# Patient Record
Sex: Female | Born: 1965
Health system: Southern US, Community
[De-identification: ages and names within clinical notes are randomized; demographics above are authoritative.]

## PROBLEM LIST (undated history)

## (undated) DIAGNOSIS — E039 Hypothyroidism, unspecified: Secondary | ICD-10-CM

## (undated) DIAGNOSIS — D860 Sarcoidosis of lung: Secondary | ICD-10-CM

## (undated) DIAGNOSIS — E785 Hyperlipidemia, unspecified: Secondary | ICD-10-CM

## (undated) DIAGNOSIS — C3411 Malignant neoplasm of upper lobe, right bronchus or lung: Secondary | ICD-10-CM

## (undated) DIAGNOSIS — T7840XA Allergy, unspecified, initial encounter: Secondary | ICD-10-CM

## (undated) DIAGNOSIS — Z8619 Personal history of other infectious and parasitic diseases: Secondary | ICD-10-CM

## (undated) DIAGNOSIS — I10 Essential (primary) hypertension: Secondary | ICD-10-CM

## (undated) HISTORY — DX: Essential (primary) hypertension: I10

## (undated) HISTORY — DX: Hyperlipidemia, unspecified: E78.5

## (undated) HISTORY — DX: Personal history of other infectious and parasitic diseases: Z86.19

## (undated) HISTORY — DX: Hypothyroidism, unspecified: E03.9

## (undated) HISTORY — DX: Allergy, unspecified, initial encounter: T78.40XA

## (undated) HISTORY — DX: Other disorders of bilirubin metabolism: E80.6

## (undated) HISTORY — DX: Malignant neoplasm of upper lobe, right bronchus or lung: C34.11

---

## 1996-08-02 HISTORY — PX: REFRACTIVE SURGERY: SHX103

## 1998-10-30 ENCOUNTER — Other Ambulatory Visit: Admission: RE | Admit: 1998-10-30 | Discharge: 1998-10-30 | Payer: Self-pay | Admitting: Obstetrics and Gynecology

## 1999-06-02 ENCOUNTER — Other Ambulatory Visit: Admission: RE | Admit: 1999-06-02 | Discharge: 1999-06-02 | Payer: Self-pay | Admitting: Obstetrics and Gynecology

## 1999-08-03 HISTORY — PX: TUBAL LIGATION: SHX77

## 1999-11-28 ENCOUNTER — Encounter (INDEPENDENT_AMBULATORY_CARE_PROVIDER_SITE_OTHER): Payer: Self-pay

## 1999-11-28 ENCOUNTER — Inpatient Hospital Stay (HOSPITAL_COMMUNITY): Admission: AD | Admit: 1999-11-28 | Discharge: 1999-11-30 | Payer: Self-pay | Admitting: Obstetrics and Gynecology

## 1999-12-14 ENCOUNTER — Encounter: Admission: RE | Admit: 1999-12-14 | Discharge: 2000-03-13 | Payer: Self-pay | Admitting: Obstetrics and Gynecology

## 2000-01-04 ENCOUNTER — Other Ambulatory Visit: Admission: RE | Admit: 2000-01-04 | Discharge: 2000-01-04 | Payer: Self-pay | Admitting: Obstetrics and Gynecology

## 2000-07-12 ENCOUNTER — Other Ambulatory Visit: Admission: RE | Admit: 2000-07-12 | Discharge: 2000-07-12 | Payer: Self-pay | Admitting: Obstetrics and Gynecology

## 2001-02-15 ENCOUNTER — Other Ambulatory Visit: Admission: RE | Admit: 2001-02-15 | Discharge: 2001-02-15 | Payer: Self-pay | Admitting: Obstetrics and Gynecology

## 2001-08-11 ENCOUNTER — Other Ambulatory Visit: Admission: RE | Admit: 2001-08-11 | Discharge: 2001-08-11 | Payer: Self-pay | Admitting: Obstetrics and Gynecology

## 2002-03-12 ENCOUNTER — Other Ambulatory Visit: Admission: RE | Admit: 2002-03-12 | Discharge: 2002-03-12 | Payer: Self-pay | Admitting: Obstetrics and Gynecology

## 2003-01-11 ENCOUNTER — Observation Stay (HOSPITAL_COMMUNITY): Admission: RE | Admit: 2003-01-11 | Discharge: 2003-01-12 | Payer: Self-pay | Admitting: Internal Medicine

## 2003-02-28 ENCOUNTER — Ambulatory Visit (HOSPITAL_COMMUNITY): Admission: RE | Admit: 2003-02-28 | Discharge: 2003-02-28 | Payer: Self-pay | Admitting: Internal Medicine

## 2003-03-15 ENCOUNTER — Other Ambulatory Visit: Admission: RE | Admit: 2003-03-15 | Discharge: 2003-03-15 | Payer: Self-pay | Admitting: Obstetrics and Gynecology

## 2003-10-18 ENCOUNTER — Encounter: Admission: RE | Admit: 2003-10-18 | Discharge: 2003-10-18 | Payer: Self-pay | Admitting: Obstetrics and Gynecology

## 2004-04-17 ENCOUNTER — Other Ambulatory Visit: Admission: RE | Admit: 2004-04-17 | Discharge: 2004-04-17 | Payer: Self-pay | Admitting: Obstetrics and Gynecology

## 2005-05-21 ENCOUNTER — Other Ambulatory Visit: Admission: RE | Admit: 2005-05-21 | Discharge: 2005-05-21 | Payer: Self-pay | Admitting: Obstetrics and Gynecology

## 2009-01-30 HISTORY — PX: ENDOMETRIAL ABLATION W/ NOVASURE: SUR434

## 2009-02-20 ENCOUNTER — Ambulatory Visit (HOSPITAL_COMMUNITY): Admission: RE | Admit: 2009-02-20 | Discharge: 2009-02-20 | Payer: Self-pay | Admitting: Obstetrics and Gynecology

## 2009-02-20 ENCOUNTER — Encounter (INDEPENDENT_AMBULATORY_CARE_PROVIDER_SITE_OTHER): Payer: Self-pay | Admitting: Obstetrics and Gynecology

## 2010-11-08 LAB — CBC
Platelets: 248 10*3/uL (ref 150–400)
RBC: 4.79 MIL/uL (ref 3.87–5.11)
WBC: 8 10*3/uL (ref 4.0–10.5)

## 2010-11-08 LAB — HCG, SERUM, QUALITATIVE: Preg, Serum: NEGATIVE

## 2010-12-15 NOTE — H&P (Signed)
NAME:  Monica [name], Monica [name] NO.:  [id number]   MEDICAL RECORD NO.:  [id number]          PATIENT TYPE:  AMB   LOCATION:  SDC                           FACILITY:  [facility]   PHYSICIAN:  Juluis Mire, M.D.   DATE OF BIRTH:  03-21-1966   DATE OF ADMISSION:  02/20/2009  DATE OF DISCHARGE:                              HISTORY & PHYSICAL   The patient is a 45 year old, gravida 1, para 1, female presents for  hysteroscopy.   In relation to the present admission, the patient has had trouble with  increasing menstrual flow, this become extremely limiting.  She  underwent a saline infusion ultrasound in the office.  She had a small  endometrial polyp that was noted.  Otherwise, findings suggestive of  adenomyosis.  Both ovaries appeared to be normal.  We had discussed  various options for management of menorrhagia.  After discussion of  options, she would like to proceed with hysteroscopy to resect the polyp  and then a NovaSure ablation which she is admitted at the present time.  She has had a previous bilateral tubal ligation.   IN TERMS OF ALLERGIES:  No known drug allergies.   MEDICATIONS:  She is on Synthroid 75 mcg daily.   PAST MEDICAL HISTORY:  Usual childhood diseases.  No significant  sequelae.  She has had a previous vaginal delivery and bilateral tubal  ligation.   SOCIAL HISTORY:  No tobacco or alcohol use.   FAMILY HISTORY:  There is a family history of hepatitis and bone cancer  as well as breast cancer.   REVIEW OF SYSTEMS:  Noncontributory.   PHYSICAL EXAMINATION:  VITAL SIGNS:  Stable.  The patient is afebrile.  HEENT: The patient is normocephalic.  Pupils are equal, round, reactive  to light and accommodation.  Extraocular movements were intact.  Sclerae  and conjunctivae are clear.  Oropharynx is clear.  NECK:  Without thyromegaly.  BREASTS:  Not examined.  LUNGS:  Clear.  CARDIOVASCULAR:  Regular rate without murmurs or gallops.  ABDOMEN:  Benign.   No mass, organomegaly, or tenderness.  PELVIC:  Normal external genitalia.  Vaginal mucosa is clear.  Cervix  unremarkable, usual size, shape, and contour.  Adnexa free of mass or  tenderness.  EXTREMITIES:  Trace edema.   IMPRESSION:  1. Menorrhagia.  2. Small endometrial polyp.  3. Hypothyroidism.   PLAN:  After discussion of options, the patient now presents for  hysteroscopy, we will remove the polyp and proceed with NovaSure  ablation.  The risk of procedure have been discussed including the risk  of infection.  Risk of hemorrhage that could require transfusion with  the risk of AIDS or hepatitis.  Excessive bleeding could require  hysterectomy.  There is a risk of perforation lead to injury to adjacent  organs requiring further exploratory surgery.  Risk of deep venous  thrombosis and pulmonary embolus with the NovaSure ablation obvious or  major concern would be perforation and injury to adjacent organs as  listed above.  Success rates of 80% are quoted.  The patient understand  indications, risks, and alternatives.  Juluis Mire, M.D.  Electronically Signed     JSM/MEDQ  D:  02/20/2009  T:  02/20/2009  Job:  [id number]

## 2010-12-15 NOTE — Op Note (Signed)
Monica [name], Monica [name]             ACCOUNT NO.:  [id number]   MEDICAL RECORD NO.:  [id number]          PATIENT TYPE:  AMB   LOCATION:  SDC                           FACILITY:  [facility]   PHYSICIAN:  Juluis Mire, M.D.   DATE OF BIRTH:  1965/10/13   DATE OF PROCEDURE:  DATE OF DISCHARGE:                               OPERATIVE REPORT   PREOPERATIVE DIAGNOSES:  Menorrhagia.  Endometrial polyp.   POSTOPERATIVE DIAGNOSES:  Menorrhagia.  Endometrial polyp.   PROCEDURES:  Paracervical block.  Hysteroscopy with resection of polyp,  endometrial curettings.  NovaSure ablation.   SURGEON:  Juluis Mire, MD.   ANESTHESIA:  General and paracervical block.   ESTIMATED BLOOD LOSS:  Minimal.   PACKS AND DRAINS:  None.   INTRAOPERATIVE BLOOD PLACED:  None.   COMPLICATIONS:  None.   INDICATIONS:  Dictated in the history and physical.   PROCEDURE IN DETAILS:  The patient was taken to the OR, placed supine  position.  After satisfactory level of general anesthesia was obtained,  the patient was placed in dorsal lithotomy position using the Allen  stirrups.  The perineum and vagina were cleansed out with Betadine and  draped in sterile field.  Speculum was placed in the vaginal vault.  The  cervix was grasped with single-tooth tenaculum.  Paracervical block with  1% Nesacaine was instituted.  Uterus sounded to 8 cm.  Endocervical  length was 3.5 cm, giving [place] a cavity length of 4.5 cm.  Hysteroscope  was then introduced.  Visualization revealed a posterior wall polyp.  This was resected.  We kept the resection fairly flush to the uterine  cavity.  Once the polyp was resected, this was sent to Pathology.  We  did do endometrial curettings.  The intrauterine cavity was then  cleansed out with lactated Ringer for 2 minutes.  We made sure all the  sorbitol had been removed.  NovaSure was brought in place, properly  deployed.  Cavity length was 4.5 cm.  We passed the CO2 patency test.  Ablation  was undertaken at a power setting of 111 per minute 30 seconds.  The NovaSure was removed intact.  Hysteroscopy revealed good ablation of  the endometrial cavity.  No signs of  perforation.  Single-tooth tenaculum and speculum then removed.  The  patient was taken out of the dorsal lithotomy position.  Once alert,  transferred to recovery room in good condition.  Sponges, instrument,  and needle count reported as correct by circulating nurse.      Juluis Mire, M.D.  Electronically Signed     JSM/MEDQ  D:  02/20/2009  T:  02/20/2009  Job:  [id number]

## 2010-12-18 NOTE — H&P (Signed)
NAMEGEORGENE, KOPPER NO.:  [id number]   MEDICAL RECORD NO.:  [id number]                   PATIENT TYPE:  OBV   LOCATION:  5709                                 FACILITY:  [facility]   PHYSICIAN:  Juline Patch, M.D.                  DATE OF BIRTH:  [birth year]   DATE OF ADMISSION:  01/11/2003  DATE OF DISCHARGE:  01/12/2003                                HISTORY & PHYSICAL   HISTORY OF PRESENT ILLNESS:  This is a 45 year old white female who comes in  to my office complaining of bilateral feet swelling and chest  pressure/tightness with some exertion.  No radiation.  No diaphoresis.  She  also felt some subsequent shortness of breath and nausea.  She says that  when she takes a deep breath she complains of this chest discomfort.  The  patient was seen in my office approximately three days ago and was put on  Lasix for leg swelling.  She reportedly had had negative blood work at her  previous physician's office.  She was scheduled for an echocardiogram.   PAST MEDICAL HISTORY:  None.   MEDICATIONS:  Hydrochlorothiazide/triamterene once a day.   PAST SURGICAL HISTORY:  Vaginal delivery, bilateral tubal ligation.   SOCIAL HISTORY:  She does not smoke or drink alcohol.  She is married with  one child.   FAMILY HISTORY:  Mother had bone cancer and questionable breast cancer at  age 65.   REVIEW OF SYSTEMS:  Denies any fever, weight loss, night sweats, joint pain,  palpitations, heartburn, blood in her stools, black stools, increasing  frequency, rash or skin lesions, depression, anxiety, panic attacks, heat or  cold intolerance.   PHYSICAL EXAMINATION:  VITAL SIGNS:  Blood pressure was 140/90, pulse 96.  She was afebrile.  HEENT:  Pupils equal, round and reactive.  __________ is clear.  Throat  clear.  NECK:  No lymphadenopathy.  LUNGS:  Clear.  No wheezing or rales.  HEART:  S1 and S2.  ABDOMEN:  Soft, nontender.  EXTREMITIES:  Bilateral 1+ ankle  edema with erythema around the malleolus.  No evidence of calf tenderness.   EKG shows normal sinus rhythm with deep S and Q wave inversion in lead III.  She is 97% on room air.   ASSESSMENT AND PLAN:  Because of her history of leg swelling and shortness  of breath on exertion and suspicious EKG, will send her for a spiral CT, and  admit her for observation to remove this leg swelling and to get to the  bottom of it.  She was started on Lasix IV, as she failed outpatient  therapy.  Will repeat some of her blood work and give her one shot of  Lovenox until her spiral CT is done.  Juline Patch, M.D.   RP/MEDQ  D:  01/14/2003  T:  01/14/2003  Job:  [id number]

## 2010-12-18 NOTE — Op Note (Signed)
[facility]  Patient:    Monica [name], Monica [name]                      MRN: [id number] Adm. Date:  [id number] Attending:  Trevor Iha                           Operative Report  PREOPERATIVE DIAGNOSIS:       Requests permanent sterilization.  POSTOPERATIVE DIAGNOSIS:      Requests permanent sterilization.  PROCEDURE:                    Modified [place] postpartum tubal ligation.  SURGEON:                      Duke Salvia. Marcelle Overlie, M.D.  ANESTHESIA:                   Epidural.  COMPLICATIONS:                None.  DRAINS:                       In-and-out catheter.  BLOOD LOSS:                   Less than 5 cc.  PROCEDURE AND FINDINGS:       Patient was taken to the operating room and after an adequate level of epidural anesthesia was obtained, patient supine, the abdomen was prepped and draped in the usual manner for sterile abdominal procedures.  A 2-cm subumbilical incision was made and carried down individually to the fascia and peritoneum without difficulty.  Army-Navy retractors were positioned.  Babcock clamps were then used to trace the right tube from the cornu to the fimbriated nd. This was regrasped at mid-segment.  A 0 plain suture was then tied around each nd of a mid-segment knuckle of tube, which was then excised, cut ends cauterized with a Bovie and a 2-0 silk suture placed around the proximal segment; the exact same repeated on the opposite side after carefully identifying the tube.  Prior to closure, sponge, needle and instrument counts were reported as correct x 2. The operative sites were hemostatic on either side.  Fascia was closed with a running 2-0 Dexon suture; 4-0 Dexon and subcuticular on the skin.  She tolerated this well and went to recovery room in good condition. DD:  11/29/99 TD:  11/30/99 Job: [id number]

## 2010-12-18 NOTE — Discharge Summary (Signed)
   NAMETABOR, BARTRAM NO.:  [id number]   MEDICAL RECORD NO.:  [id number]                   PATIENT TYPE:  OBV   LOCATION:  5709                                 FACILITY:  [facility]   PHYSICIAN:  Juline Patch, M.D.                  DATE OF BIRTH:  1965-10-08   DATE OF ADMISSION:  01/11/2003  DATE OF DISCHARGE:  01/12/2003                                 DISCHARGE SUMMARY   DISCHARGE DIAGNOSIS:  Bilateral hilar adenopathy, questionable sarcoidosis.   HISTORY:  This 45 year old patient was admitted for bilateral leg swelling  and increasing shortness of breath.  She had a high resolution CT which  showed mediastinal left hilar adenopathy with questionable right hilar  adenopathy and enlarged lymph nodes also in the left hilum.  In the anterior  mediastinum, there is a largest node measuring 125 x 2.8.  The patient was  given IV Lasix and antibiotics during her 24-hour observation and improved  dramatically with her swelling gone as well as erythema.  She was seen by  pulmonary consult who recommended that the patient have a followup CT in 4-6  weeks and be seen by a rheumatologist.  There was no followup scheduled by  the pulmonologist.  The patient was comfortable going home.  She was sent  home with prednisone taper and Keflex.   FOLLOW UP:  Dr. Ricki Miller in one week.   DISPOSITION:  Home.   CONDITION ON DISCHARGE:  Fair.                                               Juline Patch, M.D.    RP/MEDQ  D:  01/14/2003  T:  01/14/2003  Job:  [id number]

## 2013-12-10 ENCOUNTER — Encounter (HOSPITAL_COMMUNITY): Payer: Self-pay | Admitting: Emergency Medicine

## 2013-12-10 ENCOUNTER — Emergency Department (HOSPITAL_COMMUNITY)
Admission: EM | Admit: 2013-12-10 | Discharge: 2013-12-10 | Disposition: A | Discharge status: Home / Self Care | Payer: BC Managed Care – PPO | Attending: Emergency Medicine | Admitting: Emergency Medicine

## 2013-12-10 DIAGNOSIS — M549 Dorsalgia, unspecified: Secondary | ICD-10-CM

## 2013-12-10 DIAGNOSIS — Z3202 Encounter for pregnancy test, result negative: Secondary | ICD-10-CM | POA: Insufficient documentation

## 2013-12-10 DIAGNOSIS — R110 Nausea: Secondary | ICD-10-CM | POA: Insufficient documentation

## 2013-12-10 LAB — URINALYSIS, ROUTINE W REFLEX MICROSCOPIC
BILIRUBIN URINE: NEGATIVE
Glucose, UA: NEGATIVE mg/dL
Ketones, ur: NEGATIVE mg/dL
Nitrite: NEGATIVE
PROTEIN: NEGATIVE mg/dL
Specific Gravity, Urine: 1.013 (ref 1.005–1.030)
UROBILINOGEN UA: 0.2 mg/dL (ref 0.0–1.0)
pH: 5.5 (ref 5.0–8.0)

## 2013-12-10 LAB — COMPREHENSIVE METABOLIC PANEL
ALT: 10 U/L (ref 0–35)
AST: 13 U/L (ref 0–37)
Albumin: 4 g/dL (ref 3.5–5.2)
Alkaline Phosphatase: 62 U/L (ref 39–117)
BILIRUBIN TOTAL: 1.1 mg/dL (ref 0.3–1.2)
BUN: 16 mg/dL (ref 6–23)
CHLORIDE: 104 meq/L (ref 96–112)
CO2: 23 meq/L (ref 19–32)
CREATININE: 0.64 mg/dL (ref 0.50–1.10)
Calcium: 9.2 mg/dL (ref 8.4–10.5)
GFR calc Af Amer: >90 mL/min (ref >90)
GLUCOSE: 104 mg/dL — AB (ref 70–99)
Potassium: 4.1 mEq/L (ref 3.7–5.3)
SODIUM: 141 meq/L (ref 137–147)
Total Protein: 7.2 g/dL (ref 6.0–8.3)

## 2013-12-10 LAB — CBC WITH DIFFERENTIAL/PLATELET
BASOS ABS: 0 10*3/uL (ref 0.0–0.1)
Basophils Relative: 0 % (ref 0–1)
Eosinophils Absolute: 0.2 10*3/uL (ref 0.0–0.7)
Eosinophils Relative: 3 % (ref 0–5)
HEMATOCRIT: 43.7 % (ref 36.0–46.0)
Hemoglobin: 15 g/dL (ref 12.0–15.0)
LYMPHS ABS: 1.4 10*3/uL (ref 0.7–4.0)
LYMPHS PCT: 23 % (ref 12–46)
MCH: 28.5 pg (ref 26.0–34.0)
MCHC: 34.3 g/dL (ref 30.0–36.0)
MCV: 83.1 fL (ref 78.0–100.0)
MONO ABS: 0.3 10*3/uL (ref 0.1–1.0)
Monocytes Relative: 5 % (ref 3–12)
NEUTROS ABS: 4.2 10*3/uL (ref 1.7–7.7)
Neutrophils Relative %: 69 % (ref 43–77)
Platelets: 242 10*3/uL (ref 150–400)
RBC: 5.26 MIL/uL — AB (ref 3.87–5.11)
RDW: 13 % (ref 11.5–15.5)
WBC: 6 10*3/uL (ref 4.0–10.5)

## 2013-12-10 LAB — POC URINE PREG, ED: Preg Test, Ur: NEGATIVE

## 2013-12-10 LAB — URINE MICROSCOPIC-ADD ON

## 2013-12-10 MED ORDER — ONDANSETRON 4 MG PO TBDP
8.0000 mg | ORAL_TABLET | Freq: Once | ORAL | Status: AC
  Administered 2013-12-10: 8 mg via ORAL
  Filled 2013-12-10: qty 2

## 2013-12-10 MED ORDER — IBUPROFEN 800 MG PO TABS
800.0000 mg | ORAL_TABLET | Freq: Once | ORAL | Status: AC
  Administered 2013-12-10: 800 mg via ORAL
  Filled 2013-12-10: qty 1

## 2013-12-10 NOTE — ED Provider Notes (Signed)
Medical screening examination/treatment/procedure(s) were performed by non-physician practitioner and as supervising physician I was immediately available for consultation/collaboration.   EKG Interpretation None        Ezequiel Essex, MD 12/10/13 [phone]

## 2013-12-10 NOTE — Discharge Instructions (Signed)
Back Pain, Adult Low back pain is very common. About 1 in 5 people have back pain.The cause of low back pain is rarely dangerous. The pain often gets better over time.About half of people with a sudden onset of back pain feel better in just 2 weeks. About 8 in 10 people feel better by 6 weeks.  CAUSES Some common causes of back pain include:  Strain of the muscles or ligaments supporting the spine.  Wear and tear (degeneration) of the spinal discs.  Arthritis.  Direct injury to the back. DIAGNOSIS Most of the time, the direct cause of low back pain is not known.However, back pain can be treated effectively even when the exact cause of the pain is unknown.Answering your caregiver's questions about your overall health and symptoms is one of the most accurate ways to make sure the cause of your pain is not dangerous. If your caregiver needs more information, he or she may order lab work or imaging tests (X-rays or MRIs).However, even if imaging tests show changes in your back, this usually does not require surgery. HOME CARE INSTRUCTIONS For many people, back pain returns.Since low back pain is rarely dangerous, it is often a condition that people can learn to manageon their own.   Remain active. It is stressful on the back to sit or stand in one place. Do not sit, drive, or stand in one place for more than 30 minutes at a time. Take short walks on level surfaces as soon as pain allows.Try to increase the length of time you walk each day.  Do not stay in bed.Resting more than 1 or 2 days can delay your recovery.  Do not avoid exercise or work.Your body is made to move.It is not dangerous to be active, even though your back may hurt.Your back will likely heal faster if you return to being active before your pain is gone.  Pay attention to your body when you bend and lift. Many people have less discomfortwhen lifting if they bend their knees, keep the load close to their bodies,and  avoid twisting. Often, the most comfortable positions are those that put less stress on your recovering back.  Find a comfortable position to sleep. Use a firm mattress and lie on your side with your knees slightly bent. If you lie on your back, put a pillow under your knees.  Only take over-the-counter or prescription medicines as directed by your caregiver. Over-the-counter medicines to reduce pain and inflammation are often the most helpful.Your caregiver may prescribe muscle relaxant drugs.These medicines help dull your pain so you can more quickly return to your normal activities and healthy exercise.  Put ice on the injured area.  Put ice in a plastic bag.  Place a towel between your skin and the bag.  Leave the ice on for 15-20 minutes, 03-04 times a day for the first 2 to 3 days. After that, ice and heat may be alternated to reduce pain and spasms.  Ask your caregiver about trying back exercises and gentle massage. This may be of some benefit.  Avoid feeling anxious or stressed.Stress increases muscle tension and can worsen back pain.It is important to recognize when you are anxious or stressed and learn ways to manage it.Exercise is a great option. SEEK MEDICAL CARE IF:  You have pain that is not relieved with rest or medicine.  You have pain that does not improve in 1 week.  You have new symptoms.  You are generally not feeling well. SEEK   IMMEDIATE MEDICAL CARE IF:   You have pain that radiates from your back into your legs.  You develop new bowel or bladder control problems.  You have unusual weakness or numbness in your arms or legs.  You develop nausea or vomiting.  You develop abdominal pain.  You feel faint. Document Released: 07/19/2005 Document Revised: 01/18/2012 Document Reviewed: 12/07/2010 Exit[employer] Patient Information 2014 [employer].  

## 2013-12-10 NOTE — ED Notes (Signed)
PA at bedside.

## 2013-12-10 NOTE — ED Notes (Signed)
48 yo female with Right Back/Flank pain that radiates around to the front. Reports having back pain before but not like this. No HX of kidney stones. Nauseous with pain.

## 2013-12-10 NOTE — ED Provider Notes (Signed)
CSN: [id number]     Arrival date & time 12/10/13  0710 History   First MD Initiated Contact with Patient 12/10/13 [phone]     Chief Complaint  Patient presents with  . Back Pain    Right Sided     (Consider location/radiation/quality/duration/timing/severity/associated sxs/prior Treatment) HPI Comments: Patient presents to the ED with a chief complaint of back pain.  She states that the pain has been ongoing for the past couple of weeks, but has recently worsened.  She normally takes advil for pain.  Denies bowel or bladder incontinence, saddle anesthesia, difficulty walking, ataxia, fevers or chills, and hematuria.  No known MOI.  States that she wants to get in to see an orthopedic, but wanted to make certain it wasn't her kidneys or gallbladder.  She works as a Art therapist.  The history is provided by the patient. No language interpreter was used.    History reviewed. No pertinent past medical history. No past surgical history on file. No family history on file. History  Substance Use Topics  . Smoking status: Not on file  . Smokeless tobacco: Not on file  . Alcohol Use: Not on file   OB History   Grav Para Term Preterm Abortions TAB SAB Ect Mult Living                 Review of Systems  Constitutional: Negative for fever and chills.  Respiratory: Negative for shortness of breath.   Cardiovascular: Negative for chest pain.  Gastrointestinal: Positive for nausea. Negative for vomiting, diarrhea and constipation.  Genitourinary: Negative for dysuria.  Musculoskeletal: Positive for back pain.      Allergies  Review of patient's allergies indicates not on file.  Home Medications   Prior to Admission medications   Not on File   There were no vitals taken for this visit. Physical Exam  Nursing note and vitals reviewed. Constitutional: She is oriented to person, place, and time. She appears well-developed and well-nourished. No distress.  HENT:  Head: Normocephalic  and atraumatic.  Eyes: Conjunctivae and EOM are normal. Pupils are equal, round, and reactive to light. Right eye exhibits no discharge. Left eye exhibits no discharge. No scleral icterus.  Neck: Normal range of motion. Neck supple. No tracheal deviation present.  Cardiovascular: Normal rate, regular rhythm and normal heart sounds.  Exam reveals no gallop and no friction rub.   No murmur heard. Pulmonary/Chest: Effort normal and breath sounds normal. No respiratory distress. She has no wheezes. She has no rales. She exhibits no tenderness.  Abdominal: Soft. She exhibits no distension and no mass. There is no tenderness. There is no rebound and no guarding.  No focal abdominal tenderness, no RLQ tenderness or pain at McBurney's point, no RUQ tenderness or Murphy's sign, no left-sided abdominal tenderness, no fluid wave, or signs of peritonitis   Musculoskeletal: Normal range of motion. She exhibits no edema and no tenderness.  No paraspinal muscles tenderness, no bony tenderness, step-offs, or gross abnormality or deformity of spine, patient is able to ambulate, moves all extremities  Bilateral great toe extension intact Bilateral plantar/dorsiflexion intact  Neurological: She is alert and oriented to person, place, and time. She has normal reflexes.  Sensation and strength intact bilaterally Symmetrical reflexes  Skin: Skin is warm and dry. She is not diaphoretic.  Psychiatric: She has a normal mood and affect. Her behavior is normal. Judgment and thought content normal.    ED Course  Procedures (including critical care time) Labs Review  Labs Reviewed - No data to display  Imaging Review No results found.   EKG Interpretation None      MDM   Final diagnoses:  None    Patient with back pain. Suspect musculoskeletal, however patient is concerned about kidney and gallbladder.  Will check labs.    Labs are reassuring.  No RUQ pain.  Will culture urine, but doubt infection.  No  dysuria. Patient states that she has had some constipation.  I recommend increasing fluids and high fiber diet. Abdomen is benign on exam.  Will give orthopedic referral.  Patient appears comfortable.  9:06 AM Patient's pain is 0/10.  Afebrile.  Benign abdomen.  Will discharge to home with orthopedic follow-up.  Filed Vitals:   12/10/13 0719  BP: 164/91  Pulse: 93  Temp: 97.9 F (36.6 C)  Resp: [address], [place] 12/10/13 [phone]

## 2013-12-11 LAB — URINE CULTURE

## 2014-02-18 ENCOUNTER — Other Ambulatory Visit: Payer: Self-pay | Admitting: Internal Medicine

## 2014-02-18 DIAGNOSIS — R1903 Right lower quadrant abdominal swelling, mass and lump: Secondary | ICD-10-CM

## 2014-02-20 ENCOUNTER — Other Ambulatory Visit: Payer: BC Managed Care – PPO

## 2014-02-21 ENCOUNTER — Ambulatory Visit
Admission: RE | Admit: 2014-02-21 | Discharge: 2014-02-21 | Disposition: A | Discharge status: Home / Self Care | Payer: BC Managed Care – PPO | Source: Ambulatory Visit | Attending: Internal Medicine | Admitting: Internal Medicine

## 2014-02-21 DIAGNOSIS — R1903 Right lower quadrant abdominal swelling, mass and lump: Secondary | ICD-10-CM

## 2014-07-12 ENCOUNTER — Other Ambulatory Visit: Payer: Self-pay | Admitting: Obstetrics and Gynecology

## 2014-07-15 LAB — CYTOLOGY - PAP

## 2014-10-10 ENCOUNTER — Ambulatory Visit (INDEPENDENT_AMBULATORY_CARE_PROVIDER_SITE_OTHER): Payer: BLUE CROSS/BLUE SHIELD | Admitting: Physician Assistant

## 2014-10-10 ENCOUNTER — Ambulatory Visit (INDEPENDENT_AMBULATORY_CARE_PROVIDER_SITE_OTHER): Payer: BLUE CROSS/BLUE SHIELD

## 2014-10-10 VITALS — BP 140/88 | HR 113 | Temp 99.1°F | Resp 19 | Ht 60.5 in | Wt 192.8 lb

## 2014-10-10 DIAGNOSIS — R05 Cough: Secondary | ICD-10-CM

## 2014-10-10 DIAGNOSIS — E039 Hypothyroidism, unspecified: Secondary | ICD-10-CM | POA: Insufficient documentation

## 2014-10-10 DIAGNOSIS — R053 Chronic cough: Secondary | ICD-10-CM

## 2014-10-10 DIAGNOSIS — J019 Acute sinusitis, unspecified: Secondary | ICD-10-CM

## 2014-10-10 DIAGNOSIS — Z862 Personal history of diseases of the blood and blood-forming organs and certain disorders involving the immune mechanism: Secondary | ICD-10-CM | POA: Insufficient documentation

## 2014-10-10 LAB — POCT CBC
GRANULOCYTE PERCENT: 79.1 % (ref 37–80)
HCT, POC: 41.1 % (ref 37.7–47.9)
HEMOGLOBIN: 13.2 g/dL (ref 12.2–16.2)
Lymph, poc: 1.1 (ref 0.6–3.4)
MCH: 27.3 pg (ref 27–31.2)
MCHC: 32.3 g/dL (ref 31.8–35.4)
MCV: 84.5 fL (ref 80–97)
MID (cbc): 0.6 (ref 0–0.9)
MPV: 7.4 fL (ref 0–99.8)
PLATELET COUNT, POC: 237 10*3/uL (ref 142–424)
POC GRANULOCYTE: 6.3 (ref 2–6.9)
POC LYMPH PERCENT: 13.4 %L (ref 10–50)
POC MID %: 7.5 %M (ref 0–12)
RBC: 4.86 M/uL (ref 4.04–5.48)
RDW, POC: 13.4 %
WBC: 8 10*3/uL (ref 4.6–10.2)

## 2014-10-10 MED ORDER — LEVOFLOXACIN 500 MG PO TABS: 500.0000 mg | ORAL_TABLET | Freq: Every day | ORAL | Status: DC

## 2014-10-10 MED ORDER — PREDNISONE 20 MG PO TABS: ORAL_TABLET | ORAL | Status: DC

## 2014-10-10 NOTE — Patient Instructions (Signed)
Take antibiotic once a day for 7 days. Take prednisone in the mornings for 3 days. Follow up with your PCP if your symptoms continue to not improve.

## 2014-10-10 NOTE — Progress Notes (Signed)
Subjective:    Patient ID: Monica [name], female    DOB: 11-05-1965, 49 y.o.   MRN: [id number]  HPI  This is a 49 year old female presenting with 4 weeks of cough. Went to see PCP and was dx'd with bronchitis. Was given codeine cough syrup and azithromycin. Finished the abx 2 weeks ago. Cough is now dry, was productive. Has had a few episodes of post-tussive emesis. Husband was sick at the same time but is better now. For the past 1 week has developed nasal congestion, sinus pressure and achy otalgia. Had fever and chills last night. Been trying sudafed, ibuprofen, robitussin and nasacort. No SOB, wheezing. Reports she was treated for sarcoidosis 11 years ago but no problems since. She is not a smoker. States she has problems with heartburn if she eats late at night, but otherwise no problems. Had seasonal allergies when younger, not as much trouble in adulthood.   Review of Systems  Constitutional: Positive for chills. Negative for fever.  HENT: Positive for congestion, ear pain and sinus pressure. Negative for sore throat.   Eyes: Negative for redness.  Respiratory: Positive for cough. Negative for shortness of breath and wheezing.   Gastrointestinal: Positive for vomiting. Negative for nausea, abdominal pain and diarrhea.  Skin: Negative for rash.  Allergic/Immunologic: Positive for environmental allergies (occasional).  Neurological: Negative for dizziness.  Hematological: Negative for adenopathy.  Psychiatric/Behavioral: Positive for sleep disturbance.    There are no active problems to display for this patient.  Prior to Admission medications   Medication Sig Start Date End Date Taking? Authorizing Provider  ibuprofen (ADVIL,MOTRIN) 200 MG tablet Take 600-800 mg by mouth every 6 (six) hours as needed for moderate pain.   Yes Historical Provider, MD  levothyroxine (SYNTHROID, LEVOTHROID) 75 MCG tablet Take 75 mcg by mouth daily before breakfast.   Yes Historical Provider, MD    Patient's social and family history were reviewed.  No Known Allergies     Objective:   Physical Exam  Constitutional: She is oriented to person, place, and time. She appears well-developed and well-nourished. No distress.  HENT:  Head: Normocephalic and atraumatic.  Right Ear: Hearing, tympanic membrane, external ear and ear canal normal.  Left Ear: Hearing, tympanic membrane, external ear and ear canal normal.  Nose: Right sinus exhibits no maxillary sinus tenderness and no frontal sinus tenderness. Left sinus exhibits maxillary sinus tenderness. Left sinus exhibits no frontal sinus tenderness.  Mouth/Throat: Uvula is midline, oropharynx is clear and moist and mucous membranes are normal.  Eyes: Conjunctivae and lids are normal. Right eye exhibits no discharge. Left eye exhibits no discharge. No scleral icterus.  Cardiovascular: Regular rhythm, normal heart sounds, intact distal pulses and normal pulses.  Tachycardia present.   No murmur heard. Pulmonary/Chest: Effort normal. No respiratory distress. She has no wheezes. She has rhonchi (scattered). She has no rales.  Musculoskeletal: Normal range of motion.  Lymphadenopathy:       Head (right side): No submental, no submandibular and no tonsillar adenopathy present.       Head (left side): No submental, no submandibular and no tonsillar adenopathy present.    She has no cervical adenopathy.  Neurological: She is alert and oriented to person, place, and time.  Skin: Skin is warm, dry and intact. No lesion and no rash noted.  Psychiatric: She has a normal mood and affect. Her speech is normal and behavior is normal. Thought content normal.   BP 140/88 mmHg  Pulse 113  Temp(Src) 99.1 F (37.3 C) (Oral)  Resp 19  Ht 5' 0.5" (1.537 m)  Wt 192 lb 12.8 oz (87.454 kg)  BMI 37.02 kg/m2  SpO2 98%  [facility] reading (PRIMARY) by  Dr. Linna Darner: right hilar prominence, otherwise negative.  Results for orders placed or performed in visit on  10/10/14  POCT CBC  Result Value Ref Range   WBC 8.0 4.6 - 10.2 K/uL   Lymph, poc 1.1 0.6 - 3.4   POC LYMPH PERCENT 13.4 10 - 50 %L   MID (cbc) 0.6 0 - 0.9   POC MID % 7.5 0 - 12 %M   POC Granulocyte 6.3 2 - 6.9   Granulocyte percent 79.1 37 - 80 %G   RBC 4.86 4.04 - 5.48 M/uL   Hemoglobin 13.2 12.2 - 16.2 g/dL   HCT, POC 41.1 37.7 - 47.9 %   MCV 84.5 80 - 97 fL   MCH, POC 27.3 27 - 31.2 pg   MCHC 32.3 31.8 - 35.4 g/dL   RDW, POC 13.4 %   Platelet Count, POC 237 142 - 424 K/uL   MPV 7.4 0 - 99.8 fL      Assessment & Plan:  1. Persistent cough 2. Acute sinusitis CBC wnl. Radiograph negative. Will treat with levaquin to cover sinus and lung pathogens d/t tachycardia and low-grade fever. Will also treat with short course of steroids to help reduce inflammation. If she continues to have symptoms after antibiotics she should follow up here or with her PCP. - POCT CBC - DG Chest 2 View; Future - levofloxacin (LEVAQUIN) 500 MG tablet; Take 1 tablet (500 mg total) by mouth daily.  Dispense: 7 tablet; Refill: 0 - predniSONE (DELTASONE) 20 MG tablet; Take 3 tabs po QAM x 3 days  Dispense: 9 tablet; Refill: 0   Benjaman Pott. Drenda Freeze, MHS [facility]  10/10/2014

## 2015-04-01 ENCOUNTER — Ambulatory Visit (INDEPENDENT_AMBULATORY_CARE_PROVIDER_SITE_OTHER): Payer: BLUE CROSS/BLUE SHIELD | Admitting: Family Medicine

## 2015-04-01 ENCOUNTER — Encounter (INDEPENDENT_AMBULATORY_CARE_PROVIDER_SITE_OTHER): Payer: Self-pay

## 2015-04-01 ENCOUNTER — Encounter: Payer: Self-pay | Admitting: Family Medicine

## 2015-04-01 ENCOUNTER — Ambulatory Visit: Payer: Self-pay | Admitting: Family Medicine

## 2015-04-01 VITALS — BP 136/84 | HR 88 | Temp 98.3°F | Ht 61.5 in | Wt 197.8 lb

## 2015-04-01 DIAGNOSIS — R5383 Other fatigue: Secondary | ICD-10-CM

## 2015-04-01 DIAGNOSIS — Z862 Personal history of diseases of the blood and blood-forming organs and certain disorders involving the immune mechanism: Secondary | ICD-10-CM

## 2015-04-01 DIAGNOSIS — Z1211 Encounter for screening for malignant neoplasm of colon: Secondary | ICD-10-CM

## 2015-04-01 DIAGNOSIS — Z0000 Encounter for general adult medical examination without abnormal findings: Secondary | ICD-10-CM | POA: Insufficient documentation

## 2015-04-01 DIAGNOSIS — E039 Hypothyroidism, unspecified: Secondary | ICD-10-CM

## 2015-04-01 DIAGNOSIS — E669 Obesity, unspecified: Secondary | ICD-10-CM | POA: Insufficient documentation

## 2015-04-01 DIAGNOSIS — E559 Vitamin D deficiency, unspecified: Secondary | ICD-10-CM | POA: Insufficient documentation

## 2015-04-01 MED ORDER — LEVOTHYROXINE SODIUM 150 MCG PO TABS: 150.0000 ug | ORAL_TABLET | Freq: Every day | ORAL | Status: DC

## 2015-04-01 NOTE — Progress Notes (Signed)
Pre visit review using our clinic review tool, if applicable. No additional management support is needed unless otherwise documented below in the visit note. 

## 2015-04-01 NOTE — Patient Instructions (Addendum)
Pass by lab for stool kit. Return at your convenience fasting for labs. Work on incorporating exercise into routine. Continue current thyroid medicine dose. We will request records from Dr Minna Antis.  Health Maintenance Adopting a healthy lifestyle and getting preventive care can go a long way to promote health and wellness. Talk with your health care provider about what schedule of regular examinations is right for you. This is a good chance for you to check in with your provider about disease prevention and staying healthy. In between checkups, there are plenty of things you can do on your own. Experts have done a lot of research about which lifestyle changes and preventive measures are most likely to keep you healthy. Ask your health care provider for more information. WEIGHT AND DIET  Eat a healthy diet  Be sure to include plenty of vegetables, fruits, low-fat dairy products, and lean protein.  Do not eat a lot of foods high in solid fats, added sugars, or salt.  Get regular exercise. This is one of the most important things you can do for your health.  Most adults should exercise for at least 150 minutes each week. The exercise should increase your heart rate and make you sweat (moderate-intensity exercise).  Most adults should also do strengthening exercises at least twice a week. This is in addition to the moderate-intensity exercise.  Maintain a healthy weight  Body mass index (BMI) is a measurement that can be used to identify possible weight problems. It estimates body fat based on height and weight. Your health care provider can help determine your BMI and help you achieve or maintain a healthy weight.  For females 91 years of age and older:   A BMI below 18.5 is considered underweight.  A BMI of 18.5 to 24.9 is normal.  A BMI of 25 to 29.9 is considered overweight.  A BMI of 30 and above is considered obese.  Watch levels of cholesterol and blood lipids  You should start  having your blood tested for lipids and cholesterol at 50 years of age, then have this test every 5 years.  You may need to have your cholesterol levels checked more often if:  Your lipid or cholesterol levels are high.  You are older than 50 years of age.  You are at high risk for heart disease.  CANCER SCREENING   Lung Cancer  Lung cancer screening is recommended for adults 32-63 years old who are at high risk for lung cancer because of a history of smoking.  A yearly low-dose CT scan of the lungs is recommended for people who:  Currently smoke.  Have quit within the past 15 years.  Have at least a 30-pack-year history of smoking. A pack year is smoking an average of one pack of cigarettes a day for 1 year.  Yearly screening should continue until it has been 15 years since you quit.  Yearly screening should stop if you develop a health problem that would prevent you from having lung cancer treatment.  Breast Cancer  Practice breast self-awareness. This means understanding how your breasts normally appear and feel.  It also means doing regular breast self-exams. Let your health care provider know about any changes, no matter how small.  If you are in your 20s or 30s, you should have a clinical breast exam (CBE) by a health care provider every 1-3 years as part of a regular health exam.  If you are 84 or older, have a CBE every year.  Also consider having a breast X-ray (mammogram) every year.  If you have a family history of breast cancer, talk to your health care provider about genetic screening.  If you are at high risk for breast cancer, talk to your health care provider about having an MRI and a mammogram every year.  Breast cancer gene (BRCA) assessment is recommended for women who have family members with BRCA-related cancers. BRCA-related cancers include:  Breast.  Ovarian.  Tubal.  Peritoneal cancers.  Results of the assessment will determine the need for  genetic counseling and BRCA1 and BRCA2 testing. Cervical Cancer Routine pelvic examinations to screen for cervical cancer are no longer recommended for nonpregnant women who are considered low risk for cancer of the pelvic organs (ovaries, uterus, and vagina) and who do not have symptoms. A pelvic examination may be necessary if you have symptoms including those associated with pelvic infections. Ask your health care provider if a screening pelvic exam is right for you.   The Pap test is the screening test for cervical cancer for women who are considered at risk.  If you had a hysterectomy for a problem that was not cancer or a condition that could lead to cancer, then you no longer need Pap tests.  If you are older than 65 years, and you have had normal Pap tests for the past 10 years, you no longer need to have Pap tests.  If you have had past treatment for cervical cancer or a condition that could lead to cancer, you need Pap tests and screening for cancer for at least 20 years after your treatment.  If you no longer get a Pap test, assess your risk factors if they change (such as having a new sexual partner). This can affect whether you should start being screened again.  Some women have medical problems that increase their chance of getting cervical cancer. If this is the case for you, your health care provider may recommend more frequent screening and Pap tests.  The human papillomavirus (HPV) test is another test that may be used for cervical cancer screening. The HPV test looks for the virus that can cause cell changes in the cervix. The cells collected during the Pap test can be tested for HPV.  The HPV test can be used to screen women 57 years of age and older. Getting tested for HPV can extend the interval between normal Pap tests from three to five years.  An HPV test also should be used to screen women of any age who have unclear Pap test results.  After 50 years of age, women  should have HPV testing as often as Pap tests.  Colorectal Cancer  This type of cancer can be detected and often prevented.  Routine colorectal cancer screening usually begins at 50 years of age and continues through 50 years of age.  Your health care provider may recommend screening at an earlier age if you have risk factors for colon cancer.  Your health care provider may also recommend using home test kits to check for hidden blood in the stool.  A small camera at the end of a tube can be used to examine your colon directly (sigmoidoscopy or colonoscopy). This is done to check for the earliest forms of colorectal cancer.  Routine screening usually begins at age 54.  Direct examination of the colon should be repeated every 5-10 years through 50 years of age. However, you may need to be screened more often if early forms  of precancerous polyps or small growths are found. Skin Cancer  Check your skin from head to toe regularly.  Tell your health care provider about any new moles or changes in moles, especially if there is a change in a mole's shape or color.  Also tell your health care provider if you have a mole that is larger than the size of a pencil eraser.  Always use sunscreen. Apply sunscreen liberally and repeatedly throughout the day.  Protect yourself by wearing long sleeves, pants, a wide-brimmed hat, and sunglasses whenever you are outside. HEART DISEASE, DIABETES, AND HIGH BLOOD PRESSURE   Have your blood pressure checked at least every 1-2 years. High blood pressure causes heart disease and increases the risk of stroke.  If you are between 70 years and 110 years old, ask your health care provider if you should take aspirin to prevent strokes.  Have regular diabetes screenings. This involves taking a blood sample to check your fasting blood sugar level.  If you are at a normal weight and have a low risk for diabetes, have this test once every three years after 50 years  of age.  If you are overweight and have a high risk for diabetes, consider being tested at a younger age or more often. PREVENTING INFECTION  Hepatitis B  If you have a higher risk for hepatitis B, you should be screened for this virus. You are considered at high risk for hepatitis B if:  You were born in a country where hepatitis B is common. Ask your health care provider which countries are considered high risk.  Your parents were born in a high-risk country, and you have not been immunized against hepatitis B (hepatitis B vaccine).  You have HIV or AIDS.  You use needles to inject street drugs.  You live with someone who has hepatitis B.  You have had sex with someone who has hepatitis B.  You get hemodialysis treatment.  You take certain medicines for conditions, including cancer, organ transplantation, and autoimmune conditions. Hepatitis C  Blood testing is recommended for:  Everyone born from [birth year] through 1966.  Anyone with known risk factors for hepatitis C. Sexually transmitted infections (STIs)  You should be screened for sexually transmitted infections (STIs) including gonorrhea and chlamydia if:  You are sexually active and are younger than 50 years of age.  You are older than 50 years of age and your health care provider tells you that you are at risk for this type of infection.  Your sexual activity has changed since you were last screened and you are at an increased risk for chlamydia or gonorrhea. Ask your health care provider if you are at risk.  If you do not have HIV, but are at risk, it may be recommended that you take a prescription medicine daily to prevent HIV infection. This is called pre-exposure prophylaxis (PrEP). You are considered at risk if:  You are sexually active and do not regularly use condoms or know the HIV status of your partner(s).  You take drugs by injection.  You are sexually active with a partner who has HIV. Talk with your  health care provider about whether you are at high risk of being infected with HIV. If you choose to begin PrEP, you should first be tested for HIV. You should then be tested every 3 months for as long as you are taking PrEP.  PREGNANCY   If you are premenopausal and you may become pregnant, ask your health  care provider about preconception counseling.  If you may become pregnant, take 400 to 800 micrograms (mcg) of folic acid every day.  If you want to prevent pregnancy, talk to your health care provider about birth control (contraception). OSTEOPOROSIS AND MENOPAUSE   Osteoporosis is a disease in which the bones lose minerals and strength with aging. This can result in serious bone fractures. Your risk for osteoporosis can be identified using a bone density scan.  If you are 64 years of age or older, or if you are at risk for osteoporosis and fractures, ask your health care provider if you should be screened.  Ask your health care provider whether you should take a calcium or vitamin D supplement to lower your risk for osteoporosis.  Menopause may have certain physical symptoms and risks.  Hormone replacement therapy may reduce some of these symptoms and risks. Talk to your health care provider about whether hormone replacement therapy is right for you.  HOME CARE INSTRUCTIONS   Schedule regular health, dental, and eye exams.  Stay current with your immunizations.   Do not use any tobacco products including cigarettes, chewing tobacco, or electronic cigarettes.  If you are pregnant, do not drink alcohol.  If you are breastfeeding, limit how much and how often you drink alcohol.  Limit alcohol intake to no more than 1 drink per day for nonpregnant women. One drink equals 12 ounces of beer, 5 ounces of wine, or 1 ounces of hard liquor.  Do not use street drugs.  Do not share needles.  Ask your health care provider for help if you need support or information about quitting  drugs.  Tell your health care provider if you often feel depressed.  Tell your health care provider if you have ever been abused or do not feel safe at home. Document Released: 02/01/2011 Document Revised: 12/03/2013 Document Reviewed: 06/20/2013 [facility] Patient Information 2015 [place], [place]. This information is not intended to replace advice given to you by your health care provider. Make sure you discuss any questions you have with your health care provider.

## 2015-04-01 NOTE — Assessment & Plan Note (Signed)
Overall stable.   

## 2015-04-01 NOTE — Assessment & Plan Note (Addendum)
Good control last check - will recheck when returns fasting. Pt taking 120mg 1/2 tab daily - discussed possible day to day variation with this route, pt desires to continue for now. $30 /mo cost.

## 2015-04-01 NOTE — Assessment & Plan Note (Signed)
Discussed healthy diet and lifestyle changes to affect sustainable weight loss  

## 2015-04-01 NOTE — Assessment & Plan Note (Signed)
Check next labs then will determine need for supplementation.

## 2015-04-01 NOTE — Assessment & Plan Note (Signed)
Preventative protocols reviewed and updated unless pt declined. Discussed healthy diet and lifestyle.  

## 2015-04-01 NOTE — Progress Notes (Signed)
BP 136/84 mmHg  Pulse 88  Temp(Src) 98.3 F (36.8 C) (Oral)  Ht 5' 1.5" (1.562 m)  Wt 197 lb 12 oz (89.699 kg)  BMI 36.76 kg/m2   CC: new pt to establsih  Subjective:    Patient ID: Monica [name], female    DOB: 02-19-1966, 49 y.o.   MRN: [id number]  HPI: Monica [name] is a 49 y.o. female presenting on 04/01/2015 for Establish Care   Prior saw Dr Minna Antis [facility].   Hypothyroid - dx 2003 and stable on levothyroxine 23mg daily. Has been taking 1565m dose cut in half.  Sarcoidosis - dx 2004 by pulmonologist. Presented with swelling and bruising of legs. Gets CXR yearly by prior PCP.   H/o vit D def.   Preventative: Last CPE 01/2015 Colon cancer screening - stool card negative at GYN.  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 Interim medical history since our last visit reviewed. Allergies and medications reviewed and updated. Current Outpatient Prescriptions on File Prior to Visit  Medication Sig  . ibuprofen (ADVIL,MOTRIN) 200 MG tablet Take 600-800 mg by mouth every 6 (six) hours as needed for moderate pain.   No current facility-administered medications on file prior to visit.    Review of Systems  Constitutional: Negative for fever, chills, activity change, appetite change, fatigue and unexpected weight change.  HENT: Negative for hearing loss.   Eyes: Negative for visual disturbance.  Respiratory: Negative for cough, chest tightness, shortness of breath and wheezing.   Cardiovascular: Negative for chest pain, palpitations and leg swelling.  Gastrointestinal: Negative for nausea,  vomiting, abdominal pain, diarrhea, constipation, blood in stool and abdominal distention.  Genitourinary: Negative for hematuria and difficulty urinating.  Musculoskeletal: Negative for myalgias, arthralgias and neck pain.  Skin: Negative for rash.  Neurological: Negative for dizziness, seizures, syncope and headaches.  Hematological: Negative for adenopathy. Does not bruise/bleed easily.  Psychiatric/Behavioral: Negative for dysphoric mood. The patient is not nervous/anxious.    Per HPI unless specifically indicated above     Objective:    BP 136/84 mmHg  Pulse 88  Temp(Src) 98.3 F (36.8 C) (Oral)  Ht 5' 1.5" (1.562 m)  Wt 197 lb 12 oz (89.699 kg)  BMI 36.76 kg/m2  Wt Readings from Last 3 Encounters:  04/01/15 197 lb 12 oz (89.699 kg)  10/10/14 192 lb 12.8 oz (87.454 kg)  12/10/13 185 lb (83.915 kg)    Physical Exam  Constitutional: She is oriented to person, place, and time. She appears well-developed and well-nourished. No distress.  HENT:  Head: Normocephalic and atraumatic.  Right Ear: Hearing, tympanic membrane, external ear and ear canal normal.  Left Ear: Hearing, tympanic membrane, external ear and ear canal normal.  Nose: Nose normal.  Mouth/Throat: Uvula is midline, oropharynx is clear and moist and mucous membranes are normal. No oropharyngeal exudate, posterior oropharyngeal edema or posterior oropharyngeal erythema.  Eyes: Conjunctivae and EOM are normal. Pupils are equal, round, and reactive to light. No scleral icterus.  Neck: Normal range of motion. Neck supple. No thyromegaly present.  Cardiovascular: Normal rate, regular rhythm, normal heart sounds and intact distal pulses.   No murmur heard. Pulses:  Radial pulses are 2+ on the right side, and 2+ on the left side.  Pulmonary/Chest: Effort normal and breath sounds normal. No respiratory distress. She has no wheezes. She has no rales.  breast - per GYN  Abdominal: Soft. Bowel sounds are normal. She  exhibits no distension and no mass. There is no tenderness. There is no rebound and no guarding.  Genitourinary:  GYN - per GYN  Musculoskeletal: Normal range of motion. She exhibits no edema.  Lymphadenopathy:    She has no cervical adenopathy.  Neurological: She is alert and oriented to person, place, and time.  CN grossly intact, station and gait intact  Skin: Skin is warm and dry. No rash noted.  Psychiatric: She has a normal mood and affect. Her behavior is normal. Judgment and thought content normal.  Nursing note and vitals reviewed.      Assessment & Plan:   Problem List Items Addressed This Visit    History of sarcoidosis    Overall stable      Hypothyroidism    Good control last check - will recheck when returns fasting. Pt taking 149mg 1/2 tab daily - discussed possible day to day variation with this route, pt desires to continue for now. $30 /mo cost.      Relevant Medications   levothyroxine (SYNTHROID, LEVOTHROID) 150 MCG tablet   Other Relevant Orders   TSH   Health maintenance examination - Primary    Preventative protocols reviewed and updated unless pt declined. Discussed healthy diet and lifestyle.       Vitamin D deficiency    Check next labs then will determine need for supplementation.      Relevant Orders   Vit D  25 hydroxy (rtn osteoporosis monitoring)   Obesity, Class II, BMI 35-39.9, no comorbidity    Discussed healthy diet and lifestyle changes to affect sustainable weight loss.      Relevant Orders   Lipid panel   Comprehensive metabolic panel    Other Visit Diagnoses    Special screening for malignant neoplasms, colon        Relevant Orders    Fecal occult blood, imunochemical    Other fatigue        Relevant Orders    Comprehensive metabolic panel    TSH    CBC with Differential/Platelet    Vitamin B12        Follow up plan: Return in about 1 year (around 03/31/2016), or as needed, for annual exam, prior fasting for blood  work.

## 2015-04-04 ENCOUNTER — Other Ambulatory Visit: Payer: Self-pay | Admitting: Family Medicine

## 2015-04-04 ENCOUNTER — Other Ambulatory Visit (INDEPENDENT_AMBULATORY_CARE_PROVIDER_SITE_OTHER): Payer: BLUE CROSS/BLUE SHIELD

## 2015-04-04 ENCOUNTER — Encounter: Payer: Self-pay | Admitting: *Deleted

## 2015-04-04 DIAGNOSIS — E559 Vitamin D deficiency, unspecified: Secondary | ICD-10-CM | POA: Diagnosis not present

## 2015-04-04 DIAGNOSIS — R5383 Other fatigue: Secondary | ICD-10-CM | POA: Diagnosis not present

## 2015-04-04 DIAGNOSIS — E039 Hypothyroidism, unspecified: Secondary | ICD-10-CM

## 2015-04-04 DIAGNOSIS — E669 Obesity, unspecified: Secondary | ICD-10-CM

## 2015-04-04 LAB — COMPREHENSIVE METABOLIC PANEL WITH GFR
ALT: 12 U/L (ref 0–35)
AST: 14 U/L (ref 0–37)
Albumin: 4.1 g/dL (ref 3.5–5.2)
Alkaline Phosphatase: 58 U/L (ref 39–117)
BUN: 14 mg/dL (ref 6–23)
CO2: 29 meq/L (ref 19–32)
Calcium: 9.1 mg/dL (ref 8.4–10.5)
Chloride: 106 meq/L (ref 96–112)
Creatinine, Ser: 0.6 mg/dL (ref 0.40–1.20)
GFR: 112.19 mL/min
Glucose, Bld: 89 mg/dL (ref 70–99)
Potassium: 4.7 meq/L (ref 3.5–5.1)
Sodium: 141 meq/L (ref 135–145)
Total Bilirubin: 2.1 mg/dL — ABNORMAL HIGH (ref 0.2–1.2)
Total Protein: 7 g/dL (ref 6.0–8.3)

## 2015-04-04 LAB — CBC WITH DIFFERENTIAL/PLATELET
BASOS ABS: 0 10*3/uL (ref 0.0–0.1)
Basophils Relative: 0.5 % (ref 0.0–3.0)
EOS ABS: 0.2 10*3/uL (ref 0.0–0.7)
Eosinophils Relative: 2.4 % (ref 0.0–5.0)
HCT: 40.6 % (ref 36.0–46.0)
Hemoglobin: 13.6 g/dL (ref 12.0–15.0)
LYMPHS ABS: 1.7 10*3/uL (ref 0.7–4.0)
Lymphocytes Relative: 22.2 % (ref 12.0–46.0)
MCHC: 33.5 g/dL (ref 30.0–36.0)
MCV: 82.9 fl (ref 78.0–100.0)
MONO ABS: 0.4 10*3/uL (ref 0.1–1.0)
Monocytes Relative: 5.3 % (ref 3.0–12.0)
NEUTROS ABS: 5.5 10*3/uL (ref 1.4–7.7)
Neutrophils Relative %: 69.6 % (ref 43.0–77.0)
PLATELETS: 263 10*3/uL (ref 150.0–400.0)
RBC: 4.9 Mil/uL (ref 3.87–5.11)
RDW: 13.5 % (ref 11.5–15.5)
WBC: 7.8 10*3/uL (ref 4.0–10.5)

## 2015-04-04 LAB — LIPID PANEL
CHOLESTEROL: 207 mg/dL — AB (ref 0–200)
HDL: 49.8 mg/dL (ref >39.00)
LDL Cholesterol: 143 mg/dL — ABNORMAL HIGH (ref 0–99)
NonHDL: 157.58
Total CHOL/HDL Ratio: 4
Triglycerides: 75 mg/dL (ref 0.0–149.0)
VLDL: 15 mg/dL (ref 0.0–40.0)

## 2015-04-04 LAB — VITAMIN D 25 HYDROXY (VIT D DEFICIENCY, FRACTURES): VITD: 21.17 ng/mL — ABNORMAL LOW (ref 30.00–100.00)

## 2015-04-04 LAB — VITAMIN B12: Vitamin B-12: 628 pg/mL (ref 211–911)

## 2015-04-04 LAB — TSH: TSH: 1.48 u[IU]/mL (ref 0.35–4.50)

## 2015-04-04 MED ORDER — VITAMIN D3 25 MCG (1000 UT) PO CAPS: 1.0000 | ORAL_CAPSULE | Freq: Every day | ORAL | Status: DC

## 2015-04-18 ENCOUNTER — Other Ambulatory Visit: Payer: BLUE CROSS/BLUE SHIELD

## 2015-04-18 DIAGNOSIS — Z1211 Encounter for screening for malignant neoplasm of colon: Secondary | ICD-10-CM

## 2015-04-18 LAB — FECAL OCCULT BLOOD, GUAIAC: FECAL OCCULT BLD: NEGATIVE

## 2015-04-18 LAB — FECAL OCCULT BLOOD, IMMUNOCHEMICAL: Fecal Occult Bld: NEGATIVE

## 2015-04-21 ENCOUNTER — Encounter: Payer: Self-pay | Admitting: *Deleted

## 2015-08-01 ENCOUNTER — Ambulatory Visit (INDEPENDENT_AMBULATORY_CARE_PROVIDER_SITE_OTHER): Payer: BLUE CROSS/BLUE SHIELD | Admitting: Family Medicine

## 2015-08-01 ENCOUNTER — Encounter: Payer: Self-pay | Admitting: Family Medicine

## 2015-08-01 VITALS — BP 126/84 | HR 90 | Temp 98.7°F | Ht 61.5 in | Wt 193.6 lb

## 2015-08-01 DIAGNOSIS — J0100 Acute maxillary sinusitis, unspecified: Secondary | ICD-10-CM | POA: Diagnosis not present

## 2015-08-01 MED ORDER — AMOXICILLIN-POT CLAVULANATE 875-125 MG PO TABS: 1.0000 | ORAL_TABLET | Freq: Two times a day (BID) | ORAL | Status: DC

## 2015-08-01 MED ORDER — BENZONATATE 200 MG PO CAPS: 200.0000 mg | ORAL_CAPSULE | Freq: Two times a day (BID) | ORAL | Status: DC | PRN

## 2015-08-01 NOTE — Progress Notes (Signed)
Patient ID: [name], female   DOB: 06/19/1966, 49 y.o.   MRN: [id number]  Monica Rumps, MD Phone: [phone]  Monica [name] is a 49 y.o. female who presents today for same-day visit.  Sinusitis: Patient notes starting on Sunday or Monday she developed a scratchy sore throat. This was followed by cough with minimal mucus production. Then had ache in her right ear. Over the last couple of days she's developed increased sinus congestion and pressure that is much worse today than previously. She notes the sinus pressure is severe. She is blowing yellowish mucus out of her nose. She had a temperature this morning 100F. She's taken ibuprofen and Sudafed for this. She denies chest pain or shortness of breath. Notes her husband just got over a sinus infection.  PMH: nonsmoker.   ROS see history of present illness  Objective  Physical Exam Filed Vitals:   08/01/15 1053  BP: 126/84  Pulse: 90  Temp: 98.7 F (37.1 C)    Physical Exam  Constitutional: She is well-developed, well-nourished, and in no distress.  HENT:  Head: Normocephalic and atraumatic.  Right Ear: External ear normal.  Left Ear: External ear normal.  Mild posterior oropharyngeal erythema, no tonsillar exudate or swelling, normal TMs bilaterally, tenderness to percussion of maxillary sinuses  Eyes: Conjunctivae are normal. Pupils are equal, round, and reactive to light.  Neck: Neck supple.  Cardiovascular: Normal rate, regular rhythm and normal heart sounds.  Exam reveals no gallop and no friction rub.   No murmur heard. Pulmonary/Chest: Effort normal and breath sounds normal. No respiratory distress. She has no wheezes. She has no rales.  Lymphadenopathy:    She has no cervical adenopathy.  Neurological: She is alert. Gait normal.  Skin: Skin is warm and dry. She is not diaphoretic.     Assessment/Plan: Please see individual problem list.  Sinusitis, acute, maxillary Symptoms most consistent with  bacterial sinusitis given severe sinus pressure and elevated temperature. Vital signs stable at this time. We will treat with Augmentin. Tessalon for cough. Can also take over-the-counter antihistamine. Given return precautions.    Meds ordered this encounter  Medications  . amoxicillin-clavulanate (AUGMENTIN) 875-125 MG tablet    Sig: Take 1 tablet by mouth 2 (two) times daily.    Dispense:  14 tablet    Refill:  0  . benzonatate (TESSALON) 200 MG capsule    Sig: Take 1 capsule (200 mg total) by mouth 2 (two) times daily as needed for cough.    Dispense:  20 capsule    Refill:  0    Dragon voice recognition software was used during the dictation process of this note. If any phrases or words seem inappropriate it is likely secondary to the translation process being inefficient.  Monica [name]

## 2015-08-01 NOTE — Patient Instructions (Signed)
Nice to meet you. You likely have a sinus infection given your fever. We will treat this with Augmentin. You should take probiotic while on this. You can take the Tessalon for cough. If you develop persistent fevers, cough productive of blood, shortness of breath, or chest pain, or any new or change in symptoms please seek medical attention.

## 2015-08-01 NOTE — Assessment & Plan Note (Addendum)
Symptoms most consistent with bacterial sinusitis given severe sinus pressure and elevated temperature. Vital signs stable at this time. We will treat with Augmentin. Tessalon for cough. Can also take over-the-counter antihistamine. Given return precautions.

## 2015-08-01 NOTE — Progress Notes (Signed)
Pre visit review using our clinic review tool, if applicable. No additional management support is needed unless otherwise documented below in the visit note. 

## 2015-09-03 HISTORY — PX: COLONOSCOPY: SHX174

## 2015-09-26 LAB — HM COLONOSCOPY

## 2016-02-10 DIAGNOSIS — D1800 Hemangioma unspecified site: Secondary | ICD-10-CM | POA: Diagnosis not present

## 2016-02-10 DIAGNOSIS — L0291 Cutaneous abscess, unspecified: Secondary | ICD-10-CM | POA: Diagnosis not present

## 2016-02-10 DIAGNOSIS — L720 Epidermal cyst: Secondary | ICD-10-CM | POA: Diagnosis not present

## 2016-02-10 DIAGNOSIS — Z1283 Encounter for screening for malignant neoplasm of skin: Secondary | ICD-10-CM | POA: Diagnosis not present

## 2016-02-10 DIAGNOSIS — L718 Other rosacea: Secondary | ICD-10-CM | POA: Diagnosis not present

## 2016-02-10 DIAGNOSIS — D229 Melanocytic nevi, unspecified: Secondary | ICD-10-CM | POA: Diagnosis not present

## 2016-05-07 DIAGNOSIS — Z23 Encounter for immunization: Secondary | ICD-10-CM | POA: Diagnosis not present

## 2016-05-14 ENCOUNTER — Other Ambulatory Visit: Payer: Self-pay

## 2016-05-14 MED ORDER — LEVOTHYROXINE SODIUM 150 MCG PO TABS: 150.0000 ug | ORAL_TABLET | Freq: Every day | ORAL | 0 refills | Status: DC

## 2016-05-14 NOTE — Telephone Encounter (Signed)
Pt left v/m requesting refill namebrand thyroid med to [facility] [address] [address]. Last annual 04/01/15. Pt has appt on 05/21/2016 for thyroid f/u. On med list thyroid med is not name brand.Please advise. Pt request cb.

## 2016-05-14 NOTE — Telephone Encounter (Signed)
Spoke with Hilliard Clark at [facility] wanting to verify pt was to get namebrand synthroid; advised to ck with pt because pt left v/m earlier that she was taking DAW and requested namebrand, Sean voiced understanding.

## 2016-05-14 NOTE — Telephone Encounter (Signed)
Synthroid sent in.

## 2016-05-21 ENCOUNTER — Ambulatory Visit (INDEPENDENT_AMBULATORY_CARE_PROVIDER_SITE_OTHER): Payer: BLUE CROSS/BLUE SHIELD | Admitting: Family Medicine

## 2016-05-21 ENCOUNTER — Encounter: Payer: Self-pay | Admitting: Family Medicine

## 2016-05-21 ENCOUNTER — Ambulatory Visit (INDEPENDENT_AMBULATORY_CARE_PROVIDER_SITE_OTHER)
Admission: RE | Admit: 2016-05-21 | Discharge: 2016-05-21 | Disposition: A | Discharge status: Home / Self Care | Payer: BLUE CROSS/BLUE SHIELD | Source: Ambulatory Visit | Attending: Family Medicine | Admitting: Family Medicine

## 2016-05-21 VITALS — BP 126/78 | HR 64 | Temp 98.5°F | Wt 194.0 lb

## 2016-05-21 DIAGNOSIS — Z862 Personal history of diseases of the blood and blood-forming organs and certain disorders involving the immune mechanism: Secondary | ICD-10-CM | POA: Diagnosis not present

## 2016-05-21 DIAGNOSIS — R0602 Shortness of breath: Secondary | ICD-10-CM | POA: Diagnosis not present

## 2016-05-21 DIAGNOSIS — E039 Hypothyroidism, unspecified: Secondary | ICD-10-CM | POA: Diagnosis not present

## 2016-05-21 DIAGNOSIS — E669 Obesity, unspecified: Secondary | ICD-10-CM

## 2016-05-21 DIAGNOSIS — R5382 Chronic fatigue, unspecified: Secondary | ICD-10-CM

## 2016-05-21 DIAGNOSIS — E559 Vitamin D deficiency, unspecified: Secondary | ICD-10-CM | POA: Diagnosis not present

## 2016-05-21 LAB — VITAMIN B12: VITAMIN B 12: 517 pg/mL (ref 211–911)

## 2016-05-21 LAB — LIPID PANEL
CHOLESTEROL: 210 mg/dL — AB (ref 0–200)
HDL: 57.3 mg/dL (ref >39.00)
LDL CALC: 136 mg/dL — AB (ref 0–99)
NonHDL: 153.06
TRIGLYCERIDES: 83 mg/dL (ref 0.0–149.0)
Total CHOL/HDL Ratio: 4
VLDL: 16.6 mg/dL (ref 0.0–40.0)

## 2016-05-21 LAB — CBC WITH DIFFERENTIAL/PLATELET
BASOS PCT: 0.3 % (ref 0.0–3.0)
Basophils Absolute: 0 10*3/uL (ref 0.0–0.1)
EOS PCT: 1.6 % (ref 0.0–5.0)
Eosinophils Absolute: 0.1 10*3/uL (ref 0.0–0.7)
HEMATOCRIT: 40.8 % (ref 36.0–46.0)
Hemoglobin: 13.9 g/dL (ref 12.0–15.0)
LYMPHS PCT: 24.5 % (ref 12.0–46.0)
Lymphs Abs: 1.8 10*3/uL (ref 0.7–4.0)
MCHC: 34 g/dL (ref 30.0–36.0)
MCV: 82.4 fl (ref 78.0–100.0)
MONOS PCT: 5.3 % (ref 3.0–12.0)
Monocytes Absolute: 0.4 10*3/uL (ref 0.1–1.0)
Neutro Abs: 5 10*3/uL (ref 1.4–7.7)
Neutrophils Relative %: 68.3 % (ref 43.0–77.0)
Platelets: 258 10*3/uL (ref 150.0–400.0)
RBC: 4.96 Mil/uL (ref 3.87–5.11)
RDW: 13.8 % (ref 11.5–15.5)
WBC: 7.4 10*3/uL (ref 4.0–10.5)

## 2016-05-21 LAB — COMPREHENSIVE METABOLIC PANEL
ALBUMIN: 4.3 g/dL (ref 3.5–5.2)
ALT: 13 U/L (ref 0–35)
AST: 16 U/L (ref 0–37)
Alkaline Phosphatase: 59 U/L (ref 39–117)
BILIRUBIN TOTAL: 2.2 mg/dL — AB (ref 0.2–1.2)
BUN: 13 mg/dL (ref 6–23)
CALCIUM: 9.6 mg/dL (ref 8.4–10.5)
CHLORIDE: 103 meq/L (ref 96–112)
CO2: 29 mEq/L (ref 19–32)
CREATININE: 0.63 mg/dL (ref 0.40–1.20)
GFR: 105.57 mL/min (ref >60.00)
GLUCOSE: 86 mg/dL (ref 70–99)
POTASSIUM: 4.5 meq/L (ref 3.5–5.1)
Sodium: 138 mEq/L (ref 135–145)
Total Protein: 7.2 g/dL (ref 6.0–8.3)

## 2016-05-21 LAB — VITAMIN D 25 HYDROXY (VIT D DEFICIENCY, FRACTURES): VITD: 22.51 ng/mL — AB (ref 30.00–100.00)

## 2016-05-21 LAB — TSH: TSH: 1.96 u[IU]/mL (ref 0.35–4.50)

## 2016-05-21 MED ORDER — LEVOTHYROXINE SODIUM 150 MCG PO TABS: 150.0000 ug | ORAL_TABLET | Freq: Every day | ORAL | 6 refills | Status: DC

## 2016-05-21 NOTE — Assessment & Plan Note (Addendum)
Chronic, stable. Update labs. Continue synthroid 46mg daily.

## 2016-05-21 NOTE — Assessment & Plan Note (Signed)
Continue 1000 IU daily. 

## 2016-05-21 NOTE — Progress Notes (Signed)
Pre visit review using our clinic review tool, if applicable. No additional management support is needed unless otherwise documented below in the visit note. 

## 2016-05-21 NOTE — Assessment & Plan Note (Signed)
Reviewed healthy diet. Congratulated on weight loss to date.

## 2016-05-21 NOTE — Assessment & Plan Note (Signed)
Update CXR today.

## 2016-05-21 NOTE — Patient Instructions (Addendum)
Sign release for records from [facility] for colonoscopy. Labs and xray today.  Synthroid refilled. Return in 6-12 months for physical.

## 2016-05-21 NOTE — Progress Notes (Signed)
BP 126/78   Pulse 64   Temp 98.5 F (36.9 C) (Oral)   Wt 194 lb (88 kg)   BMI 36.06 kg/m    CC: f/u visit Subjective:    Patient ID: Monica [name], female    DOB: 08/10/1965, 50 y.o.   MRN: [id number]  HPI: Monica [name] is a 50 y.o. female presenting on 05/21/2016 for Follow-up   Last seen here 03/2015.  Colon cancer screening - colonoscopy thinks with [facility] 09/26/2015.   Hypothyroidism - denies thyroid symptoms. On synthroid 98mg daily. Takes 1/2 of 1577m tablet daily.  Obesity - healthier diet. Significant fatigue limiting exercise regimen.   Sarcoid - no fevers. No dyspnea. No cough. No night sweats.   Relevant past medical, surgical, family and social history reviewed and updated as indicated. Interim medical history since our last visit reviewed. Allergies and medications reviewed and updated. Current Outpatient Prescriptions on File Prior to Visit  Medication Sig  . Cholecalciferol (VITAMIN D3) 1000 UNITS CAPS Take 1 capsule (1,000 Units total) by mouth daily.  . Marland Kitchenbuprofen (ADVIL,MOTRIN) 200 MG tablet Take 600-800 mg by mouth every 6 (six) hours as needed for moderate pain.   No current facility-administered medications on file prior to visit.     Review of Systems Per HPI unless specifically indicated in ROS section     Objective:    BP 126/78   Pulse 64   Temp 98.5 F (36.9 C) (Oral)   Wt 194 lb (88 kg)   BMI 36.06 kg/m   Wt Readings from Last 3 Encounters:  05/21/16 194 lb (88 kg)  08/01/15 193 lb 9.6 oz (87.8 kg)  04/01/15 197 lb 12 oz (89.7 kg)    Physical Exam  Constitutional: She appears well-developed and well-nourished. No distress.  HENT:  Mouth/Throat: Oropharynx is clear and moist. No oropharyngeal exudate.  Eyes: Conjunctivae are normal. Pupils are equal, round, and reactive to light.  Neck: Normal range of motion. Neck supple. No thyromegaly present.  Cardiovascular: Normal rate, regular rhythm, normal heart sounds and intact  distal pulses.   No murmur heard. Pulmonary/Chest: Effort normal and breath sounds normal. No respiratory distress. She has no wheezes. She has no rales.  Musculoskeletal: She exhibits no edema.  Skin: Skin is warm and dry. No rash noted.  Psychiatric: She has a normal mood and affect.  Nursing note and vitals reviewed.  Results for orders placed or performed in visit on 04/21/15  Fecal Occult Blood, Guaiac  Result Value Ref Range   Fecal Occult Blood Negative    Lab Results  Component Value Date   TSH 1.48 04/04/2015       Assessment & Plan:   Problem List Items Addressed This Visit    History of sarcoidosis    Update CXR today.      Relevant Orders   DG Chest 2 View   Hypothyroidism - Primary    Chronic, stable. Update labs. Continue synthroid 7568mdaily.       Relevant Medications   levothyroxine (SYNTHROID, LEVOTHROID) 150 MCG tablet   Other Relevant Orders   TSH   Obesity, Class II, BMI 35-39.9, no comorbidity    Reviewed healthy diet. Congratulated on weight loss to date.       Relevant Orders   Lipid panel   Comprehensive metabolic panel   Vitamin D deficiency    Continue 1000 IU daily.       Other Visit Diagnoses    Chronic fatigue  Relevant Orders   Vitamin B12   VITAMIN D 25 Hydroxy (Vit-D Deficiency, Fractures)   CBC with Differential/Platelet       Follow up plan: Return in about 6 months (around 11/19/2016) for annual exam, prior fasting for blood work.  [name], MD

## 2016-05-22 ENCOUNTER — Other Ambulatory Visit: Payer: Self-pay | Admitting: Family Medicine

## 2016-05-22 DIAGNOSIS — R9389 Abnormal findings on diagnostic imaging of other specified body structures: Secondary | ICD-10-CM

## 2016-05-22 MED ORDER — VITAMIN D3 25 MCG (1000 UT) PO CAPS: 2.0000 | ORAL_CAPSULE | Freq: Every day | ORAL | Status: DC

## 2016-05-24 ENCOUNTER — Encounter: Payer: Self-pay | Admitting: *Deleted

## 2016-05-25 ENCOUNTER — Ambulatory Visit (INDEPENDENT_AMBULATORY_CARE_PROVIDER_SITE_OTHER)
Admission: RE | Admit: 2016-05-25 | Discharge: 2016-05-25 | Disposition: A | Discharge status: Home / Self Care | Payer: BLUE CROSS/BLUE SHIELD | Source: Ambulatory Visit | Attending: Family Medicine | Admitting: Family Medicine

## 2016-05-25 DIAGNOSIS — R938 Abnormal findings on diagnostic imaging of other specified body structures: Secondary | ICD-10-CM | POA: Diagnosis not present

## 2016-05-25 DIAGNOSIS — R911 Solitary pulmonary nodule: Secondary | ICD-10-CM | POA: Diagnosis not present

## 2016-05-25 DIAGNOSIS — R9389 Abnormal findings on diagnostic imaging of other specified body structures: Secondary | ICD-10-CM

## 2016-05-28 ENCOUNTER — Telehealth: Payer: Self-pay | Admitting: Family Medicine

## 2016-05-28 ENCOUNTER — Other Ambulatory Visit: Payer: Self-pay | Admitting: Family Medicine

## 2016-05-28 DIAGNOSIS — R911 Solitary pulmonary nodule: Secondary | ICD-10-CM

## 2016-05-28 DIAGNOSIS — C3411 Malignant neoplasm of upper lobe, right bronchus or lung: Secondary | ICD-10-CM

## 2016-05-28 DIAGNOSIS — M1811 Unilateral primary osteoarthritis of first carpometacarpal joint, right hand: Secondary | ICD-10-CM | POA: Diagnosis not present

## 2016-05-28 DIAGNOSIS — Z85118 Personal history of other malignant neoplasm of bronchus and lung: Secondary | ICD-10-CM | POA: Insufficient documentation

## 2016-05-28 DIAGNOSIS — G5603 Carpal tunnel syndrome, bilateral upper limbs: Secondary | ICD-10-CM | POA: Insufficient documentation

## 2016-05-28 DIAGNOSIS — M65311 Trigger thumb, right thumb: Secondary | ICD-10-CM | POA: Diagnosis not present

## 2016-05-28 HISTORY — DX: Malignant neoplasm of upper lobe, right bronchus or lung: C34.11

## 2016-05-28 NOTE — Telephone Encounter (Signed)
Spoke with patient. See result note. Will order CT

## 2016-05-28 NOTE — Telephone Encounter (Signed)
Pt called regarding recent xray. Please call [phone] Thanks

## 2016-06-03 ENCOUNTER — Telehealth: Payer: Self-pay | Admitting: Family Medicine

## 2016-06-03 ENCOUNTER — Encounter: Payer: Self-pay | Admitting: Family Medicine

## 2016-06-03 ENCOUNTER — Ambulatory Visit
Admission: RE | Admit: 2016-06-03 | Discharge: 2016-06-03 | Disposition: A | Discharge status: Home / Self Care | Payer: BLUE CROSS/BLUE SHIELD | Source: Ambulatory Visit | Attending: Family Medicine | Admitting: Family Medicine

## 2016-06-03 DIAGNOSIS — R911 Solitary pulmonary nodule: Secondary | ICD-10-CM

## 2016-06-03 MED ORDER — IOPAMIDOL (ISOVUE-300) INJECTION 61%
75.0000 mL | Freq: Once | INTRAVENOUS | Status: AC | PRN
  Administered 2016-06-03: 75 mL via INTRAVENOUS

## 2016-06-03 NOTE — Telephone Encounter (Addendum)
Received phone call from radiologist regarding 1.1cm spiculated pleural based nodule RUL apex which was concerning. rec PET and/or biopsy. Discussed with patient - I will order PET scan to start.  If abnormal, will refer to [facility]

## 2016-06-03 NOTE — Telephone Encounter (Signed)
Patient scheduled for Monday November 6th at [place] at [facility], patient notified.

## 2016-06-04 DIAGNOSIS — M79644 Pain in right finger(s): Secondary | ICD-10-CM | POA: Diagnosis not present

## 2016-06-04 DIAGNOSIS — M1811 Unilateral primary osteoarthritis of first carpometacarpal joint, right hand: Secondary | ICD-10-CM | POA: Diagnosis not present

## 2016-06-07 ENCOUNTER — Other Ambulatory Visit: Payer: Self-pay | Admitting: Family Medicine

## 2016-06-07 ENCOUNTER — Ambulatory Visit
Admission: RE | Admit: 2016-06-07 | Discharge: 2016-06-07 | Disposition: A | Discharge status: Home / Self Care | Payer: BLUE CROSS/BLUE SHIELD | Source: Ambulatory Visit | Attending: Family Medicine | Admitting: Family Medicine

## 2016-06-07 ENCOUNTER — Telehealth: Payer: Self-pay | Admitting: *Deleted

## 2016-06-07 DIAGNOSIS — R911 Solitary pulmonary nodule: Secondary | ICD-10-CM | POA: Diagnosis not present

## 2016-06-07 LAB — GLUCOSE, CAPILLARY: Glucose-Capillary: 113 mg/dL — ABNORMAL HIGH (ref 65–99)

## 2016-06-07 MED ORDER — FLUDEOXYGLUCOSE F - 18 (FDG) INJECTION
12.0000 | Freq: Once | INTRAVENOUS | Status: AC | PRN
  Administered 2016-06-07: 12.62 via INTRAVENOUS

## 2016-06-07 NOTE — Telephone Encounter (Signed)
Oncology Nurse Navigator Documentation  Oncology Nurse Navigator Flowsheets 06/07/2016  Referral date to RadOnc/MedOnc 06/07/2016  Navigator Encounter Type Telephone/I received a referral on Monica [name] today.  I called [facility] for an appt.  I called patient and gave her the appt of 06/15/16 arrive at 4:00.  I also gave her the address of appt.  She verbalized understanding of appt time and place.   Telephone Outgoing Call  Treatment Phase Pre-Tx/Tx Discussion  Barriers/Navigation Needs Coordination of Care  Interventions Coordination of Care  Coordination of Care Appts  Acuity Level 2  Acuity Level 2 Assistance expediting appointments  Time Spent with Patient 30

## 2016-06-08 ENCOUNTER — Telehealth: Payer: Self-pay | Admitting: Family Medicine

## 2016-06-08 ENCOUNTER — Encounter: Payer: Self-pay | Admitting: Thoracic Surgery (Cardiothoracic Vascular Surgery)

## 2016-06-08 ENCOUNTER — Other Ambulatory Visit: Payer: Self-pay | Admitting: *Deleted

## 2016-06-08 ENCOUNTER — Institutional Professional Consult (permissible substitution) (INDEPENDENT_AMBULATORY_CARE_PROVIDER_SITE_OTHER): Payer: BLUE CROSS/BLUE SHIELD | Admitting: Thoracic Surgery (Cardiothoracic Vascular Surgery)

## 2016-06-08 VITALS — BP 168/90 | HR 77 | Resp 16 | Ht 61.5 in | Wt 194.0 lb

## 2016-06-08 DIAGNOSIS — R918 Other nonspecific abnormal finding of lung field: Secondary | ICD-10-CM | POA: Diagnosis not present

## 2016-06-08 DIAGNOSIS — R911 Solitary pulmonary nodule: Secondary | ICD-10-CM

## 2016-06-08 NOTE — Telephone Encounter (Signed)
Noted! Thank you

## 2016-06-08 NOTE — Telephone Encounter (Signed)
Monica [name] called to let you know that she is having surgery on 06/14/16 to remove the small nodule . He called it VAT . She just wanted to let you know.

## 2016-06-08 NOTE — Progress Notes (Signed)
PCP is [name], MD Referring Provider is [name], MD  Chief Complaint  Patient presents with  . Lung Lesion    CT CHEST 06/03/16, PET 06/07/16    HPI: Monica [name] is a 50 year old woman who is a lifelong nonsmoker. She has a past medical history significant for sarcoidosis, hypertension, and hypothyroidism. She was diagnosed with sarcoidosis back in 2003. She was initially treated with prednisone. She was followed with CTs every 6 months for a while and then annual chest x-rays after that. She recently saw Dr. Danise Mina for follow-up visit. She had not had a chest x-ray in a while so one was ordered. It showed a right upper lobe lung nodule. A CT scan confirmed an 11 x 11 mm spiculated right upper lobe nodule. There was no mediastinal or hilar adenopathy. A PET CT showed a nodule was hypermetabolic with an SUV of 3.5. There was no suspicious adenopathy or other areas of hypermetabolism.  She feels well. She denies cough, hemoptysis, wheezing, shortness of breath, chest pain, pressure, or tightness. She denies any change in appetite or weight loss. She has not had any headaches or visual changes.  Zubrod Score: At the time of surgery this patient's most appropriate activity status/level should be described as: _0     0    Normal activity, no symptoms _1     1    Restricted in physical strenuous activity but ambulatory, able to do out light work _2     2    Ambulatory and capable of self care, unable to do work activities, up and about >50 % of waking hours                              _3     3    Only limited self care, in bed greater than 50% of waking hours _4     4    Completely disabled, no self care, confined to bed or chair _5     5    Moribund    Past Medical History:  Diagnosis Date  . History of chicken pox   . History of hypertension   . Hypothyroidism   . Sarcoidosis ([place]) ~2004   treated with prendisone and stable since then    Past Surgical History:   Procedure Laterality Date  . EYE SURGERY Bilateral 1998   Lasik  . TUBAL LIGATION  2001    Family History  Problem Relation Age of Onset  . Cancer Mother 72    breast with met to bone  . Hypertension Mother   . Cancer Father 63    prostate  . CAD Maternal Grandmother     MI  . Stroke Maternal Grandmother   . Stroke Paternal Grandmother   . CAD Paternal Grandmother   . Diabetes Paternal Grandfather     Social History Social History  Substance Use Topics  . Smoking status: Never Smoker  . Smokeless tobacco: Never Used  . Alcohol use No    Current Outpatient Prescriptions  Medication Sig Dispense Refill  . Cholecalciferol (VITAMIN D3) 1000 units CAPS Take 2 capsules (2,000 Units total) by mouth daily. 30 capsule   . ibuprofen (ADVIL,MOTRIN) 200 MG tablet Take 600-800 mg by mouth every 6 (six) hours as needed for moderate pain.    Marland Kitchen levothyroxine (SYNTHROID, LEVOTHROID) 150 MCG tablet Take 1 tablet (150 mcg total) by mouth daily before breakfast. 30 tablet 6   No current facility-administered medications  for this visit.     No Known Allergies  Review of Systems  Constitutional: Negative for activity change, appetite change, chills, fever and unexpected weight change.  HENT: Negative for trouble swallowing and voice change.   Eyes: Negative for visual disturbance.  Respiratory: Negative for cough and wheezing.   Cardiovascular: Negative for chest pain, palpitations and leg swelling.  Gastrointestinal: Negative for abdominal pain and blood in stool.  Endocrine: Negative for polydipsia and polyuria.  Genitourinary: Negative for difficulty urinating and dysuria.  Musculoskeletal: Positive for arthralgias and joint swelling (right thumb).  Neurological: Negative for dizziness, syncope and weakness.  Hematological: Negative for adenopathy. Does not bruise/bleed easily.  All other systems reviewed and are negative.   Ht 5' 1.5" (1.562 m)   Wt 194 lb (88 kg)   BMI 36.06  kg/m  Physical Exam  Constitutional: She is oriented to person, place, and time. She appears well-developed and well-nourished. No distress.  HENT:  Head: Normocephalic and atraumatic.  Mouth/Throat: No oropharyngeal exudate.  Eyes: Conjunctivae and EOM are normal. No scleral icterus.  Neck: Neck supple. No thyromegaly present.  Cardiovascular: Normal rate, regular rhythm, normal heart sounds and intact distal pulses.  Exam reveals no gallop and no friction rub.   No murmur heard. Pulmonary/Chest: Effort normal and breath sounds normal. No respiratory distress. She has no wheezes. She has no rales.  Abdominal: Soft. She exhibits no distension. There is no tenderness.  Musculoskeletal: She exhibits no edema.  Lymphadenopathy:    She has no cervical adenopathy.  Neurological: She is alert and oriented to person, place, and time. No cranial nerve deficit.  Skin: Skin is warm and dry.  Psychiatric: She has a normal mood and affect.  Vitals reviewed.    Diagnostic Tests: CT CHEST WITH CONTRAST  TECHNIQUE: Multidetector CT imaging of the chest was performed during intravenous contrast administration.  CONTRAST:  44m ISOVUE-300 IOPAMIDOL (ISOVUE-300) INJECTION 61%  COMPARISON:  Chest x-ray 05/25/2016  FINDINGS: Cardiovascular: Cardiac size is unremarkable. No pericardial effusion. Central pulmonary artery and thoracic aorta is unremarkable.  Mediastinum/Nodes: No mediastinal hematoma or adenopathy. No hilar adenopathy.  Lungs/Pleura: Images of the lung parenchyma shows no infiltrate or pulmonary edema. As noted on chest x-ray there is a spiculated pleural based nodule in right upper lobe laterally measures 11 x 11 mm. This is best seen in coronal image 63. This is highly suspicious for malignancy. Further correlation with PET scan and/or biopsy is recommended. There is a nodule in right upper lobe posteriorly measures 6 mm. A ground glass nodule in left upper lobe  laterally measures 4 mm. Ground-glass nodule in superior segment of right lower lobe measures 4.6 mm. A nodule in right middle lobe measures 4 mm. There is a left apical nodule measures 6 mm. 4 mm nodule is noted in right upper lobe anteriorly. Axial image 55.  Upper Abdomen: The visualized upper abdomen shows no evidence of adrenal gland mass. Mild fatty infiltration of the liver. Visualized pancreas and spleen is unremarkable.  Musculoskeletal:  Sagittal images of the spine shows mild degenerative changes thoracic spine. Small hiatal hernia  IMPRESSION: 1. As noted on chest x-ray there is a pleural based spiculated nodule in right upper lobe laterally measures 1.1 cm. Further correlation with PET scan and/or biopsy is recommended to exclude malignancy. 2. Additional subcentimeter nodules are noted bilaterally. The largest nodule in left apex measures 6 mm. Metastatic disease cannot be excluded. Follow-up CT scan in 3 months is recommended to assure stability.  3. No infiltrate or pulmonary edema. 4. Degenerative changes thoracic spine.  No hilar adenopathy. 5. Small hiatal hernia. These results were called by telephone at the time of interpretation on 06/03/2016 at 2:41 pm to Dr. [name] , who verbally acknowledged these results.   Electronically Signed   By: Lahoma Crocker M.D.   On: 06/03/2016 14:42  NUCLEAR MEDICINE PET SKULL BASE TO THIGH  TECHNIQUE: 12.6 mCi F-18 FDG was injected intravenously. Full-ring PET imaging was performed from the skull base to thigh after the radiotracer. CT data was obtained and used for attenuation correction and anatomic localization.  FASTING BLOOD GLUCOSE:  Value: 113 mg/dl  COMPARISON:  CT chest 06/03/2016.  FINDINGS: NECK  No hypermetabolic lymph nodes in the neck. CT images show no acute findings.  CHEST  No hypermetabolic mediastinal, hilar or axillary lymph nodes. A 9 mm spiculated nodule in the apical  segment right upper lobe (CT image 69) has an SUV max of 3.5. No additional hypermetabolic pulmonary nodules. No pericardial or pleural effusion.  ABDOMEN/PELVIS  No abnormal hypermetabolism in the liver, adrenal glands, spleen or pancreas. No hypermetabolic lymph nodes. Sub cm low-attenuation lesion in the dome of the liver is too small to characterize. Liver, gallbladder, adrenal glands and right kidney are unremarkable. 1.7 cm low-attenuation lesion in the interpolar left kidney is difficult to further characterize without post-contrast imaging. Spleen, pancreas, stomach and bowel are grossly unremarkable. No free fluid.  SKELETON  No abnormal osseous hypermetabolism.  IMPRESSION: Malignant range hypermetabolism within a spiculated right upper lobe nodule, highly worrisome for stage IA primary bronchogenic carcinoma.   Electronically Signed   By: Lorin Picket M.D.   On: 06/07/2016 09:32  I personally reviewed the CT and PET CT images and concur with the findings noted above.  Impression: 50 year old woman with a newly discovered spiculated right upper lobe nodule. This is approximately 1 cm in size and is hypermetabolic on PET/CT. It is suspicious for a new stage I primary bronchogenic carcinoma. A sarcoid nodule is also a possibility, although it normally doesn't present as a solitary nodule.  I reviewed the films with Monica [name] and her husband. We discussed the differential diagnosis. We discussed options for diagnosis and treatment. We discussed the relative advantages and disadvantages of CT-guided biopsy, bronchoscopic biopsy, or VATS for wedge resection and then possible lobectomy depending on intraoperative frozen section findings. She strongly favors proceeding with VATS to get a definitive diagnosis.  I recommended to Monica [name] that we proceed with right VATS, wedge resection, and possible right upper lobectomy. She understands the general nature  of the procedure, the incisions to be used, the need for general anesthesia, the use of a drainage tube postoperatively, the expected hospital stay, and the overall recovery. She also understands interrupted decision making regarding an anatomic resection. I informed her the indications, risks, benefits, and alternatives. She understands the risks include, but are not limited to death, MI, DVT, PE, bleeding, possible need for transfusion, infection, prolonged air leak, cardiac arrhythmias, as well as the possibility of other unforeseeable complications.  She accepts the risks and agrees to proceed.  Plan: Pulmonary function testing Right VATS, wedge resection, possible right upper lobectomy on Monday, 06/14/2016.  Melrose Nakayama, MD [facility] [phone]

## 2016-06-10 NOTE — Pre-Procedure Instructions (Signed)
Emonni Depasquale Hoar  06/10/2016      [facility] - [place], [place] - [address] [place] [address] Phone: [phone] Fax: [phone]  [facility] - [place], [place] - [address] [place] AT [address] [place] [address] Phone: [phone] Fax: [phone]    Your procedure is scheduled on November 13  Report to [facility] at [facility].  Call this number if you have problems the morning of surgery:  [phone]   Remember:  Do not eat food or drink liquids after midnight.   Take these medicines the morning of surgery with A SIP OF WATER levothyroxine (SYNTHROID, LEVOTHROID)  7 days prior to surgery STOP taking any Aspirin, Aleve, Naproxen, Ibuprofen, Motrin, Advil, Goody's, BC's, all herbal medications, fish oil, and all vitamins    Do not wear jewelry, make-up or nail polish.  Do not wear lotions, powders, or perfumes, or deoderant.  Do not shave 48 hours prior to surgery.    Do not bring valuables to the hospital.  [facility] is not responsible for any belongings or valuables.  Contacts, dentures or bridgework may not be worn into surgery.  Leave your suitcase in the car.  After surgery it may be brought to your room.  For patients admitted to the hospital, discharge time will be determined by your treatment team.  Patients discharged the day of surgery will not be allowed to drive home.    Special instructions:   [facility]- Preparing For Surgery  Before surgery, you can play an important role. Because skin is not sterile, your skin needs to be as free of germs as possible. You can reduce the number of germs on your skin by washing with CHG (chlorahexidine gluconate) Soap before surgery.  CHG is an antiseptic cleaner which kills germs and bonds with the skin to continue killing germs even after washing.  Please do not use if you  have an allergy to CHG or antibacterial soaps. If your skin becomes reddened/irritated stop using the CHG.  Do not shave (including legs and underarms) for at least 48 hours prior to first CHG shower. It is OK to shave your face.  Please follow these instructions carefully.   1. Shower the NIGHT BEFORE SURGERY and the MORNING OF SURGERY with CHG.   2. If you chose to wash your hair, wash your hair first as usual with your normal shampoo.  3. After you shampoo, rinse your hair and body thoroughly to remove the shampoo.  4. Use CHG as you would any other liquid soap. You can apply CHG directly to the skin and wash gently with a scrungie or a clean washcloth.   5. Apply the CHG Soap to your body ONLY FROM THE NECK DOWN.  Do not use on open wounds or open sores. Avoid contact with your eyes, ears, mouth and genitals (private parts). Wash genitals (private parts) with your normal soap.  6. Wash thoroughly, paying special attention to the area where your surgery will be performed.  7. Thoroughly rinse your body with warm water from the neck down.  8. DO NOT shower/wash with your normal soap after using and rinsing off the CHG Soap.  9. Pat yourself dry with a CLEAN TOWEL.   10. Wear CLEAN PAJAMAS   11. Place CLEAN SHEETS on your bed the night of your first shower  and DO NOT SLEEP WITH PETS.    Day of Surgery: Do not apply any deodorants/lotions. Please wear clean clothes to the hospital/surgery center.      Please read over the following fact sheets that you were given.

## 2016-06-11 ENCOUNTER — Encounter (HOSPITAL_COMMUNITY): Payer: Self-pay

## 2016-06-11 ENCOUNTER — Encounter (HOSPITAL_COMMUNITY)
Admission: RE | Admit: 2016-06-11 | Discharge: 2016-06-11 | Disposition: A | Discharge status: Home / Self Care | Payer: BLUE CROSS/BLUE SHIELD | Source: Ambulatory Visit | Attending: Thoracic Surgery (Cardiothoracic Vascular Surgery) | Admitting: Thoracic Surgery (Cardiothoracic Vascular Surgery)

## 2016-06-11 ENCOUNTER — Ambulatory Visit (HOSPITAL_COMMUNITY)
Admission: RE | Admit: 2016-06-11 | Discharge: 2016-06-11 | Disposition: A | Discharge status: Home / Self Care | Payer: BLUE CROSS/BLUE SHIELD | Source: Ambulatory Visit | Attending: Thoracic Surgery (Cardiothoracic Vascular Surgery) | Admitting: Thoracic Surgery (Cardiothoracic Vascular Surgery)

## 2016-06-11 DIAGNOSIS — Z01812 Encounter for preprocedural laboratory examination: Secondary | ICD-10-CM | POA: Insufficient documentation

## 2016-06-11 DIAGNOSIS — Z01818 Encounter for other preprocedural examination: Secondary | ICD-10-CM | POA: Diagnosis not present

## 2016-06-11 DIAGNOSIS — R911 Solitary pulmonary nodule: Secondary | ICD-10-CM | POA: Diagnosis not present

## 2016-06-11 DIAGNOSIS — Z01810 Encounter for preprocedural cardiovascular examination: Secondary | ICD-10-CM | POA: Insufficient documentation

## 2016-06-11 DIAGNOSIS — R9431 Abnormal electrocardiogram [ECG] [EKG]: Secondary | ICD-10-CM | POA: Insufficient documentation

## 2016-06-11 LAB — PULMONARY FUNCTION TEST
DL/VA % PRED: 151 %
DL/VA: 6.65 ml/min/mmHg/L
DLCO UNC % PRED: 103 %
DLCO UNC: 21 ml/min/mmHg
DLCO cor % pred: 102 %
DLCO cor: 20.69 ml/min/mmHg
FEF 25-75 PRE: 3.24 L/s
FEF 25-75 Post: 3.05 L/sec
FEF2575-%Change-Post: -5 %
FEF2575-%Pred-Post: 120 %
FEF2575-%Pred-Pre: 128 %
FEV1-%Change-Post: 0 %
FEV1-%PRED-POST: 86 %
FEV1-%PRED-PRE: 87 %
FEV1-PRE: 2.18 L
FEV1-Post: 2.16 L
FEV1FVC-%Change-Post: 4 %
FEV1FVC-%Pred-Pre: 107 %
FEV6-%CHANGE-POST: -4 %
FEV6-%PRED-POST: 78 %
FEV6-%PRED-PRE: 82 %
FEV6-POST: 2.39 L
FEV6-PRE: 2.52 L
FEV6FVC-%CHANGE-POST: 0 %
FEV6FVC-%PRED-POST: 103 %
FEV6FVC-%PRED-PRE: 102 %
FVC-%CHANGE-POST: -5 %
FVC-%Pred-Post: 76 %
FVC-%Pred-Pre: 80 %
FVC-Post: 2.39 L
FVC-Pre: 2.52 L
POST FEV6/FVC RATIO: 100 %
Post FEV1/FVC ratio: 90 %
Pre FEV1/FVC ratio: 86 %
Pre FEV6/FVC Ratio: 100 %
RV % PRED: 48 %
RV: 0.81 L
TLC % pred: 70 %
TLC: 3.25 L

## 2016-06-11 LAB — URINALYSIS, ROUTINE W REFLEX MICROSCOPIC
BILIRUBIN URINE: NEGATIVE
Glucose, UA: NEGATIVE mg/dL
Ketones, ur: NEGATIVE mg/dL
Leukocytes, UA: NEGATIVE
Nitrite: NEGATIVE
PH: 6 (ref 5.0–8.0)
Protein, ur: NEGATIVE mg/dL
SPECIFIC GRAVITY, URINE: 1.028 (ref 1.005–1.030)

## 2016-06-11 LAB — COMPREHENSIVE METABOLIC PANEL
ALBUMIN: 4.2 g/dL (ref 3.5–5.0)
ALT: 14 U/L (ref 14–54)
ANION GAP: 8 (ref 5–15)
AST: 18 U/L (ref 15–41)
Alkaline Phosphatase: 57 U/L (ref 38–126)
BILIRUBIN TOTAL: 2.4 mg/dL — AB (ref 0.3–1.2)
BUN: 12 mg/dL (ref 6–20)
CO2: 24 mmol/L (ref 22–32)
Calcium: 9.1 mg/dL (ref 8.9–10.3)
Chloride: 107 mmol/L (ref 101–111)
Creatinine, Ser: 0.69 mg/dL (ref 0.44–1.00)
GFR calc Af Amer: >60 mL/min (ref >60)
GFR calc non Af Amer: >60 mL/min (ref >60)
GLUCOSE: 89 mg/dL (ref 65–99)
POTASSIUM: 4 mmol/L (ref 3.5–5.1)
SODIUM: 139 mmol/L (ref 135–145)
TOTAL PROTEIN: 6.8 g/dL (ref 6.5–8.1)

## 2016-06-11 LAB — BLOOD GAS, ARTERIAL
ACID-BASE EXCESS: 0.7 mmol/L (ref 0.0–2.0)
BICARBONATE: 22.9 mmol/L (ref 20.0–28.0)
DRAWN BY: 242311
O2 SAT: 99.3 %
PATIENT TEMPERATURE: 98.6
pCO2 arterial: 25.7 mmHg — ABNORMAL LOW (ref 32.0–48.0)
pH, Arterial: 7.558 — ABNORMAL HIGH (ref 7.350–7.450)
pO2, Arterial: 122 mmHg — ABNORMAL HIGH (ref 83.0–108.0)

## 2016-06-11 LAB — CBC
HEMATOCRIT: 40.7 % (ref 36.0–46.0)
Hemoglobin: 13.9 g/dL (ref 12.0–15.0)
MCH: 28 pg (ref 26.0–34.0)
MCHC: 34.2 g/dL (ref 30.0–36.0)
MCV: 82.1 fL (ref 78.0–100.0)
PLATELETS: 280 10*3/uL (ref 150–400)
RBC: 4.96 MIL/uL (ref 3.87–5.11)
RDW: 13.7 % (ref 11.5–15.5)
WBC: 8.3 10*3/uL (ref 4.0–10.5)

## 2016-06-11 LAB — TYPE AND SCREEN
ABO/RH(D): O POS
ANTIBODY SCREEN: NEGATIVE

## 2016-06-11 LAB — ABO/RH: ABO/RH(D): O POS

## 2016-06-11 LAB — URINE MICROSCOPIC-ADD ON

## 2016-06-11 LAB — APTT: APTT: 30 s (ref 24–36)

## 2016-06-11 LAB — PROTIME-INR
INR: 0.99
Prothrombin Time: 13.1 seconds (ref 11.4–15.2)

## 2016-06-11 LAB — SURGICAL PCR SCREEN
MRSA, PCR: NEGATIVE
Staphylococcus aureus: NEGATIVE

## 2016-06-11 MED ORDER — ALBUTEROL SULFATE (2.5 MG/3ML) 0.083% IN NEBU
2.5000 mg | INHALATION_SOLUTION | Freq: Once | RESPIRATORY_TRACT | Status: AC
  Administered 2016-06-11: 2.5 mg via RESPIRATORY_TRACT

## 2016-06-11 NOTE — Progress Notes (Signed)
PCP - Montez Morita Cardiologist - denies  Chest x-ray - 06/11/16 EKG - 06/11/16 Stress Test - denies ECHO - denies Cardiac Cath - denies    Patient denies shortness of breath, fever, cough and chest pain at PAT appointment

## 2016-06-11 NOTE — Progress Notes (Signed)
Anesthesia Chart Review: Patient is a 50 year old female scheduled for right VATS, possible RU lobectomy on 06/14/16 by Dr. Roxan Hockey. She has a hypermetabolic RUL spiculated lung lesion.   History includes nonsmoker, hypothyroidism, hypertension, sarcoidosis 2004, tubal ligation '01. BMI is consistent with obesity.  PCP is Dr. [name].  Meds include vitamin D, levothyroxine.  06/11/16 EKG: NSR, non-specific ST/T wave abnormality. There are no comparison EKGs in Epic or Muse. She denied CP, SOB, cough, fever at PAT.   06/11/16 CXR: IMPRESSION: Stable right upper lobe nodule. No other active cardiopulmonary disease.  06/03/16 Chest CT: IMPRESSION: 1. As noted on chest x-ray there is a pleural based spiculated nodule in right upper lobe laterally measures 1.1 cm. Further correlation with PET scan and/or biopsy is recommended to exclude malignancy. 2. Additional subcentimeter nodules are noted bilaterally. The largest nodule in left apex measures 6 mm. Metastatic disease cannot be excluded. Follow-up CT scan in 3 months is recommended to assure stability. 3. No infiltrate or pulmonary edema. 4. Degenerative changes thoracic spine.  No hilar adenopathy. 5. Small hiatal hernia.  Preoperative labs noted. UA was cloudy but negative for leukocytes and nitrites.  If she remains asymptomatic from a CV standpoint and otherwise no acute changes then I anticipate she can proceed as planned.  [facility]/Anesthesiology Phone [phone] 06/11/2016 4:09 PM

## 2016-06-11 NOTE — Progress Notes (Signed)
Left message for Thurmond Butts about ABG and urine

## 2016-06-13 ENCOUNTER — Encounter: Payer: Self-pay | Admitting: Family Medicine

## 2016-06-13 NOTE — Anesthesia Preprocedure Evaluation (Addendum)
Anesthesia Evaluation  Patient identified by MRN, date of birth, ID band Patient awake    Reviewed: Allergy & Precautions, NPO status , Patient's Chart, lab work & pertinent test results  History of Anesthesia Complications Negative for: history of anesthetic complications  Airway Mallampati: II  TM Distance: >3 FB Neck ROM: Full    Dental no notable dental hx. (+) Dental Advisory Given   Pulmonary  Sarcoid   Pulmonary exam normal        Cardiovascular negative cardio ROS Normal cardiovascular exam     Neuro/Psych negative neurological ROS  negative psych ROS   GI/Hepatic negative GI ROS, Neg liver ROS,   Endo/Other  Hypothyroidism Morbid obesity  Renal/GU negative Renal ROS     Musculoskeletal   Abdominal   Peds  Hematology   Anesthesia Other Findings   Reproductive/Obstetrics                            Anesthesia Physical Anesthesia Plan  ASA: III  Anesthesia Plan: General   Post-op Pain Management:    Induction: Intravenous  Airway Management Planned: Double Lumen EBT  Additional Equipment: CVP and Arterial line  Intra-op Plan:   Post-operative Plan: Extubation in OR  Informed Consent: I have reviewed the patients History and Physical, chart, labs and discussed the procedure including the risks, benefits and alternatives for the proposed anesthesia with the patient or authorized representative who has indicated his/her understanding and acceptance.   Dental advisory given  Plan Discussed with: CRNA and Anesthesiologist  Anesthesia Plan Comments:        Anesthesia Quick Evaluation

## 2016-06-14 ENCOUNTER — Inpatient Hospital Stay (HOSPITAL_COMMUNITY): Payer: BLUE CROSS/BLUE SHIELD | Admitting: Certified Registered Nurse Anesthetist

## 2016-06-14 ENCOUNTER — Encounter (HOSPITAL_COMMUNITY): Payer: Self-pay | Admitting: *Deleted

## 2016-06-14 ENCOUNTER — Encounter (HOSPITAL_COMMUNITY)
Admission: RE | Disposition: A | Discharge status: Home / Self Care | Payer: Self-pay | Source: Ambulatory Visit | Attending: Thoracic Surgery (Cardiothoracic Vascular Surgery)

## 2016-06-14 ENCOUNTER — Inpatient Hospital Stay (HOSPITAL_COMMUNITY): Payer: BLUE CROSS/BLUE SHIELD

## 2016-06-14 ENCOUNTER — Inpatient Hospital Stay (HOSPITAL_COMMUNITY)
Admission: RE | Admit: 2016-06-14 | Discharge: 2016-06-18 | DRG: 164 | Disposition: A | Discharge status: Home / Self Care | Payer: BLUE CROSS/BLUE SHIELD | Source: Ambulatory Visit | Attending: Thoracic Surgery (Cardiothoracic Vascular Surgery) | Admitting: Thoracic Surgery (Cardiothoracic Vascular Surgery)

## 2016-06-14 DIAGNOSIS — Z902 Acquired absence of lung [part of]: Secondary | ICD-10-CM

## 2016-06-14 DIAGNOSIS — Z6836 Body mass index (BMI) 36.0-36.9, adult: Secondary | ICD-10-CM | POA: Diagnosis not present

## 2016-06-14 DIAGNOSIS — D869 Sarcoidosis, unspecified: Secondary | ICD-10-CM | POA: Diagnosis present

## 2016-06-14 DIAGNOSIS — J939 Pneumothorax, unspecified: Secondary | ICD-10-CM | POA: Diagnosis not present

## 2016-06-14 DIAGNOSIS — E559 Vitamin D deficiency, unspecified: Secondary | ICD-10-CM | POA: Diagnosis not present

## 2016-06-14 DIAGNOSIS — J95811 Postprocedural pneumothorax: Secondary | ICD-10-CM | POA: Diagnosis not present

## 2016-06-14 DIAGNOSIS — R911 Solitary pulmonary nodule: Secondary | ICD-10-CM | POA: Diagnosis present

## 2016-06-14 DIAGNOSIS — D62 Acute posthemorrhagic anemia: Secondary | ICD-10-CM | POA: Diagnosis not present

## 2016-06-14 DIAGNOSIS — I10 Essential (primary) hypertension: Secondary | ICD-10-CM | POA: Diagnosis not present

## 2016-06-14 DIAGNOSIS — E039 Hypothyroidism, unspecified: Secondary | ICD-10-CM | POA: Diagnosis present

## 2016-06-14 DIAGNOSIS — J9382 Other air leak: Secondary | ICD-10-CM | POA: Diagnosis not present

## 2016-06-14 DIAGNOSIS — Z4682 Encounter for fitting and adjustment of non-vascular catheter: Secondary | ICD-10-CM | POA: Diagnosis not present

## 2016-06-14 DIAGNOSIS — C3411 Malignant neoplasm of upper lobe, right bronchus or lung: Secondary | ICD-10-CM | POA: Diagnosis not present

## 2016-06-14 DIAGNOSIS — J984 Other disorders of lung: Secondary | ICD-10-CM | POA: Diagnosis not present

## 2016-06-14 DIAGNOSIS — R05 Cough: Secondary | ICD-10-CM | POA: Diagnosis not present

## 2016-06-14 DIAGNOSIS — E669 Obesity, unspecified: Secondary | ICD-10-CM | POA: Diagnosis present

## 2016-06-14 DIAGNOSIS — R918 Other nonspecific abnormal finding of lung field: Secondary | ICD-10-CM | POA: Diagnosis not present

## 2016-06-14 DIAGNOSIS — Z8709 Personal history of other diseases of the respiratory system: Secondary | ICD-10-CM | POA: Diagnosis not present

## 2016-06-14 HISTORY — PX: VIDEO ASSISTED THORACOSCOPY (VATS)/WEDGE RESECTION: SHX6174

## 2016-06-14 HISTORY — PX: LYMPH NODE DISSECTION: SHX5087

## 2016-06-14 HISTORY — PX: LOBECTOMY: SHX5089

## 2016-06-14 LAB — GLUCOSE, CAPILLARY
GLUCOSE-CAPILLARY: 133 mg/dL — AB (ref 65–99)
GLUCOSE-CAPILLARY: 158 mg/dL — AB (ref 65–99)

## 2016-06-14 SURGERY — VIDEO ASSISTED THORACOSCOPY (VATS)/WEDGE RESECTION
Anesthesia: General | Site: Chest | Laterality: Right

## 2016-06-14 MED ORDER — PROPOFOL 10 MG/ML IV BOLUS
INTRAVENOUS | Status: DC | PRN
  Administered 2016-06-14: 40 mg via INTRAVENOUS
  Administered 2016-06-14: 160 mg via INTRAVENOUS

## 2016-06-14 MED ORDER — LEVOTHYROXINE SODIUM 75 MCG PO TABS
150.0000 ug | ORAL_TABLET | Freq: Every day | ORAL | Status: DC
  Administered 2016-06-15 – 2016-06-16 (×2): 150 ug via ORAL
  Administered 2016-06-17 – 2016-06-18 (×2): 75 ug via ORAL
  Filled 2016-06-14 (×4): qty 2

## 2016-06-14 MED ORDER — LACTATED RINGERS IV SOLN
INTRAVENOUS | Status: DC | PRN
  Administered 2016-06-14: 07:00:00 via INTRAVENOUS

## 2016-06-14 MED ORDER — SCOPOLAMINE 1 MG/3DAYS TD PT72
1.0000 | MEDICATED_PATCH | Freq: Once | TRANSDERMAL | Status: AC
  Administered 2016-06-14: 1.5 mg via TRANSDERMAL
  Administered 2016-06-14: 1 via TRANSDERMAL

## 2016-06-14 MED ORDER — ONDANSETRON HCL 4 MG/2ML IJ SOLN
INTRAMUSCULAR | Status: AC
  Filled 2016-06-14: qty 2

## 2016-06-14 MED ORDER — BUPIVACAINE 0.5 % ON-Q PUMP SINGLE CATH 400 ML
400.0000 mL | INJECTION | Status: DC
  Filled 2016-06-14: qty 400

## 2016-06-14 MED ORDER — BUPIVACAINE ON-Q PAIN PUMP (FOR ORDER SET NO CHG)
INJECTION | Status: AC
  Filled 2016-06-14: qty 1

## 2016-06-14 MED ORDER — FENTANYL CITRATE (PF) 100 MCG/2ML IJ SOLN
INTRAMUSCULAR | Status: AC
  Filled 2016-06-14: qty 2

## 2016-06-14 MED ORDER — MIDAZOLAM HCL 2 MG/2ML IJ SOLN
INTRAMUSCULAR | Status: AC
  Filled 2016-06-14: qty 2

## 2016-06-14 MED ORDER — ONDANSETRON HCL 4 MG/2ML IJ SOLN
4.0000 mg | Freq: Four times a day (QID) | INTRAMUSCULAR | Status: DC | PRN
  Administered 2016-06-15: 4 mg via INTRAVENOUS

## 2016-06-14 MED ORDER — PANTOPRAZOLE SODIUM 40 MG PO TBEC
40.0000 mg | DELAYED_RELEASE_TABLET | Freq: Every day | ORAL | Status: DC
  Administered 2016-06-15 – 2016-06-18 (×4): 40 mg via ORAL
  Filled 2016-06-14 (×4): qty 1

## 2016-06-14 MED ORDER — BUPIVACAINE HCL (PF) 0.5 % IJ SOLN
INTRAMUSCULAR | Status: DC | PRN
Start: 2016-06-14 — End: 2016-06-14
  Administered 2016-06-14: 5 mL

## 2016-06-14 MED ORDER — 0.9 % SODIUM CHLORIDE (POUR BTL) OPTIME
TOPICAL | Status: DC | PRN
  Administered 2016-06-14: 3000 mL

## 2016-06-14 MED ORDER — HYDROMORPHONE HCL 2 MG/ML IJ SOLN
INTRAMUSCULAR | Status: AC
  Filled 2016-06-14: qty 1

## 2016-06-14 MED ORDER — PROPOFOL 10 MG/ML IV BOLUS
INTRAVENOUS | Status: AC
  Filled 2016-06-14: qty 20

## 2016-06-14 MED ORDER — DIPHENHYDRAMINE HCL 50 MG/ML IJ SOLN
INTRAMUSCULAR | Status: AC
  Filled 2016-06-14: qty 1

## 2016-06-14 MED ORDER — SENNOSIDES-DOCUSATE SODIUM 8.6-50 MG PO TABS
1.0000 | ORAL_TABLET | Freq: Every day | ORAL | Status: DC
  Administered 2016-06-14: 1 via ORAL
  Filled 2016-06-14 (×3): qty 1

## 2016-06-14 MED ORDER — ROCURONIUM BROMIDE 100 MG/10ML IV SOLN
INTRAVENOUS | Status: DC | PRN
Start: 2016-06-14 — End: 2016-06-14
  Administered 2016-06-14 (×3): 10 mg via INTRAVENOUS
  Administered 2016-06-14: 20 mg via INTRAVENOUS
  Administered 2016-06-14: 40 mg via INTRAVENOUS

## 2016-06-14 MED ORDER — HEMOSTATIC AGENTS (NO CHARGE) OPTIME
TOPICAL | Status: DC | PRN
  Administered 2016-06-14: 1 via TOPICAL

## 2016-06-14 MED ORDER — ACETAMINOPHEN 160 MG/5ML PO SOLN: 1000.0000 mg | Freq: Four times a day (QID) | ORAL | Status: DC

## 2016-06-14 MED ORDER — DEXTROSE 5 % IV SOLN
1.5000 g | INTRAVENOUS | Status: AC
  Administered 2016-06-14: 1.5 g via INTRAVENOUS

## 2016-06-14 MED ORDER — SODIUM CHLORIDE 0.9% FLUSH: 9.0000 mL | INTRAVENOUS | Status: DC | PRN

## 2016-06-14 MED ORDER — FENTANYL CITRATE (PF) 100 MCG/2ML IJ SOLN
INTRAMUSCULAR | Status: AC
  Filled 2016-06-14: qty 4

## 2016-06-14 MED ORDER — FENTANYL 40 MCG/ML IV SOLN
INTRAVENOUS | Status: AC
  Filled 2016-06-14: qty 25

## 2016-06-14 MED ORDER — MIDAZOLAM HCL 5 MG/5ML IJ SOLN
INTRAMUSCULAR | Status: DC | PRN
Start: 2016-06-14 — End: 2016-06-14
  Administered 2016-06-14: 2 mg via INTRAVENOUS

## 2016-06-14 MED ORDER — ACETAMINOPHEN 500 MG PO TABS
1000.0000 mg | ORAL_TABLET | Freq: Four times a day (QID) | ORAL | Status: DC
  Administered 2016-06-14 – 2016-06-18 (×14): 1000 mg via ORAL
  Filled 2016-06-14 (×15): qty 2

## 2016-06-14 MED ORDER — INSULIN ASPART 100 UNIT/ML ~~LOC~~ SOLN
0.0000 [IU] | Freq: Four times a day (QID) | SUBCUTANEOUS | Status: DC
  Administered 2016-06-14 – 2016-06-15 (×3): 2 [IU] via SUBCUTANEOUS

## 2016-06-14 MED ORDER — HYDROMORPHONE HCL 1 MG/ML IJ SOLN
0.2500 mg | INTRAMUSCULAR | Status: DC | PRN
  Administered 2016-06-14 (×4): 0.5 mg via INTRAVENOUS

## 2016-06-14 MED ORDER — SUGAMMADEX SODIUM 200 MG/2ML IV SOLN
INTRAVENOUS | Status: DC | PRN
  Administered 2016-06-14: 200 mg via INTRAVENOUS

## 2016-06-14 MED ORDER — DEXTROSE 5 % IV SOLN
1.5000 g | Freq: Two times a day (BID) | INTRAVENOUS | Status: AC
  Administered 2016-06-14 – 2016-06-15 (×2): 1.5 g via INTRAVENOUS
  Filled 2016-06-14 (×2): qty 1.5

## 2016-06-14 MED ORDER — DIPHENHYDRAMINE HCL 50 MG/ML IJ SOLN
INTRAMUSCULAR | Status: DC | PRN
  Administered 2016-06-14: 12.5 mg via INTRAVENOUS

## 2016-06-14 MED ORDER — BUPIVACAINE HCL (PF) 0.5 % IJ SOLN
INTRAMUSCULAR | Status: AC
  Filled 2016-06-14: qty 10

## 2016-06-14 MED ORDER — KCL IN DEXTROSE-NACL 20-5-0.9 MEQ/L-%-% IV SOLN
INTRAVENOUS | Status: DC
  Administered 2016-06-14: 17:00:00 via INTRAVENOUS
  Administered 2016-06-15: 1000 mL via INTRAVENOUS
  Filled 2016-06-14 (×2): qty 1000

## 2016-06-14 MED ORDER — BUPIVACAINE 0.5 % ON-Q PUMP SINGLE CATH 400 ML
INJECTION | Status: DC | PRN
  Administered 2016-06-14: 400 mL

## 2016-06-14 MED ORDER — DEXAMETHASONE SODIUM PHOSPHATE 10 MG/ML IJ SOLN
INTRAMUSCULAR | Status: AC
  Filled 2016-06-14: qty 1

## 2016-06-14 MED ORDER — PROMETHAZINE HCL 25 MG/ML IJ SOLN: 6.2500 mg | INTRAMUSCULAR | Status: DC | PRN

## 2016-06-14 MED ORDER — KETOROLAC TROMETHAMINE 30 MG/ML IJ SOLN
30.0000 mg | Freq: Four times a day (QID) | INTRAMUSCULAR | Status: AC
Start: 2016-06-14 — End: 2016-06-16
  Administered 2016-06-14 – 2016-06-16 (×8): 30 mg via INTRAVENOUS
  Filled 2016-06-14 (×8): qty 1

## 2016-06-14 MED ORDER — ROCURONIUM BROMIDE 10 MG/ML (PF) SYRINGE
PREFILLED_SYRINGE | INTRAVENOUS | Status: AC
  Filled 2016-06-14: qty 10

## 2016-06-14 MED ORDER — SCOPOLAMINE 1 MG/3DAYS TD PT72
MEDICATED_PATCH | TRANSDERMAL | Status: AC
  Filled 2016-06-14: qty 1

## 2016-06-14 MED ORDER — DEXAMETHASONE SODIUM PHOSPHATE 10 MG/ML IJ SOLN
INTRAMUSCULAR | Status: DC | PRN
  Administered 2016-06-14: 10 mg via INTRAVENOUS

## 2016-06-14 MED ORDER — FENTANYL 40 MCG/ML IV SOLN
INTRAVENOUS | Status: DC
  Administered 2016-06-14: 135 ug via INTRAVENOUS
  Administered 2016-06-14: 90 ug via INTRAVENOUS
  Administered 2016-06-14: 13:00:00 via INTRAVENOUS
  Administered 2016-06-15: 45 ug via INTRAVENOUS
  Administered 2016-06-15: 60 ug via INTRAVENOUS
  Administered 2016-06-15: 75 ug via INTRAVENOUS
  Administered 2016-06-15 (×3): 45 ug via INTRAVENOUS
  Administered 2016-06-16: 15 ug via INTRAVENOUS
  Administered 2016-06-16: 0 ug via INTRAVENOUS
  Administered 2016-06-16: 15 ug via INTRAVENOUS
  Administered 2016-06-16: 0 ug via INTRAVENOUS
  Administered 2016-06-16: 15 ug via INTRAVENOUS
  Administered 2016-06-16: 30 ug via INTRAVENOUS
  Administered 2016-06-17 (×2): 45 ug via INTRAVENOUS
  Administered 2016-06-17: 15 ug via INTRAVENOUS

## 2016-06-14 MED ORDER — FENTANYL CITRATE (PF) 100 MCG/2ML IJ SOLN
INTRAMUSCULAR | Status: DC | PRN
  Administered 2016-06-14 (×2): 100 ug via INTRAVENOUS
  Administered 2016-06-14: 50 ug via INTRAVENOUS
  Administered 2016-06-14: 100 ug via INTRAVENOUS
  Administered 2016-06-14: 50 ug via INTRAVENOUS

## 2016-06-14 MED ORDER — BISACODYL 5 MG PO TBEC
10.0000 mg | DELAYED_RELEASE_TABLET | Freq: Every day | ORAL | Status: DC
  Administered 2016-06-14 – 2016-06-18 (×5): 10 mg via ORAL
  Filled 2016-06-14 (×5): qty 2

## 2016-06-14 MED ORDER — DIPHENHYDRAMINE HCL 12.5 MG/5ML PO ELIX: 12.5000 mg | ORAL_SOLUTION | Freq: Four times a day (QID) | ORAL | Status: DC | PRN

## 2016-06-14 MED ORDER — DIPHENHYDRAMINE HCL 50 MG/ML IJ SOLN: 12.5000 mg | Freq: Four times a day (QID) | INTRAMUSCULAR | Status: DC | PRN

## 2016-06-14 MED ORDER — ONDANSETRON HCL 4 MG/2ML IJ SOLN
INTRAMUSCULAR | Status: DC | PRN
  Administered 2016-06-14: 4 mg via INTRAVENOUS

## 2016-06-14 MED ORDER — SUCCINYLCHOLINE CHLORIDE 200 MG/10ML IV SOSY
PREFILLED_SYRINGE | INTRAVENOUS | Status: DC | PRN
  Administered 2016-06-14: 100 mg via INTRAVENOUS

## 2016-06-14 MED ORDER — NALOXONE HCL 0.4 MG/ML IJ SOLN: 0.4000 mg | INTRAMUSCULAR | Status: DC | PRN

## 2016-06-14 MED ORDER — CEFUROXIME SODIUM 1.5 G IJ SOLR
INTRAMUSCULAR | Status: AC
  Filled 2016-06-14: qty 1.5

## 2016-06-14 MED ORDER — SUGAMMADEX SODIUM 200 MG/2ML IV SOLN
INTRAVENOUS | Status: AC
  Filled 2016-06-14: qty 2

## 2016-06-14 MED ORDER — DEXTROSE 5 % IV SOLN
INTRAVENOUS | Status: DC | PRN
  Administered 2016-06-14: 10 ug/min via INTRAVENOUS

## 2016-06-14 MED ORDER — ONDANSETRON HCL 4 MG/2ML IJ SOLN
4.0000 mg | Freq: Four times a day (QID) | INTRAMUSCULAR | Status: DC | PRN
  Administered 2016-06-18: 4 mg via INTRAVENOUS
  Filled 2016-06-14 (×2): qty 2

## 2016-06-14 SURGICAL SUPPLY — 105 items
ADH SKN CLS APL DERMABOND .7 (GAUZE/BANDAGES/DRESSINGS)
APL SKNCLS STERI-STRIP NONHPOA (GAUZE/BANDAGES/DRESSINGS) ×1
BAG SPEC RTRVL LRG 6X4 10 (ENDOMECHANICALS) ×1
BENZOIN TINCTURE PRP APPL 2/3 (GAUZE/BANDAGES/DRESSINGS) ×2 IMPLANT
CANISTER SUCTION 2500CC (MISCELLANEOUS) ×4 IMPLANT
CATH KIT ON Q 5IN SLV (PAIN MANAGEMENT) IMPLANT
CATH KIT ON-Q SILVERSOAK 5 (CATHETERS) IMPLANT
CATH KIT ON-Q SILVERSOAK 5IN (CATHETERS) ×2 IMPLANT
CATH THORACIC 28FR (CATHETERS) IMPLANT
CATH THORACIC 36FR (CATHETERS) IMPLANT
CATH THORACIC 36FR RT ANG (CATHETERS) IMPLANT
CLIP TI MEDIUM 6 (CLIP) ×2 IMPLANT
CONN ST 1/4X3/8  BEN (MISCELLANEOUS)
CONN ST 1/4X3/8 BEN (MISCELLANEOUS) IMPLANT
CONN Y 3/8X3/8X3/8  BEN (MISCELLANEOUS)
CONN Y 3/8X3/8X3/8 BEN (MISCELLANEOUS) IMPLANT
CONT SPEC 4OZ CLIKSEAL STRL BL (MISCELLANEOUS) ×14 IMPLANT
COVER SURGICAL LIGHT HANDLE (MISCELLANEOUS) ×2 IMPLANT
DERMABOND ADVANCED (GAUZE/BANDAGES/DRESSINGS)
DERMABOND ADVANCED .7 DNX12 (GAUZE/BANDAGES/DRESSINGS) IMPLANT
DRAIN CHANNEL 28F RND 3/8 FF (WOUND CARE) ×1 IMPLANT
DRAIN CHANNEL 32F RND 10.7 FF (WOUND CARE) IMPLANT
DRAPE LAPAROSCOPIC ABDOMINAL (DRAPES) ×2 IMPLANT
DRAPE WARM FLUID 44X44 (DRAPE) ×2 IMPLANT
ELECT BLADE 6.5 EXT (BLADE) ×2 IMPLANT
ELECT REM PT RETURN 9FT ADLT (ELECTROSURGICAL) ×2
ELECTRODE REM PT RTRN 9FT ADLT (ELECTROSURGICAL) ×1 IMPLANT
GAUZE SPONGE 4X4 12PLY STRL (GAUZE/BANDAGES/DRESSINGS) ×2 IMPLANT
GLOVE BIO SURGEON STRL SZ 6.5 (GLOVE) ×1 IMPLANT
GLOVE BIOGEL PI IND STRL 6 (GLOVE) IMPLANT
GLOVE BIOGEL PI IND STRL 6.5 (GLOVE) IMPLANT
GLOVE BIOGEL PI INDICATOR 6 (GLOVE) ×1
GLOVE BIOGEL PI INDICATOR 6.5 (GLOVE) ×1
GLOVE ECLIPSE 6.5 STRL STRAW (GLOVE) ×1 IMPLANT
GLOVE SURG SIGNA 7.5 PF LTX (GLOVE) ×4 IMPLANT
GOWN STRL REUS W/ TWL LRG LVL3 (GOWN DISPOSABLE) ×2 IMPLANT
GOWN STRL REUS W/ TWL XL LVL3 (GOWN DISPOSABLE) ×2 IMPLANT
GOWN STRL REUS W/TWL LRG LVL3 (GOWN DISPOSABLE) ×8
GOWN STRL REUS W/TWL XL LVL3 (GOWN DISPOSABLE) ×2
HANDLE STAPLE ENDO GIA SHORT (STAPLE)
HEMOSTAT SURGICEL 2X14 (HEMOSTASIS) ×1 IMPLANT
KIT BASIN OR (CUSTOM PROCEDURE TRAY) ×2 IMPLANT
KIT ROOM TURNOVER OR (KITS) ×2 IMPLANT
KIT SUCTION CATH 14FR (SUCTIONS) ×2 IMPLANT
NS IRRIG 1000ML POUR BTL (IV SOLUTION) ×6 IMPLANT
PACK CHEST (CUSTOM PROCEDURE TRAY) ×2 IMPLANT
PAD ARMBOARD 7.5X6 YLW CONV (MISCELLANEOUS) ×4 IMPLANT
POUCH ENDO CATCH II 15MM (MISCELLANEOUS) ×2 IMPLANT
POUCH SPECIMEN RETRIEVAL 10MM (ENDOMECHANICALS) ×1 IMPLANT
RELOAD STAPLE 35X2.5 WHT THIN (STAPLE) IMPLANT
RELOAD STAPLE 60 3.8 GOLD REG (STAPLE) IMPLANT
RELOAD STAPLE 60 4.1 GRN THCK (STAPLE) IMPLANT
RELOAD STAPLER GOLD 60MM (STAPLE) ×8 IMPLANT
RELOAD STAPLER GREEN 60MM (STAPLE) ×1 IMPLANT
SCISSORS ENDO CVD 5DCS (MISCELLANEOUS) IMPLANT
SEALANT PROGEL (MISCELLANEOUS) IMPLANT
SEALANT SURG COSEAL 4ML (VASCULAR PRODUCTS) IMPLANT
SEALANT SURG COSEAL 8ML (VASCULAR PRODUCTS) IMPLANT
SHEARS HARMONIC HDI 20CM (ELECTROSURGICAL) ×1 IMPLANT
SOLUTION ANTI FOG 6CC (MISCELLANEOUS) ×2 IMPLANT
SPECIMEN JAR MEDIUM (MISCELLANEOUS) ×4 IMPLANT
SPONGE GAUZE 4X4 12PLY STER LF (GAUZE/BANDAGES/DRESSINGS) ×1 IMPLANT
SPONGE INTESTINAL PEANUT (DISPOSABLE) ×1 IMPLANT
SPONGE TONSIL 1 RF SGL (DISPOSABLE) ×3 IMPLANT
STAPLE ECHEON FLEX 60 POW ENDO (STAPLE) ×1 IMPLANT
STAPLE RELOAD 2.5MM WHITE (STAPLE) ×12 IMPLANT
STAPLER ENDO GIA 12 SHRT THIN (STAPLE) IMPLANT
STAPLER ENDO GIA 12MM SHORT (STAPLE) IMPLANT
STAPLER RELOAD GOLD 60MM (STAPLE) ×16
STAPLER RELOAD GREEN 60MM (STAPLE) ×2
STAPLER VASCULAR ECHELON 35 (CUTTER) ×1 IMPLANT
SUT PROLENE 4 0 RB 1 (SUTURE)
SUT PROLENE 4-0 RB1 .5 CRCL 36 (SUTURE) IMPLANT
SUT SILK  1 MH (SUTURE) ×1
SUT SILK 1 MH (SUTURE) ×1 IMPLANT
SUT SILK 1 TIES 10X30 (SUTURE) ×2 IMPLANT
SUT SILK 2 0 SH (SUTURE) IMPLANT
SUT SILK 2 0SH CR/8 30 (SUTURE) IMPLANT
SUT SILK 3 0 SH 30 (SUTURE) IMPLANT
SUT SILK 3 0SH CR/8 30 (SUTURE) IMPLANT
SUT VIC AB 0 CTX 27 (SUTURE) IMPLANT
SUT VIC AB 1 CTX 27 (SUTURE) ×2 IMPLANT
SUT VIC AB 2-0 CT1 27 (SUTURE)
SUT VIC AB 2-0 CT1 TAPERPNT 27 (SUTURE) IMPLANT
SUT VIC AB 2-0 CTX 36 (SUTURE) ×2 IMPLANT
SUT VIC AB 3-0 MH 27 (SUTURE) IMPLANT
SUT VIC AB 3-0 SH 27 (SUTURE)
SUT VIC AB 3-0 SH 27X BRD (SUTURE) IMPLANT
SUT VIC AB 3-0 X1 27 (SUTURE) ×2 IMPLANT
SUT VICRYL 0 UR6 27IN ABS (SUTURE) ×5 IMPLANT
SUT VICRYL 2 TP 1 (SUTURE) IMPLANT
SWAB COLLECTION DEVICE MRSA (MISCELLANEOUS) IMPLANT
SYRINGE 10CC LL (SYRINGE) ×2 IMPLANT
SYSTEM SAHARA CHEST DRAIN ATS (WOUND CARE) ×2 IMPLANT
TAPE CLOTH SURG 4X10 WHT LF (GAUZE/BANDAGES/DRESSINGS) ×1 IMPLANT
TIP APPLICATOR SPRAY EXTEND 16 (VASCULAR PRODUCTS) IMPLANT
TOWEL OR 17X24 6PK STRL BLUE (TOWEL DISPOSABLE) ×2 IMPLANT
TOWEL OR 17X26 10 PK STRL BLUE (TOWEL DISPOSABLE) ×4 IMPLANT
TRAP SPECIMEN MUCOUS 40CC (MISCELLANEOUS) IMPLANT
TRAY FOLEY CATH 16FRSI W/METER (SET/KITS/TRAYS/PACK) ×2 IMPLANT
TROCAR XCEL BLADELESS 5X75MML (TROCAR) ×3 IMPLANT
TROCAR XCEL NON-BLD 5MMX100MML (ENDOMECHANICALS) IMPLANT
TUBE ANAEROBIC SPECIMEN COL (MISCELLANEOUS) IMPLANT
TUNNELER SHEATH ON-Q 11GX8 DSP (PAIN MANAGEMENT) ×1 IMPLANT
WATER STERILE IRR 1000ML POUR (IV SOLUTION) ×2 IMPLANT

## 2016-06-14 NOTE — Interval H&P Note (Signed)
History and Physical Interval Note:  06/14/2016 7:08 AM  Monica [name]  has presented today for surgery, with the diagnosis of RUL NODULE  The various methods of treatment have been discussed with the patient and family. After consideration of risks, benefits and other options for treatment, the patient has consented to  Procedure(s): VIDEO ASSISTED THORACOSCOPY (VATS)/WEDGE RESECTION (Right) POSSIBLE UPPER LOBECTOMY (Right) as a surgical intervention .  The patient's history has been reviewed, patient examined, no change in status, stable for surgery.  I have reviewed the patient's chart and labs.  Questions were answered to the patient's satisfaction.     Melrose Nakayama

## 2016-06-14 NOTE — Transfer of Care (Signed)
Immediate Anesthesia Transfer of Care Note  Patient: Monica [name]  Procedure(s) Performed: Procedure(s): VIDEO ASSISTED THORACOSCOPY (VATS)/WEDGE RESECTION (Right) RIGHT UPPER LOBECTOMY (Right) RIGHT UPPER LOBE LYMPH NODE DISSECTION (Right)  Patient Location: PACU  Anesthesia Type:General  Level of Consciousness: awake and alert   Airway & Oxygen Therapy: Patient Spontanous Breathing and Patient connected to nasal cannula oxygen  Post-op Assessment: Report given to RN and Post -op Vital signs reviewed and stable  Post vital signs: Reviewed and stable  Last Vitals:  Vitals:   06/14/16 0719 06/14/16 1141  BP:    Pulse: 77   Resp: 15   Temp:  36.4 C    Last Pain:  Vitals:   06/14/16 0554  TempSrc: Oral      Patients Stated Pain Goal: 4 (21/11/55 2080)  Complications: No apparent anesthesia complications

## 2016-06-14 NOTE — Anesthesia Procedure Notes (Addendum)
Central Venous Catheter Insertion Performed by: anesthesiologist 06/14/2016 7:03 AM Patient location: Pre-op. Preanesthetic checklist: patient identified, IV checked, site marked, risks and benefits discussed, surgical consent, monitors and equipment checked, pre-op evaluation, timeout performed and anesthesia consent Position: Trendelenburg Lidocaine 1% used for infiltration Landmarks identified and Seldinger technique used Catheter size: 7 Fr Central line was placed.Double lumen Procedure performed using ultrasound guided technique. Attempts: 1 Following insertion, line sutured, dressing applied and Biopatch. Post procedure assessment: blood return through all ports, free fluid flow and no air. Patient tolerated the procedure well with no immediate complications.

## 2016-06-14 NOTE — Brief Op Note (Addendum)
06/14/2016  11:18 AM  PATIENT:  Monica [name]  50 y.o. 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  PROCEDURE:   RIGHT VIDEO ASSISTED THORACOSCOPY (VATS),  RIGHT UPPER LOBE WEDGE RESECTION, THORACOSCOPIC  RIGHT UPPER LOBECTOMY,  MEDIASTINAL LYMPH NODE DISSECTION, and  ON Q LOCAL ANESTHETIC CATHETER PLACEMENT  SURGEON:  Surgeon(s) and Role:    * Melrose Nakayama, MD - Primary  PHYSICIAN ASSISTANT: Lars Pinks PA-C  ANESTHESIA:   general  EBL:  Total I/O In: 1100 [I.V.:1100] Out: 150 [Urine:150]  BLOOD ADMINISTERED:none  DRAINS: 28 French chest tube   LOCAL MEDICATIONS USED:  MARCAINE     SPECIMEN:  Source of Specimen:  Wedge of RUL, RUL, multiple lymph nodes  DISPOSITION OF SPECIMEN:  PATHOLOGY. Frozen of wedge of RUL showed adenocarcinoma.  COUNTS CORRECT:  YES   PLAN OF CARE: Admit to inpatient   PATIENT DISPOSITION:  PACU - hemodynamically stable.   Delay start of Pharmacological VTE agent (>24hrs) due to surgical blood loss or risk of bleeding: yes  Frozen of RUL nodule showed adenocarcinoma. Bronchial margin negative for tumor

## 2016-06-14 NOTE — Anesthesia Procedure Notes (Signed)
Procedure Name: Intubation Date/Time: 06/14/2016 7:38 AM Performed by: Candis Shine Pre-anesthesia Checklist: Patient identified, Emergency Drugs available, Suction available and Patient being monitored Patient Re-evaluated:Patient Re-evaluated prior to inductionOxygen Delivery Method: Circle System Utilized Preoxygenation: Pre-oxygenation with 100% oxygen Intubation Type: IV induction Ventilation: Mask ventilation without difficulty Laryngoscope Size: Mac and 3 Grade View: Grade I Tube type: Oral Endobronchial tube: Right, Double lumen EBT, EBT position confirmed by fiberoptic bronchoscope and EBT position confirmed by auscultation and 37 Fr Number of attempts: 1 Airway Equipment and Method: Stylet and Oral airway Placement Confirmation: ETT inserted through vocal cords under direct vision,  positive ETCO2 and breath sounds checked- equal and bilateral Secured at: 27 cm Tube secured with: Tape Dental Injury: Teeth and Oropharynx as per pre-operative assessment

## 2016-06-14 NOTE — Anesthesia Postprocedure Evaluation (Signed)
Anesthesia Post Note  Patient: Monica [name]  Procedure(s) Performed: Procedure(s) (LRB): VIDEO ASSISTED THORACOSCOPY (VATS)/WEDGE RESECTION (Right) RIGHT UPPER LOBECTOMY (Right) RIGHT UPPER LOBE LYMPH NODE DISSECTION (Right)  Patient location during evaluation: PACU Anesthesia Type: General Level of consciousness: sedated Pain management: pain level controlled Vital Signs Assessment: post-procedure vital signs reviewed and stable Respiratory status: spontaneous breathing and respiratory function stable Cardiovascular status: stable Anesthetic complications: no    Last Vitals:  Vitals:   06/14/16 1235 06/14/16 1244  BP:  133/71  Pulse:  78  Resp: 14 12  Temp:      Last Pain:  Vitals:   06/14/16 1235  TempSrc:   PainSc: 8                  Ryosuke Ericksen DANIEL

## 2016-06-14 NOTE — H&P (View-Only) (Signed)
PCP is [name], MD Referring Provider is [name], MD  Chief Complaint  Patient presents with  . Lung Lesion    CT CHEST 06/03/16, PET 06/07/16    HPI: Monica [name] is a 50 year old woman who is a lifelong nonsmoker. She has a past medical history significant for sarcoidosis, hypertension, and hypothyroidism. She was diagnosed with sarcoidosis back in 2003. She was initially treated with prednisone. She was followed with CTs every 6 months for a while and then annual chest x-rays after that. She recently saw Dr. Danise Mina for follow-up visit. She had not had a chest x-ray in a while so one was ordered. It showed a right upper lobe lung nodule. A CT scan confirmed an 11 x 11 mm spiculated right upper lobe nodule. There was no mediastinal or hilar adenopathy. A PET CT showed a nodule was hypermetabolic with an SUV of 3.5. There was no suspicious adenopathy or other areas of hypermetabolism.  She feels well. She denies cough, hemoptysis, wheezing, shortness of breath, chest pain, pressure, or tightness. She denies any change in appetite or weight loss. She has not had any headaches or visual changes.  Zubrod Score: At the time of surgery this patient's most appropriate activity status/level should be described as: _0     0    Normal activity, no symptoms _1     1    Restricted in physical strenuous activity but ambulatory, able to do out light work _2     2    Ambulatory and capable of self care, unable to do work activities, up and about >50 % of waking hours                              _3     3    Only limited self care, in bed greater than 50% of waking hours _4     4    Completely disabled, no self care, confined to bed or chair _5     5    Moribund    Past Medical History:  Diagnosis Date  . History of chicken pox   . History of hypertension   . Hypothyroidism   . Sarcoidosis ([place]) ~2004   treated with prendisone and stable since then    Past Surgical History:   Procedure Laterality Date  . EYE SURGERY Bilateral 1998   Lasik  . TUBAL LIGATION  2001    Family History  Problem Relation Age of Onset  . Cancer Mother 72    breast with met to bone  . Hypertension Mother   . Cancer Father 63    prostate  . CAD Maternal Grandmother     MI  . Stroke Maternal Grandmother   . Stroke Paternal Grandmother   . CAD Paternal Grandmother   . Diabetes Paternal Grandfather     Social History Social History  Substance Use Topics  . Smoking status: Never Smoker  . Smokeless tobacco: Never Used  . Alcohol use No    Current Outpatient Prescriptions  Medication Sig Dispense Refill  . Cholecalciferol (VITAMIN D3) 1000 units CAPS Take 2 capsules (2,000 Units total) by mouth daily. 30 capsule   . ibuprofen (ADVIL,MOTRIN) 200 MG tablet Take 600-800 mg by mouth every 6 (six) hours as needed for moderate pain.    Marland Kitchen levothyroxine (SYNTHROID, LEVOTHROID) 150 MCG tablet Take 1 tablet (150 mcg total) by mouth daily before breakfast. 30 tablet 6   No current facility-administered medications  for this visit.     No Known Allergies  Review of Systems  Constitutional: Negative for activity change, appetite change, chills, fever and unexpected weight change.  HENT: Negative for trouble swallowing and voice change.   Eyes: Negative for visual disturbance.  Respiratory: Negative for cough and wheezing.   Cardiovascular: Negative for chest pain, palpitations and leg swelling.  Gastrointestinal: Negative for abdominal pain and blood in stool.  Endocrine: Negative for polydipsia and polyuria.  Genitourinary: Negative for difficulty urinating and dysuria.  Musculoskeletal: Positive for arthralgias and joint swelling (right thumb).  Neurological: Negative for dizziness, syncope and weakness.  Hematological: Negative for adenopathy. Does not bruise/bleed easily.  All other systems reviewed and are negative.   Ht 5' 1.5" (1.562 m)   Wt 194 lb (88 kg)   BMI 36.06  kg/m  Physical Exam  Constitutional: She is oriented to person, place, and time. She appears well-developed and well-nourished. No distress.  HENT:  Head: Normocephalic and atraumatic.  Mouth/Throat: No oropharyngeal exudate.  Eyes: Conjunctivae and EOM are normal. No scleral icterus.  Neck: Neck supple. No thyromegaly present.  Cardiovascular: Normal rate, regular rhythm, normal heart sounds and intact distal pulses.  Exam reveals no gallop and no friction rub.   No murmur heard. Pulmonary/Chest: Effort normal and breath sounds normal. No respiratory distress. She has no wheezes. She has no rales.  Abdominal: Soft. She exhibits no distension. There is no tenderness.  Musculoskeletal: She exhibits no edema.  Lymphadenopathy:    She has no cervical adenopathy.  Neurological: She is alert and oriented to person, place, and time. No cranial nerve deficit.  Skin: Skin is warm and dry.  Psychiatric: She has a normal mood and affect.  Vitals reviewed.    Diagnostic Tests: CT CHEST WITH CONTRAST  TECHNIQUE: Multidetector CT imaging of the chest was performed during intravenous contrast administration.  CONTRAST:  44m ISOVUE-300 IOPAMIDOL (ISOVUE-300) INJECTION 61%  COMPARISON:  Chest x-ray 05/25/2016  FINDINGS: Cardiovascular: Cardiac size is unremarkable. No pericardial effusion. Central pulmonary artery and thoracic aorta is unremarkable.  Mediastinum/Nodes: No mediastinal hematoma or adenopathy. No hilar adenopathy.  Lungs/Pleura: Images of the lung parenchyma shows no infiltrate or pulmonary edema. As noted on chest x-ray there is a spiculated pleural based nodule in right upper lobe laterally measures 11 x 11 mm. This is best seen in coronal image 63. This is highly suspicious for malignancy. Further correlation with PET scan and/or biopsy is recommended. There is a nodule in right upper lobe posteriorly measures 6 mm. A ground glass nodule in left upper lobe  laterally measures 4 mm. Ground-glass nodule in superior segment of right lower lobe measures 4.6 mm. A nodule in right middle lobe measures 4 mm. There is a left apical nodule measures 6 mm. 4 mm nodule is noted in right upper lobe anteriorly. Axial image 55.  Upper Abdomen: The visualized upper abdomen shows no evidence of adrenal gland mass. Mild fatty infiltration of the liver. Visualized pancreas and spleen is unremarkable.  Musculoskeletal:  Sagittal images of the spine shows mild degenerative changes thoracic spine. Small hiatal hernia  IMPRESSION: 1. As noted on chest x-ray there is a pleural based spiculated nodule in right upper lobe laterally measures 1.1 cm. Further correlation with PET scan and/or biopsy is recommended to exclude malignancy. 2. Additional subcentimeter nodules are noted bilaterally. The largest nodule in left apex measures 6 mm. Metastatic disease cannot be excluded. Follow-up CT scan in 3 months is recommended to assure stability.  3. No infiltrate or pulmonary edema. 4. Degenerative changes thoracic spine.  No hilar adenopathy. 5. Small hiatal hernia. These results were called by telephone at the time of interpretation on 06/03/2016 at 2:41 pm to Dr. [name] , who verbally acknowledged these results.   Electronically Signed   By: Lahoma Crocker M.D.   On: 06/03/2016 14:42  NUCLEAR MEDICINE PET SKULL BASE TO THIGH  TECHNIQUE: 12.6 mCi F-18 FDG was injected intravenously. Full-ring PET imaging was performed from the skull base to thigh after the radiotracer. CT data was obtained and used for attenuation correction and anatomic localization.  FASTING BLOOD GLUCOSE:  Value: 113 mg/dl  COMPARISON:  CT chest 06/03/2016.  FINDINGS: NECK  No hypermetabolic lymph nodes in the neck. CT images show no acute findings.  CHEST  No hypermetabolic mediastinal, hilar or axillary lymph nodes. A 9 mm spiculated nodule in the apical  segment right upper lobe (CT image 69) has an SUV max of 3.5. No additional hypermetabolic pulmonary nodules. No pericardial or pleural effusion.  ABDOMEN/PELVIS  No abnormal hypermetabolism in the liver, adrenal glands, spleen or pancreas. No hypermetabolic lymph nodes. Sub cm low-attenuation lesion in the dome of the liver is too small to characterize. Liver, gallbladder, adrenal glands and right kidney are unremarkable. 1.7 cm low-attenuation lesion in the interpolar left kidney is difficult to further characterize without post-contrast imaging. Spleen, pancreas, stomach and bowel are grossly unremarkable. No free fluid.  SKELETON  No abnormal osseous hypermetabolism.  IMPRESSION: Malignant range hypermetabolism within a spiculated right upper lobe nodule, highly worrisome for stage IA primary bronchogenic carcinoma.   Electronically Signed   By: Lorin Picket M.D.   On: 06/07/2016 09:32  I personally reviewed the CT and PET CT images and concur with the findings noted above.  Impression: 50 year old woman with a newly discovered spiculated right upper lobe nodule. This is approximately 1 cm in size and is hypermetabolic on PET/CT. It is suspicious for a new stage I primary bronchogenic carcinoma. A sarcoid nodule is also a possibility, although it normally doesn't present as a solitary nodule.  I reviewed the films with Monica [name] and her husband. We discussed the differential diagnosis. We discussed options for diagnosis and treatment. We discussed the relative advantages and disadvantages of CT-guided biopsy, bronchoscopic biopsy, or VATS for wedge resection and then possible lobectomy depending on intraoperative frozen section findings. She strongly favors proceeding with VATS to get a definitive diagnosis.  I recommended to Monica [name] that we proceed with right VATS, wedge resection, and possible right upper lobectomy. She understands the general nature  of the procedure, the incisions to be used, the need for general anesthesia, the use of a drainage tube postoperatively, the expected hospital stay, and the overall recovery. She also understands interrupted decision making regarding an anatomic resection. I informed her the indications, risks, benefits, and alternatives. She understands the risks include, but are not limited to death, MI, DVT, PE, bleeding, possible need for transfusion, infection, prolonged air leak, cardiac arrhythmias, as well as the possibility of other unforeseeable complications.  She accepts the risks and agrees to proceed.  Plan: Pulmonary function testing Right VATS, wedge resection, possible right upper lobectomy on Monday, 06/14/2016.  Melrose Nakayama, MD [facility] [phone]

## 2016-06-15 ENCOUNTER — Encounter: Payer: BLUE CROSS/BLUE SHIELD | Admitting: Thoracic Surgery (Cardiothoracic Vascular Surgery)

## 2016-06-15 ENCOUNTER — Inpatient Hospital Stay (HOSPITAL_COMMUNITY): Payer: BLUE CROSS/BLUE SHIELD

## 2016-06-15 ENCOUNTER — Encounter (HOSPITAL_COMMUNITY): Payer: Self-pay | Admitting: Thoracic Surgery (Cardiothoracic Vascular Surgery)

## 2016-06-15 LAB — GLUCOSE, CAPILLARY
GLUCOSE-CAPILLARY: 128 mg/dL — AB (ref 65–99)
GLUCOSE-CAPILLARY: 107 mg/dL — AB (ref 65–99)
GLUCOSE-CAPILLARY: 132 mg/dL — AB (ref 65–99)
Glucose-Capillary: 140 mg/dL — ABNORMAL HIGH (ref 65–99)

## 2016-06-15 LAB — BLOOD GAS, ARTERIAL
ACID-BASE DEFICIT: 0.6 mmol/L (ref 0.0–2.0)
BICARBONATE: 23.1 mmol/L (ref 20.0–28.0)
DRAWN BY: 41977
O2 Content: 2 L/min
O2 Saturation: 98.9 %
PH ART: 7.426 (ref 7.350–7.450)
PO2 ART: 129 mmHg — AB (ref 83.0–108.0)
Patient temperature: 98.6
pCO2 arterial: 35.8 mmHg (ref 32.0–48.0)

## 2016-06-15 LAB — BASIC METABOLIC PANEL
Anion gap: 7 (ref 5–15)
BUN: 9 mg/dL (ref 6–20)
CHLORIDE: 109 mmol/L (ref 101–111)
CO2: 22 mmol/L (ref 22–32)
CREATININE: 0.62 mg/dL (ref 0.44–1.00)
Calcium: 7.8 mg/dL — ABNORMAL LOW (ref 8.9–10.3)
GFR calc Af Amer: >60 mL/min (ref >60)
GFR calc non Af Amer: >60 mL/min (ref >60)
GLUCOSE: 135 mg/dL — AB (ref 65–99)
POTASSIUM: 4.5 mmol/L (ref 3.5–5.1)
SODIUM: 138 mmol/L (ref 135–145)

## 2016-06-15 LAB — CBC
HCT: 34.3 % — ABNORMAL LOW (ref 36.0–46.0)
Hemoglobin: 11.8 g/dL — ABNORMAL LOW (ref 12.0–15.0)
MCH: 28.2 pg (ref 26.0–34.0)
MCHC: 34.4 g/dL (ref 30.0–36.0)
MCV: 81.9 fL (ref 78.0–100.0)
PLATELETS: 182 10*3/uL (ref 150–400)
RBC: 4.19 MIL/uL (ref 3.87–5.11)
RDW: 13.5 % (ref 11.5–15.5)
WBC: 12.9 10*3/uL — AB (ref 4.0–10.5)

## 2016-06-15 MED ORDER — ENOXAPARIN SODIUM 40 MG/0.4ML ~~LOC~~ SOLN
40.0000 mg | SUBCUTANEOUS | Status: DC
  Administered 2016-06-15 – 2016-06-18 (×4): 40 mg via SUBCUTANEOUS
  Filled 2016-06-15 (×4): qty 0.4

## 2016-06-15 MED ORDER — DEXTROSE-NACL 5-0.45 % IV SOLN
INTRAVENOUS | Status: DC
  Administered 2016-06-15 – 2016-06-16 (×2): via INTRAVENOUS

## 2016-06-15 NOTE — Progress Notes (Signed)
      [address]       [place] [address]             [phone]       1 Day Post-Op Procedure(s) (LRB): VIDEO ASSISTED THORACOSCOPY (VATS)/WEDGE RESECTION (Right) RIGHT UPPER LOBECTOMY (Right) RIGHT UPPER LOBE LYMPH NODE DISSECTION (Right)   Subjective:  States she is doing okay.  Some pain which is controlled with pain medication. She states she does feel a little weak. Has no ambulated yet.  Objective: Vital signs in last 24 hours: Temp:  [97.5 F (36.4 C)-98.1 F (36.7 C)] 98 F (36.7 C) (11/14 0700) Pulse Rate:  [65-78] 73 (11/14 0700) Cardiac Rhythm: Normal sinus rhythm (11/14 0400) Resp:  [11-21] 18 (11/14 0700) BP: (120-150)/(69-89) 130/80 (11/14 0700) SpO2:  [97 %-100 %] 100 % (11/14 0700) Arterial Line BP: (126-162)/(62-78) 138/65 (11/14 0400) Weight:  [199 lb 4.7 oz (90.4 kg)] 199 lb 4.7 oz (90.4 kg) (11/13 1422)  Intake/Output from previous day: 11/13 0701 - 11/14 0700 In: 1200 [I.V.:1200] Out: 1565 [Urine:1375; Blood:50; Chest Tube:140]  General appearance: alert, cooperative and no distress Heart: regular rate and rhythm Lungs: clear to auscultation bilaterally Abdomen: soft, non-tender; bowel sounds normal; no masses,  no organomegaly Extremities: extremities normal, atraumatic, no cyanosis or edema Wound: clean and dry  Lab Results:  Recent Labs  06/15/16 0500  WBC 12.9*  HGB 11.8*  HCT 34.3*  PLT 182   BMET:  Recent Labs  06/15/16 0500  NA 138  K 4.5  CL 109  CO2 22  GLUCOSE 135*  BUN 9  CREATININE 0.62  CALCIUM 7.8*    PT/INR: No results for input(s): LABPROT, INR in the last 72 hours. ABG    Component Value Date/Time   PHART 7.426 06/15/2016 0305   HCO3 23.1 06/15/2016 0305   ACIDBASEDEF 0.6 06/15/2016 0305   O2SAT 98.9 06/15/2016 0305   CBG (last 3)   Recent Labs  06/14/16 1721 06/14/16 2330 06/15/16 0540  GLUCAP 133* 158* 128*    Assessment/Plan: S/P Procedure(s) (LRB): VIDEO ASSISTED  THORACOSCOPY (VATS)/WEDGE RESECTION (Right) RIGHT UPPER LOBECTOMY (Right) RIGHT UPPER LOBE LYMPH NODE DISSECTION (Right)  1. Chest tube- no air leak present, minimal apical pneumothorax on the right, 140 cc output since surgery- will place chest tube to water seal 2. Pulm- no acute issues, wean oxygen as tolerated, continue pulmonary toilet 3. D/C Arterial line 4. Decrease IV fluids 5. DVT prophylaxis- will start Lovenox 40 mg QD 6. CV- hemodynamically stable, NSR 7. Dispo- patient stable, doing well, will plan to remove foley in AM, repeat CXR   LOS: 1 day    Eliyahu Bille 06/15/2016

## 2016-06-15 NOTE — Progress Notes (Signed)
Nutrition Brief Note  Patient identified on the Malnutrition Screening Tool (MST) Report. Patient reports weight loss from doctor's appointment 2 weeks ago to admission yesterday. From review of usual weights below, no significant weight changes noted. She was eating very well PTA.   Wt Readings from Last 15 Encounters:  06/14/16 199 lb 4.7 oz (90.4 kg)  06/11/16 191 lb 12.8 oz (87 kg)  06/08/16 194 lb (88 kg)  05/21/16 194 lb (88 kg)  08/01/15 193 lb 9.6 oz (87.8 kg)  04/01/15 197 lb 12 oz (89.7 kg)  10/10/14 192 lb 12.8 oz (87.5 kg)  12/10/13 185 lb (83.9 kg)    Body mass index is 37.66 kg/m. Patient meets criteria for class 2 obesity based on current BMI.   Current diet order is Regular. She did not eat well at breakfast due to nausea. She is now feeling better and plans to gradually increase her intake. Patient reports being hungry. Labs and medications reviewed.    No nutrition interventions warranted at this time. If nutrition issues arise, please consult RD.   Molli Barrows, RD, LDN, [place] Pager [phone] After Hours Pager [phone]

## 2016-06-15 NOTE — Op Note (Signed)
NAMEADNA, NOFZIGER NO.:  [id number]  MEDICAL RECORD NO.:  [id number]  LOCATION:  3S11C                        FACILITY:  [facility]  PHYSICIAN:  Revonda Standard. Roxan Hockey, M.D.DATE OF BIRTH:  [birth year]  DATE OF PROCEDURE:  06/14/2016 DATE OF DISCHARGE:                              OPERATIVE REPORT   PREOPERATIVE DIAGNOSIS:  Right upper lobe nodule.  POSTOPERATIVE DIAGNOSIS:  Adenocarcinoma, right upper lobe, clinical stage IB.  PROCEDURE:   Right video-assisted thoracoscopy, Wedge resection of right upper lobe, Thoracoscopic right upper lobectomy, Mediastinal lymph node dissection,  On-Q local anesthetic catheter placement.  SURGEON:  Revonda Standard. Roxan Hockey, M.D.  ASSISTANT:  Lars Pinks, PA.  ANESTHESIA:  General.  FINDINGS:  A 1 cm mass with invagination of the visceral pleura noted in the lateral aspect of right upper lobe.  Frozen section revealed adenocarcinoma.  Frozen section of bronchial margin negative for tumor.  CLINICAL NOTE:  Monica [name] is a 50 year old woman with a history of sarcoid in the past who recently had a chest x-ray for followup.  There was a right upper lobe nodule. On CT scan, there was an 11 x 11 mm spiculated right upper lobe nodule with no mediastinal or hilar adenopathy.  On PET-CT, the nodule was hypermetabolic with an SUV of 3.5.  There was no evidence of metastatic disease.  The patient was advised to undergo wedge resection to be followed by right upper lobectomy if the nodule was positive on frozen section.  The indications, risks, benefits, and alternatives were discussed in detail with the patient.  She understood and accepted the risks and agreed to proceed.  OPERATIVE NOTE:  Monica [name] was brought to the preoperative holding area on June 14, 2016.  Anesthesia placed a central line and an arterial blood pressure monitoring line.  She was taken to the operating room, anesthetized, and intubated.   Sequential compression devices were placed on the calves for DVT prophylaxis.  Intravenous antibiotics were administered.  A Foley catheter was placed.  She was placed in a left lateral decubitus position, and the right chest was prepped and draped in usual sterile fashion.  Single lung ventilation of the left lung was initiated and was tolerated well throughout the procedure.  An incision made in the seventh intercostal space in the midaxillary line.  A 5 mm port was inserted into the chest.  A thoracoscope was advanced into the chest.  There was good isolation of the right lung, although it was relatively slow to deflate.  A working incision was made in the fourth interspace anterolaterally.  This was 4 cm in length.  No rib spreading was performed during the procedure.  The upper lobe was inspected.  The nodule was easily palpable.  There was invagination of the visceral pleura overlying the nodule.  A wedge resection was performed with sequential firings of an Echelon 60 mm stapler with gold cartridges.  The specimen was placed into an endoscopic retrieval bag and sent for frozen section.  While awaiting the results of the frozen section, an On-Q local anesthetic catheter was placed through a separate stab incision posteriorly and tunneled into a subpleural location.  It was primed with 5 mL  of 0.5% Marcaine.  The frozen section returned showing adenocarcinoma and the decision was made to proceed with right upper lobectomy as discussed with the patient preoperatively.  The inferior ligament was divided.  A small level 9 lymph node was removed.  All lymph nodes that were encountered were removed and sent as separate specimens for permanent pathology.  The pleural reflection then was divided at the hilum anteriorly and posteriorly as well as superiorly.  The right superior pulmonary vein was identified.  The middle lobe branch was identified and preserved.  The upper lobe branches  were dissected out.  The more anterior branches were encircled and divided with a vascular stapler.  The posterior branch was more difficult because of surrounding nodes. After these nodes were removed, the posterior vein branch was encircled and also divided with a vascular stapler.  Dissection continued superiorly and the anterior and apical pulmonary arterial branches were identified. Again there were relatively large, but otherwise benign-appearing, nodes around these vessels.  These were again removed and sent for pathology.  These vessels were divided with the endoscopic vascular stapler separately.  Dissection then was carried along the pulmonary artery and the posterior ascending branch was identified.  It was encircled and divided with a vascular stapler as well.  The lung then was retracted anteriorly.  There was a large level 11 lymph node at the bifurcation of the upper lobe bronchus from the bronchus intermedius.  This was removed.  The right upper lobe bronchus then was encircled.  An echelon stapler with a green cartridge was placed across the bronchus and closed.  A test inflation showed good aeration of the lower and middle lobes.  The stapler was fired transecting the bronchus.  The lobectomy was completed using the Echelon stapler with gold cartridges to complete the minor and major fissures. The middle lobe was tacked to the lower lobe with the stapler as well to prevent torsion.  There were 2 large level 7 nodes that again otherwise appeared benign.  These were removed and the 4R nodes were removed as well.  The chest was copiously irrigated with warm saline.  A test inflation to 30 cm of water showed no leakage from the bronchial stump. A 28-French chest tube was placed through a separate stab incision more anteriorly, and this was directed to the apex.  It was secured to skin with #1 silk suture.  The camera port incision was closed with a #1 Vicryl fascial suture  and a 3-0 Vicryl subcuticular suture.  The working incision was closed with a #1 Vicryl fascial suture, followed by routine closure of the subcutaneous tissue and skin.  All sponge, needle, and instrument counts were correct at the end of the procedure.  The patient was placed back in supine position.  The chest tubes were placed to suction.  She was extubated in the operating room and taken to postanesthetic care unit in good condition.     Revonda Standard Roxan Hockey, M.D.     SCH/MEDQ  D:  06/14/2016  T:  06/15/2016  Job:  [id number]

## 2016-06-16 ENCOUNTER — Inpatient Hospital Stay (HOSPITAL_COMMUNITY): Payer: BLUE CROSS/BLUE SHIELD

## 2016-06-16 LAB — CBC
HEMATOCRIT: 33.7 % — AB (ref 36.0–46.0)
Hemoglobin: 11 g/dL — ABNORMAL LOW (ref 12.0–15.0)
MCH: 27.5 pg (ref 26.0–34.0)
MCHC: 32.6 g/dL (ref 30.0–36.0)
MCV: 84.3 fL (ref 78.0–100.0)
Platelets: 218 10*3/uL (ref 150–400)
RBC: 4 MIL/uL (ref 3.87–5.11)
RDW: 14.1 % (ref 11.5–15.5)
WBC: 11.4 10*3/uL — AB (ref 4.0–10.5)

## 2016-06-16 LAB — COMPREHENSIVE METABOLIC PANEL
ALT: 10 U/L — AB (ref 14–54)
AST: 16 U/L (ref 15–41)
Albumin: 2.8 g/dL — ABNORMAL LOW (ref 3.5–5.0)
Alkaline Phosphatase: 43 U/L (ref 38–126)
Anion gap: 6 (ref 5–15)
BILIRUBIN TOTAL: 1.2 mg/dL (ref 0.3–1.2)
BUN: 10 mg/dL (ref 6–20)
CO2: 24 mmol/L (ref 22–32)
CREATININE: 0.65 mg/dL (ref 0.44–1.00)
Calcium: 8.2 mg/dL — ABNORMAL LOW (ref 8.9–10.3)
Chloride: 110 mmol/L (ref 101–111)
Glucose, Bld: 104 mg/dL — ABNORMAL HIGH (ref 65–99)
Potassium: 3.7 mmol/L (ref 3.5–5.1)
Sodium: 140 mmol/L (ref 135–145)
TOTAL PROTEIN: 5 g/dL — AB (ref 6.5–8.1)

## 2016-06-16 LAB — GLUCOSE, CAPILLARY
GLUCOSE-CAPILLARY: 114 mg/dL — AB (ref 65–99)
Glucose-Capillary: 93 mg/dL (ref 65–99)

## 2016-06-16 NOTE — Progress Notes (Signed)
2 Days Post-Op Procedure(s) (LRB): VIDEO ASSISTED THORACOSCOPY (VATS)/WEDGE RESECTION (Right) RIGHT UPPER LOBECTOMY (Right) RIGHT UPPER LOBE LYMPH NODE DISSECTION (Right) Subjective: Some incisional pain  Objective: Vital signs in last 24 hours: Temp:  [97.7 F (36.5 C)-98.7 F (37.1 C)] 98.5 F (36.9 C) (11/15 0359) Pulse Rate:  [60-79] 79 (11/15 0359) Cardiac Rhythm: Normal sinus rhythm (11/15 0739) Resp:  [14-22] 15 (11/15 0400) BP: (108-147)/(61-76) 119/63 (11/15 0359) SpO2:  [97 %-100 %] 97 % (11/15 0400)  Hemodynamic parameters for last 24 hours:    Intake/Output from previous day: 11/14 0701 - 11/15 0700 In: 2290 [I.V.:2290] Out: 1240 [Urine:1200; Chest Tube:40] Intake/Output this shift: Total I/O In: 100 [I.V.:100] Out: -   General appearance: alert, cooperative and no distress Neurologic: intact Heart: regular rate and rhythm Lungs: diminished breath sounds bibasilar Abdomen: normal findings: soft, non-tender small air leak, with large tidal variation  Lab Results:  Recent Labs  06/15/16 0500 06/16/16 0556  WBC 12.9* 11.4*  HGB 11.8* 11.0*  HCT 34.3* 33.7*  PLT 182 218   BMET:  Recent Labs  06/15/16 0500 06/16/16 0556  NA 138 140  K 4.5 3.7  CL 109 110  CO2 22 24  GLUCOSE 135* 104*  BUN 9 10  CREATININE 0.62 0.65  CALCIUM 7.8* 8.2*    PT/INR: No results for input(s): LABPROT, INR in the last 72 hours. ABG    Component Value Date/Time   PHART 7.426 06/15/2016 0305   HCO3 23.1 06/15/2016 0305   ACIDBASEDEF 0.6 06/15/2016 0305   O2SAT 98.9 06/15/2016 0305   CBG (last 3)   Recent Labs  06/15/16 1715 06/15/16 2258 06/16/16 0513  GLUCAP 132* 140* 114*    Assessment/Plan: S/P Procedure(s) (LRB): VIDEO ASSISTED THORACOSCOPY (VATS)/WEDGE RESECTION (Right) RIGHT UPPER LOBECTOMY (Right) RIGHT UPPER LOBE LYMPH NODE DISSECTION (Right) -POD # 2 CV- stable  RESP- s/p RUL. Questionable air leak with large tidal movement, may be  sucking air in around tube, will reassess later today  Continue IS  RENAL- no issues  ENDO- CBG OK  SCD + enoxaparin for DVT prophylaxis  AMBULATE   LOS: 2 days    Melrose Nakayama 06/16/2016

## 2016-06-16 NOTE — Discharge Instructions (Signed)
Video-Assisted Thoracic Surgery, Care After Refer to this sheet in the next few weeks. These instructions provide you with information on caring for yourself after your procedure. Your doctor may also give you more specific instructions. Your procedure has been planned according to current medical practices, but problems sometimes occur. Call your doctor if you have any problems or questions after your procedure. Follow these instructions at home:  Only take over-the-counter or prescription medicines as told by your doctor.  Only take pain medicines (narcotics) as told by your doctor.  Do not drive until your doctor says it is OK. Driving while taking pain medicines or soon after surgery can be dangerous.  Avoid activities that use your chest muscles for at least 3-4 weeks. These include lifting heavy objects.  Take deep breaths. This expands the lungs and protects against a lung infection (pneumonia).  Do breathing exercises as told by your doctor. If you were given a device to help with breathing, use it as told by your doctor.  You may resume a normal diet and activities when you feel you are able to or as told by your doctor.  Do not take a bath until your doctor says it is OK. Use the shower instead.  Keep the bandage (dressing) covering the area where the chest tube was put (incision site) dry for 48 hours. Remove the bandage after 48 hours unless there is new fluid on the bandage.  Remove bandages as told by your doctor.  Change bandages if necessary or as told by your doctor.  Keep all follow-up doctor visits. It is important to see your doctor after surgery. You may need follow-up care and your condition may need to be watched. Get help right away if:  You have a fever.  You have chest pain.  You have a rash.  You have shortness of breath.  You have trouble breathing.  You feel weak, lightheaded, dizzy, or faint.  You feel a lot of pain near an area where a surgical  cut was made.  The pain at an area where a surgical cut was made is getting worse.  You notice bleeding, fluid, or pus coming from where a surgical cut was made.  You notice irritation, puffiness (swelling), or redness near the surgical cut.  There is a bad smell coming from from a bandage or from an area where a surgical cut was made.  It feels like your heart is fluttering or beating rapidly.  Your pain medicine does not relieve your pain. This information is not intended to replace advice given to you by your health care provider. Make sure you discuss any questions you have with your health care provider. Document Released: 11/13/2012 Document Revised: 12/25/2015 Document Reviewed: 09/05/2012 Elsevier Interactive Patient Education  2017 [employer].

## 2016-06-16 NOTE — Discharge Summary (Signed)
Physician Discharge Summary  Patient ID: JEZELLE GULLICK MRN: [id number] DOB/AGE: 05-Jan-1966 50 y.o.  Admit date: 06/14/2016 Discharge date: 06/18/2016  Admission Diagnoses:  Right upper lobe lung nodule  Discharge Diagnoses:    Adenocarcinoma right upper lobe- Stage IA  Patient Active Problem List   Diagnosis Date Noted  . Nodule of right lung 06/14/2016  . Pulmonary nodule 05/28/2016  . Health maintenance examination 04/01/2015  . Vitamin D deficiency 04/01/2015  . Obesity, Class II, BMI 35-39.9, no comorbidity 04/01/2015  . History of sarcoidosis 10/10/2014  . Hypothyroidism 10/10/2014     HPI:At time of evaluation.   Mrs. Singleterry is a 50 year old woman who is a lifelong nonsmoker. She has a past medical history significant for sarcoidosis, hypertension, and hypothyroidism. She was diagnosed with sarcoidosis back in 2003. She was initially treated with prednisone. She was followed with CTs every 6 months for a while and then annual chest x-rays after that. She recently saw Dr. Danise Mina for follow-up visit. She had not had a chest x-ray in a while so one was ordered. It showed a right upper lobe lung nodule. A CT scan confirmed an 11 x 11 mm spiculated right upper lobe nodule. There was no mediastinal or hilar adenopathy. A PET CT showed a nodule was hypermetabolic with an SUV of 3.5. There was no suspicious adenopathy or other areas of hypermetabolism.  She feels well. She denies cough, hemoptysis, wheezing, shortness of breath, chest pain, pressure, or tightness. She denies any change in appetite or weight loss. She has not had any headaches or visual changes.  She was admitted to the hospital electively for resection.  Discharged Condition: good  Hospital Course: The patient was admitted electively and taken to the operating room where she underwent the below described procedure. She tolerated well was taken to the postanesthesia care unit in stable condition.   Postoperative hospital course:  Overall the patient has progressed nicely. She has remained hemodynamically stable. Chest tubes have been removed in the standard fashion with serial chest x-ray evaluations. All other routine lines and monitoring devices have been discontinued in the standard fashion. She is tolerating gradually increasing activities using standard protocols. Incisions are noted to be healing well without evidence of infection. Blood sugars have been under adequate control. She does have a moderate postoperative acute blood loss anemia which is stable. Renal function has remained within normal limits. Oxygen is being weaned in the standard fashion. At time of discharge she is felt to be quite stable.  Consults: None  Significant Diagnostic Studies: Routine postoperative laboratory in CXR  Treatments: surgery:  DATE OF PROCEDURE:  06/14/2016 DATE OF DISCHARGE:                              OPERATIVE REPORT   PREOPERATIVE DIAGNOSIS:  Right upper lobe nodule.  POSTOPERATIVE DIAGNOSIS:  Adenocarcinoma, right upper lobe, clinical stage IB.  PROCEDURE:  Right video-assisted thoracoscopy, wedge resection of right upper lobe, thoracoscopic right upper lobectomy, mediastinal lymph node dissection, On-Q local anesthetic catheter placement.  SURGEON:  Revonda Standard. Roxan Hockey, M.D.  ASSISTANT:  Lars Pinks, PA.  ANESTHESIA:  General.  FINDINGS:  A 1 cm mass with invagination of the visceral pleura noted in the lateral aspect of right upper lobe.  Frozen section revealed adenocarcinoma.  Frozen section of bronchial margin negative for tumor.  Pathology:  Diagnosis 1. Lung, wedge biopsy/resection, Right upper lobe - ADENOCARCINOMA, WELL-DIFFERENTIATED, SPANNING 1.2 CM. -  TUMOR LIMITED TO LUNG. - RESECTION MARGINS IS NEGATIVE. - SEE ONCOLOGY TABLE. 2. Lung, wedge biopsy/resection, Right upper lobe - MULTIFOCAL ATYPICAL ADENOMATOUS HYPERPLASIA, SEE COMMENT. -  RESECTION MARGINS ARE NEGATIVE. 3. Lymph node, biopsy, 7 - ONE OF ONE LYMPH NODES NEGATIVE FOR CARCINOMA (0/1). 4. Lymph node, biopsy, 10 R - ONE OF ONE LYMPH NODES NEGATIVE FOR CARCINOMA (0/1). 5. Lymph node, biopsy, 10 r #2 - ONE OF ONE LYMPH NODES NEGATIVE FOR CARCINOMA (0/1). 6. Lymph node, biopsy, 10 R #3 - ONE OF ONE LYMPH NODES NEGATIVE FOR CARCINOMA (0/1). 7. Lymph node, biopsy, 11 R - ONE OF ONE LYMPH NODES NEGATIVE FOR CARCINOMA (0/1). 8. Lymph node, biopsy, 10 R #4 - ONE OF ONE LYMPH NODES NEGATIVE FOR CARCINOMA (0/1). 9. Lymph node, biopsy, 11 R #2 - ONE OF ONE LYMPH NODES NEGATIVE FOR CARCINOMA (0/1). 10. Lymph node, biopsy, 4 R - ONE OF ONE LYMPH NODES NEGATIVE FOR CARCINOMA (0/1). 11. Lymph node, biopsy, 11 R #3 - ONE OF ONE LYMPH NODES NEGATIVE FOR CARCINOMA (0/1). 1 of 4 FINAL for LADASIA, SIRCY B [phone]) Diagnosis(continued) 12. Lymph node, biopsy, 9 - ONE OF ONE LYMPH NODES NEGATIVE FOR CARCINOMA (0/1). 13. Lymph node, biopsy, 4 R #2 - ONE OF ONE LYMPH NODES NEGATIVE FOR CARCINOMA (0/1). Microscopic Comment 1. LUNG Specimen, including laterality: Right upper lobe wedge and lobe with lymph node biopsies. Procedure: right upper lobe wedge resection and lobectomy. Lymph node biopsies. Specimen integrity (intact/disrupted): Intact. Tumor site: Right upper lobe. Tumor focality: Unifocal, see comment. Maximum tumor size (cm): 1.2 cm. Histologic type: Adenocarcinoma. Grade: G1, well-differentiated. Margins: Negative. Distance to closest margin (cm): 0.5 cm original, >1 cm final bronchial. Visceral pleura invasion: Not identified. Tumor extension: Limited to lung. Treatment effect (if treated with neoadjuvant therapy): N/A. Lymph -Vascular invasion: Not identified. Lymph nodes: Number examined - 13 ; Number N1 nodes positive 0 ; Number N2 nodes positive 0 TNM code: pT1a, pN0 Ancillary studies: Can be performed upon request. Non-neoplastic lung: Multiple  foci of atypical adenomatous hyperplasia in complete lobectomy (part #2). Comments: Tumor with fibrous reaction extends to just beneath the pleural surface. In once section the pleura is partially disrupted limiting evaluation of pleural invasion, however, in the intact areas Orcein stain reveals no penetration of the visceral pleura and thus the tumor is staged as pT1a. 2. There are multiple foci (12) of atypical adenomatous hyperplasia (ranging in size from 0.2 cm to 0.5 cm). Vicente Males MD Pathologist, Electronic Signature (Case signed 06/16/2016) Intraoperative Diagnosis 1. FROZEN SECTION DIAGNOSIS: RIGHT UPPER LOBE NODULE - ADENOCARCINOMA. MARGIN IS NEGATIVE. (RH). Specimen Gross and Clinical Information Specimen(s) Obtained: 1. Lung, wedge biopsy/resection, Right upper lobe 2. Lung, wedge biopsy/resection, Right upper lobe 3. Lymph node, biopsy, 7 4. Lymph node, biopsy, 10 R 5. Lymph node, biopsy, 10 r #2 6. Lymph node, biopsy, 10 R #3 2 of 4 FINAL for DAGNY, FIORENTINO B ([id number]) Specimen(s) Obtained:(continued) 7. Lymph node, biopsy, 11 R 8. Lymph node, biopsy, 10 R #4 9. Lymph node, biopsy, 11 R #2 10. Lymph node, biopsy, 4 R 11. Lymph node, biopsy, 11 R #3 12. Lymph node, biopsy, 9 13. Lymph node, biopsy, 4 R #2 Specimen Clinical Information 1. Right upper lobe lung nodule (tl) 2. (tl) Gross 1. Received fresh for intraoperative consult is a 7.5 x 2 x 2 cm wedge of lung, received with a 7.5 cm linear staple line that is removed and underinked black. The pleura is inked yellow. The  specimen is serially sectioned to reveal a 1.2 x 0.8 x 0.7 cm ill-defined, tan-white, firm lesion. This lesion is 0.5 cm from the closest black-inked staple margin and is associated with pleural puckering. The lesion is submitted entirely for frozen section diagnosis, perpendicular to the staple margin. The remaining parenchyma is red-brown , spongy, and unremarkable. Block Summary 1A-1D =  lesion entirely, frozen section remnant (AK 06/14/16) 2. Received fresh for intraoperative consultation is a 14 x 7 x 4.2 cm, 100 g lung lobectomy specimen. The bronchial and vascular margins are identified and submitted en face for frozen section. The pleural surface is tan-blue and smooth with a 7 cm linear staple line opposite the bronchial and vascular margins. The staple line is removed and underinked black. The specimen is serially sectioned to reveal spongy red-brown parenchyma and several ill-defined, firm tan areas averaging 0.5 cm in dimension throughout the specimen. One of these areas is 0.5 cm from the stapled margin, and the remaining areas are greater than 1 cm from the bronchial, vascular, and stapled margin. The vasculature and airways are patent without thrombose or mucus plugging. Two anthracotic bronchial lymph nodes are identified, averaging 0.7 cm in dimension. Block Summary: 2A = bronchial margin, frozen section remnant 2B = vascular margin, frozen section remnant 2C = two bronchial lymph nodes, whole 2D = representative stapled margin with closest firm area 2E-2G = remaining firm areas (AK 06/14/16) 3. Received fresh is a 1.2 x 1 x 0.7 cm anthracotic lymph node, bisected and submitted entirely in one cassette. 4. Received fresh is a 1.3 x 1 x 0.7 cm portion of yellow lobulated adipose tissue, bisected to reveal a 0.7 x 0.5 x 0.4 cm anthracotic lymph node. The specimen has been submitted entirely in one cassette. 5. Received fresh are two anthracotic lymph node fragments ranging from 0.5 to 1.2 x 1 x 0.8 cm, submitted entirely in one cassette. 6. Received fresh is a 0.6 x 0.4 x 0.3 cm tan yellow and anthracotic soft tissue fragment, submitted entirely in one cassette. 7. Received fresh is a 1.5 x 1.3 x 0.5 cm aggregate of anthracotic lymph node fragments, submitted entirely in one cassette. 8. Received fresh is a 1.5 x 0.7 x 0.5 cm anthracotic lymph node, bisected and  submitted entirely in one cassette. 3 of 4 FINAL for SHARON, RUBIS B [phone]) Gross(continued) 9. Received fresh is a 1.4 x 1 x 0.3 cm aggregate of anthracotic lymph node fragments, submitted entirely in one cassette. 10. Received fresh is a 0.7 x 0.5 x 0.4 cm anthracotic lymph node fragment, submitted entirely in one cassette. 11. Received fresh is a 1.5 x 1 x 0.7 cm aggregate of anthracotic lymph node fragments submitted entirely in one cassette. 12. Received fresh is a 0.3 x 0.2 x 0.1 cm anthracotic soft tissue fragment, submitted entirely in one cassette. 13. Received fresh is a 0.6 x 0.5 x 0.4 cm anthracotic lymph node fragment, submitted entirely in one cassette. (AK:kh 06-14-16) Stain(s) used in Diagnosis: The following stain(s) were used in diagnosing the case: Orcein Stain. The control(s) stained appropriately. Report signed out from the following location(s) Technical component and interpretation was performed at [facility], [place], [place] [address]. CLIA #: [id number], 4 of 4   Discharge Exam: Blood pressure (!) 157/77, pulse 68, temperature 98.1 F (36.7 C), temperature source Oral, resp. rate 17, height '5\' 1"'$  (1.549 m), weight 199 lb 4.7 oz (90.4 kg), SpO2 97 %.  General appearance: alert,  cooperative and no distress Neurologic: intact Heart: regular rate and rhythm Lungs: diminished breath sounds right base Wound: clean and dry    Disposition: 01-Home or Self Care     Medication List    STOP taking these medications   ibuprofen 200 MG tablet Commonly known as:  ADVIL,MOTRIN     TAKE these medications   levothyroxine 150 MCG tablet Commonly known as:  SYNTHROID, LEVOTHROID Take 1 tablet (150 mcg total) by mouth daily before breakfast. What changed:  how much to take   oxyCODONE 5 MG immediate release tablet Commonly known as:  Oxy IR/ROXICODONE Take 1-2 tablets (5-10 mg total) by mouth every 6 (six) hours as  needed for moderate pain, severe pain or breakthrough pain.   Vitamin D3 1000 units Caps Take 2 capsules (2,000 Units total) by mouth daily.      Follow-up Information    Melrose Nakayama, MD Follow up.   Specialty:  Cardiothoracic Surgery Why:  Appointment to see Dr. Roxan Hockey on 06/29/2016 at 3 PM. Please obtain a chest x-ray at 2:30 PM at [facility] imaging which is located in the same office complex. Contact information: [address] [place] [address] [phone]        [facility] Follow up.   Specialty:  Cardiothoracic Surgery Why:  Appointment with nurse on 06/23/2016 at 10:30 AM for suture removal. Contact information: [place], [place] [phone]          Signed: John Giovanni 06/18/2016, 9:27 AM

## 2016-06-16 NOTE — Care Management Note (Signed)
Case Management Note  Patient Details  Name: Monica [name] MRN: [id number] Date of Birth: 01-01-1966  Subjective/Objective:    S/p Vats with wedge resection, chest tube to water seal, has small ptx,  Questionable air leak , for ambulation today, NCM will cont to follow for dc needs.               Action/Plan:   Expected Discharge Date:                  Expected Discharge Plan:  Home/Self Care  In-House Referral:     Discharge planning Services  CM Consult  Post Acute Care Choice:    Choice offered to:     DME Arranged:    DME Agency:     HH Arranged:    HH Agency:     Status of Service:  Completed, signed off  If discussed at [employer] of Stay Meetings, dates discussed:    Additional Comments:  Zenon Mayo, RN 06/16/2016, 4:01 PM

## 2016-06-17 ENCOUNTER — Inpatient Hospital Stay (HOSPITAL_COMMUNITY): Payer: BLUE CROSS/BLUE SHIELD

## 2016-06-17 LAB — GLUCOSE, CAPILLARY
GLUCOSE-CAPILLARY: 112 mg/dL — AB (ref 65–99)
GLUCOSE-CAPILLARY: 81 mg/dL (ref 65–99)
GLUCOSE-CAPILLARY: 85 mg/dL (ref 65–99)

## 2016-06-17 MED ORDER — OXYCODONE HCL 5 MG PO TABS
5.0000 mg | ORAL_TABLET | ORAL | Status: DC | PRN
  Administered 2016-06-17: 5 mg via ORAL
  Administered 2016-06-17: 10 mg via ORAL
  Administered 2016-06-18: 5 mg via ORAL
  Filled 2016-06-17: qty 1
  Filled 2016-06-17: qty 2
  Filled 2016-06-17: qty 1

## 2016-06-17 NOTE — Progress Notes (Addendum)
      [address]       [place] [address]             [phone]      3 Days Post-Op Procedure(s) (LRB): VIDEO ASSISTED THORACOSCOPY (VATS)/WEDGE RESECTION (Right) RIGHT UPPER LOBECTOMY (Right) RIGHT UPPER LOBE LYMPH NODE DISSECTION (Right)   Subjective:  Monica [name] complains of some dizziness with standing.  Her appetite has improved some.  She is not ambulating much.  Objective: Vital signs in last 24 hours: Temp:  [97.9 F (36.6 C)-98.3 F (36.8 C)] 97.9 F (36.6 C) (11/16 0400) Pulse Rate:  [71-79] 71 (11/16 0400) Cardiac Rhythm: Normal sinus rhythm (11/16 0709) Resp:  [14-20] 14 (11/16 0400) BP: (129-149)/(69-77) 132/69 (11/16 0400) SpO2:  [97 %-99 %] 97 % (11/16 0400)  Intake/Output from previous day: 11/15 0701 - 11/16 0700 In: 320 [P.O.:120; I.V.:200] Out: 2155 [Urine:1805; Chest Tube:350]  General appearance: alert, cooperative and no distress Heart: regular rate and rhythm Lungs: clear to auscultation bilaterally Abdomen: soft, non-tender; bowel sounds normal; no masses,  no organomegaly Wound: clean and dry  Lab Results:  Recent Labs  06/15/16 0500 06/16/16 0556  WBC 12.9* 11.4*  HGB 11.8* 11.0*  HCT 34.3* 33.7*  PLT 182 218   BMET:  Recent Labs  06/15/16 0500 06/16/16 0556  NA 138 140  K 4.5 3.7  CL 109 110  CO2 22 24  GLUCOSE 135* 104*  BUN 9 10  CREATININE 0.62 0.65  CALCIUM 7.8* 8.2*    PT/INR: No results for input(s): LABPROT, INR in the last 72 hours. ABG    Component Value Date/Time   PHART 7.426 06/15/2016 0305   HCO3 23.1 06/15/2016 0305   ACIDBASEDEF 0.6 06/15/2016 0305   O2SAT 98.9 06/15/2016 0305   CBG (last 3)   Recent Labs  06/16/16 0513 06/16/16 1216 06/17/16 0110  GLUCAP 114* 93 85    Assessment/Plan: S/P Procedure(s) (LRB): VIDEO ASSISTED THORACOSCOPY (VATS)/WEDGE RESECTION (Right) RIGHT UPPER LOBECTOMY (Right) RIGHT UPPER LOBE LYMPH NODE DISSECTION (Right)  1. Chest tube-  there is air sucking around chest tube which is likely source of air leak... CXR is stable, will remove chest tube today 2. D/C PCA 3. Encouraged ambulation, oral intake 4. Dispo- patient stable, d/c chest tube today.. Needs to ambulate.. Repeat CXR in AM if remains stable home in AM   LOS: 3 days    BARRETT, ERIN 06/17/2016 Patient seen and examined, agree with above The side hole of the chest tube is close to or possibly just outside the rib cage. When holding vaseline gauze tightly at CT site there is respiratory variation but no air leak from the tube. Will dc CT Needs to ambulate DC PCA once CT out PATH- T1a, N0- stage IA. Patient informed  Monica [name]. Roxan Hockey, MD [facility] [phone]

## 2016-06-17 NOTE — Progress Notes (Signed)
Wasted 11m Fentanyl PCA with BLucie Leather RN as witness.

## 2016-06-18 ENCOUNTER — Inpatient Hospital Stay (HOSPITAL_COMMUNITY): Payer: BLUE CROSS/BLUE SHIELD

## 2016-06-18 MED ORDER — OXYCODONE HCL 5 MG PO TABS: 5.0000 mg | ORAL_TABLET | Freq: Four times a day (QID) | ORAL | 0 refills | Status: DC | PRN

## 2016-06-18 NOTE — Progress Notes (Signed)
Received order to d/c pt.  Removed CVC and PIV.  Provided pt with discharge instructions and handout.  Answered all of pts questions.

## 2016-06-18 NOTE — Progress Notes (Signed)
4 Days Post-Op Procedure(s) (LRB): VIDEO ASSISTED THORACOSCOPY (VATS)/WEDGE RESECTION (Right) RIGHT UPPER LOBECTOMY (Right) RIGHT UPPER LOBE LYMPH NODE DISSECTION (Right) Subjective Some incisional pain Ambulated around unit twice this AM  Objective: Vital signs in last 24 hours: Temp:  [97.9 F (36.6 C)-98.7 F (37.1 C)] 98.1 F (36.7 C) (11/17 0804) Pulse Rate:  [67-90] 68 (11/17 0804) Cardiac Rhythm: Normal sinus rhythm (11/17 0804) Resp:  [13-19] 17 (11/17 0804) BP: (128-157)/(72-85) 157/77 (11/17 0804) SpO2:  [97 %-100 %] 97 % (11/17 0804)  Hemodynamic parameters for last 24 hours:    Intake/Output from previous day: 11/16 0701 - 11/17 0700 In: 765 [P.O.:720; I.V.:45] Out: 0  Intake/Output this shift: Total I/O In: 120 [P.O.:120] Out: -   General appearance: alert, cooperative and no distress Neurologic: intact Heart: regular rate and rhythm Lungs: diminished breath sounds right base Wound: clean and dry  Lab Results:  Recent Labs  06/16/16 0556  WBC 11.4*  HGB 11.0*  HCT 33.7*  PLT 218   BMET:  Recent Labs  06/16/16 0556  NA 140  K 3.7  CL 110  CO2 24  GLUCOSE 104*  BUN 10  CREATININE 0.65  CALCIUM 8.2*    PT/INR: No results for input(s): LABPROT, INR in the last 72 hours. ABG    Component Value Date/Time   PHART 7.426 06/15/2016 0305   HCO3 23.1 06/15/2016 0305   ACIDBASEDEF 0.6 06/15/2016 0305   O2SAT 98.9 06/15/2016 0305   CBG (last 3)   Recent Labs  06/17/16 0110 06/17/16 1241 06/17/16 2330  GLUCAP 85 81 112*    Assessment/Plan: S/P Procedure(s) (LRB): VIDEO ASSISTED THORACOSCOPY (VATS)/WEDGE RESECTION (Right) RIGHT UPPER LOBECTOMY (Right) RIGHT UPPER LOBE LYMPH NODE DISSECTION (Right) Plan for discharge: see discharge orders  Doing well POD # 4 Dc central line Home today    LOS: 4 days    Melrose Nakayama 06/18/2016

## 2016-06-23 ENCOUNTER — Other Ambulatory Visit: Payer: Self-pay | Admitting: *Deleted

## 2016-06-23 ENCOUNTER — Encounter (INDEPENDENT_AMBULATORY_CARE_PROVIDER_SITE_OTHER): Payer: Self-pay | Admitting: *Deleted

## 2016-06-23 DIAGNOSIS — Z4802 Encounter for removal of sutures: Secondary | ICD-10-CM

## 2016-06-23 DIAGNOSIS — Z902 Acquired absence of lung [part of]: Secondary | ICD-10-CM

## 2016-06-26 ENCOUNTER — Encounter: Payer: Self-pay | Admitting: Family Medicine

## 2016-06-28 ENCOUNTER — Other Ambulatory Visit: Payer: Self-pay

## 2016-06-28 DIAGNOSIS — C3492 Malignant neoplasm of unspecified part of left bronchus or lung: Secondary | ICD-10-CM

## 2016-06-29 ENCOUNTER — Ambulatory Visit
Admission: RE | Admit: 2016-06-29 | Discharge: 2016-06-29 | Disposition: A | Discharge status: Home / Self Care | Payer: BLUE CROSS/BLUE SHIELD | Source: Ambulatory Visit | Attending: Thoracic Surgery (Cardiothoracic Vascular Surgery) | Admitting: Thoracic Surgery (Cardiothoracic Vascular Surgery)

## 2016-06-29 ENCOUNTER — Ambulatory Visit (INDEPENDENT_AMBULATORY_CARE_PROVIDER_SITE_OTHER): Payer: Self-pay | Admitting: Thoracic Surgery (Cardiothoracic Vascular Surgery)

## 2016-06-29 VITALS — BP 120/94 | HR 90 | Resp 20 | Ht 61.0 in

## 2016-06-29 DIAGNOSIS — R05 Cough: Secondary | ICD-10-CM | POA: Diagnosis not present

## 2016-06-29 DIAGNOSIS — C3491 Malignant neoplasm of unspecified part of right bronchus or lung: Secondary | ICD-10-CM

## 2016-06-29 DIAGNOSIS — Z902 Acquired absence of lung [part of]: Secondary | ICD-10-CM

## 2016-06-29 DIAGNOSIS — C3492 Malignant neoplasm of unspecified part of left bronchus or lung: Secondary | ICD-10-CM

## 2016-06-29 MED ORDER — BENZONATATE 100 MG PO CAPS: 100.0000 mg | ORAL_CAPSULE | Freq: Three times a day (TID) | ORAL | 1 refills | Status: DC | PRN

## 2016-06-29 NOTE — Progress Notes (Signed)
[name]       [place] [address]             [phone]       HPI: Monica [name] returns for follow-up after her recent right upper lobectomy.  Monica [name] is a 50 year old nonsmoker with past history of sarcoidosis, hypertension, and hypothyroidism. She recently had a chest x-ray to follow-up her sarcoidosis which showed a lung nodule. A CT confirmed an 11 mm spiculated right upper lobe nodule. PET CT was hypermetabolic with an SUV of 3.5.  I did a wedge resection and then thoracoscopic right upper lobectomy on 06/14/2016. The nodule turned out to be a stage IA well-differentiated adenocarcinoma. Her nodes were negative. She was T1a, N0. Her postoperative course was uncomplicated and she went home on postoperative day #4.  She is still having some incisional pain. She is using oxycodone for that. Her biggest complaint has a persistent cough. This tends to be worse when she lies down at night, but occurs during the day as well. She has not taken anything for that.  Past Medical History:  Diagnosis Date  . History of chicken pox   . History of hypertension   . Hypothyroidism   . Primary adenocarcinoma of upper lobe of right lung ([place]) 05/28/2016   RUL apex spiculated ~2cm nodule concerning by PET scan - referred to multidisciplinary thoracic oncology clinic S/p thoracoscopic RULobectomy Monica [name]) 06/2016  . Sarcoidosis ([place]) ~2004   treated with prendisone and stable since then     Current Outpatient Prescriptions  Medication Sig Dispense Refill  . Cholecalciferol (VITAMIN D3) 1000 units CAPS Take 2 capsules (2,000 Units total) by mouth daily. 30 capsule   . levothyroxine (SYNTHROID, LEVOTHROID) 150 MCG tablet Take 1 tablet (150 mcg total) by mouth daily before breakfast. (Patient taking differently: Take 75 mcg by mouth daily before breakfast. ) 30 tablet 6  . oxyCODONE (OXY IR/ROXICODONE) 5 MG immediate release tablet Take 1-2 tablets (5-10 mg  total) by mouth every 6 (six) hours as needed for moderate pain, severe pain or breakthrough pain. 28 tablet 0  . benzonatate (TESSALON) 100 MG capsule Take 1 capsule (100 mg total) by mouth 3 (three) times daily as needed for cough. 30 capsule 1   No current facility-administered medications for this visit.     Physical Exam BP (!) 120/94   Pulse 90   Resp 20   Ht '5\' 1"'$  (1.549 m)   SpO2 98% Comment: RA 50 year old woman in no acute distress Alert and oriented 3 with no focal deficits Lungs diminished breath sounds right base, otherwise clear Incisions healing well  Diagnostic Tests: CHEST  2 VIEW  COMPARISON:  PA and lateral chest x-ray of June 18, 2016  FINDINGS: The small right pneumothorax has resolved. There is volume loss on the right with elevation of the hemidiaphragm that is slightly more conspicuous today. There is no alveolar infiltrate. There is no mediastinal shift. The left lung is clear. The mediastinum is normal in width. The observed bony thorax is unremarkable. The gas pattern in the upper abdomen is normal.  IMPRESSION: Persistent volume loss on the right slightly more conspicuous today which may reflect pleural fluid as well as postprocedural changes. No residual pneumothorax. No CHF.  If the patient's symptoms warrant further radiographic evaluation, chest CT scanning would be the most useful next imaging step.   Electronically Signed   By: David  Martinique M.D.   On: 06/29/2016 14:50 I  personally reviewed the chest x-ray. It shows postoperative changes consistent with a right upper lobectomy.  Impression: 50 year old woman who is now about 2 weeks out from a thoracoscopic right upper lobectomy for what turned out to be a stage IA non-small cell carcinoma. Overall she is doing well at this point in time. She does still has some discomfort, which is to be expected. She is only having to take the oxycodone occasionally. She may use Tylenol or  nonsteroidal anti-inflammatories in addition to or instead of the oxycodone.  She should not try to drive yet. She should wait at least another week. She should not attempt to drive while she is taking oxycodone for pain. Other than that there are no restrictions on her activities, but she was advised to build into her activities gradually to avoid undue pain.  She had stage IA disease so she will not need adjuvant therapy, but I do want to get her into our multidisciplinary thoracic oncology clinic to see oncology as they will be involved in follow-up.  Cough- will give her a prescription for Tessalon 100 mg by mouth 3 times a day when necessary  Plan: Referral to [place] Return in 2 months with PA & Lateral chest x-ray  Melrose Nakayama, MD [facility] [phone]

## 2016-07-02 ENCOUNTER — Telehealth: Payer: Self-pay | Admitting: *Deleted

## 2016-07-02 DIAGNOSIS — R911 Solitary pulmonary nodule: Secondary | ICD-10-CM

## 2016-07-02 NOTE — Telephone Encounter (Signed)
Oncology Nurse Navigator Documentation  Oncology Nurse Navigator Flowsheets 07/02/2016  Navigator Location [facility]  Referral date to RadOnc/MedOnc 06/29/2016  Navigator Encounter Type Telephone/I received referral from Dr. Leonarda Salon office.  I called patient and scheduled her to be seen at [facility] on 07/22/16 arrive at 12:30.  She verbalized understanding of appt time and place.   Telephone Outgoing Call  Treatment Phase Other  Barriers/Navigation Needs Coordination of Care  Interventions Coordination of Care  Coordination of Care Appts  Acuity Level 1  Time Spent with Patient 15

## 2016-07-09 ENCOUNTER — Encounter: Payer: Self-pay | Admitting: Family Medicine

## 2016-07-09 ENCOUNTER — Ambulatory Visit (INDEPENDENT_AMBULATORY_CARE_PROVIDER_SITE_OTHER): Payer: BLUE CROSS/BLUE SHIELD | Admitting: Family Medicine

## 2016-07-09 VITALS — BP 124/74 | HR 95 | Temp 98.2°F | Ht 61.0 in | Wt 191.5 lb

## 2016-07-09 DIAGNOSIS — R0989 Other specified symptoms and signs involving the circulatory and respiratory systems: Secondary | ICD-10-CM

## 2016-07-09 DIAGNOSIS — F458 Other somatoform disorders: Secondary | ICD-10-CM

## 2016-07-09 DIAGNOSIS — R198 Other specified symptoms and signs involving the digestive system and abdomen: Secondary | ICD-10-CM

## 2016-07-09 MED ORDER — PANTOPRAZOLE SODIUM 40 MG PO TBEC: 40.0000 mg | DELAYED_RELEASE_TABLET | Freq: Every day | ORAL | 3 refills | Status: DC

## 2016-07-09 NOTE — Assessment & Plan Note (Signed)
This could be due to pnd or gerd or both with recent lung surgery  No constitutional symptoms and re assuring exam  Adv to continue flonase Adding generic protonix daily  Perhaps this will help throat clearing and cough as well  She has upcoming oncology f/u  Will alert [place] and them if no improvement

## 2016-07-09 NOTE — Patient Instructions (Signed)
I think your throat symptoms may be due to post nasal drip plus or minus acid reflux  Use the flonase Start protonix 40 mg once dialy  Also don't eat late  If not improving in 1-2 weeks- alert [place]  Let your oncologist know as well

## 2016-07-09 NOTE — Progress Notes (Signed)
Pre visit review using our clinic review tool, if applicable. No additional management support is needed unless otherwise documented below in the visit note. 

## 2016-07-09 NOTE — Progress Notes (Signed)
Subjective:    Patient ID: Monica [name], female    DOB: 16-Oct-1965, 50 y.o.   MRN: [id number]  HPI 50 yo obese pt of Dr Darnell Level with hx of lung cancer with wedge resection presents with c/o of throat fullness/discomfort   R side of her throat she feels a fullness (cannot palp anything)  Does not feel quite right  Recently started having post nasal drip   She started flonase yesterday   No fever or rash  No ear symptoms  a mild baseline cough  Has tessalon   Clears throat a lot   Unsure whether she may have acid reflux (they did give her something in the hospital- protonix)    Has had surgery for lung cancer this fall - wedge resection and no adj therapy  Reviewed her records for this today This went very well  Still some funny feelings at incision  No sob   Patient Active Problem List   Diagnosis Date Noted  . Globus sensation 07/09/2016  . Nodule of right lung 06/14/2016  . Primary adenocarcinoma of upper lobe of right lung ([place]) 05/28/2016  . Health maintenance examination 04/01/2015  . Vitamin D deficiency 04/01/2015  . Obesity, Class II, BMI 35-39.9, no comorbidity 04/01/2015  . History of sarcoidosis 10/10/2014  . Hypothyroidism 10/10/2014   Past Medical History:  Diagnosis Date  . History of chicken pox   . History of hypertension   . Hypothyroidism   . Primary adenocarcinoma of upper lobe of right lung ([place]) 05/28/2016   RUL apex spiculated ~2cm nodule concerning by PET scan - referred to multidisciplinary thoracic oncology clinic S/p thoracoscopic RULobectomy Roxan Hockey) 06/2016  . Sarcoidosis ([place]) ~2004   treated with prendisone and stable since then   Past Surgical History:  Procedure Laterality Date  . COLONOSCOPY  09/2015   WNL [name])  . EYE SURGERY Bilateral 1998   Lasik  . LOBECTOMY Right 06/14/2016   RU Lobectomy; Surgeon: Melrose Nakayama, MD  . LYMPH NODE DISSECTION Right 06/14/2016   RIGHT UPPER LOBE LYMPH NODE DISSECTION;   Surgeon: Melrose Nakayama, MD  . TUBAL LIGATION  2001  . VIDEO ASSISTED THORACOSCOPY (VATS)/WEDGE RESECTION Right 06/14/2016   Surgeon: Melrose Nakayama, MD   Social History  Substance Use Topics  . Smoking status: Never Smoker  . Smokeless tobacco: Never Used  . Alcohol use No   Family History  Problem Relation Age of Onset  . Cancer Mother 36    breast with met to bone  . Hypertension Mother   . Cancer Father 63    prostate  . CAD Maternal Grandmother     MI  . Stroke Maternal Grandmother   . Stroke Paternal Grandmother   . CAD Paternal Grandmother   . Diabetes Paternal Grandfather    No Known Allergies Current Outpatient Prescriptions on File Prior to Visit  Medication Sig Dispense Refill  . benzonatate (TESSALON) 100 MG capsule Take 1 capsule (100 mg total) by mouth 3 (three) times daily as needed for cough. 30 capsule 1  . Cholecalciferol (VITAMIN D3) 1000 units CAPS Take 2 capsules (2,000 Units total) by mouth daily. 30 capsule   . levothyroxine (SYNTHROID, LEVOTHROID) 150 MCG tablet Take 1 tablet (150 mcg total) by mouth daily before breakfast. (Patient taking differently: Take 75 mcg by mouth daily before breakfast. ) 30 tablet 6  . oxyCODONE (OXY IR/ROXICODONE) 5 MG immediate release tablet Take 1-2 tablets (5-10 mg total) by mouth every 6 (six)  hours as needed for moderate pain, severe pain or breakthrough pain. 28 tablet 0   No current facility-administered medications on file prior to visit.     Review of Systems Review of Systems  Constitutional: Negative for fever, appetite change,  and unexpected weight change.  Eyes: Negative for pain and visual disturbance.  ENT pos for globus sensation and frequent throat clearing , neg for st , pos for pnd , neg for sinus pain or pressure  Respiratory: Negative for wheeze  and shortness of breath.  Pos for cough Cardiovascular: Negative for cp or palpitations    Gastrointestinal: Negative for nausea, diarrhea and  constipation.  Genitourinary: Negative for urgency and frequency.  Skin: Negative for pallor or rash   Neurological: Negative for weakness, light-headedness, numbness and headaches.  Hematological: Negative for adenopathy. Does not bruise/bleed easily.  Psychiatric/Behavioral: Negative for dysphoric mood. The patient is not nervous/anxious.         Objective:   Physical Exam  Constitutional: She appears well-developed and well-nourished. No distress.  obese and well appearing   HENT:  Head: Normocephalic and atraumatic.  Right Ear: External ear normal.  Left Ear: External ear normal.  Mouth/Throat: Oropharynx is clear and moist. No oropharyngeal exudate.  Mild clear pnd  Nares are boggy  Pt clears her throat often (not hoarse)  No erythema or swelling or lesions in throat  No sinus tenderness  Eyes: Conjunctivae and EOM are normal. Pupils are equal, round, and reactive to light. Right eye exhibits no discharge. Left eye exhibits no discharge.  Neck: Normal range of motion. Neck supple. No JVD present. No tracheal deviation present. No thyromegaly present.  Cardiovascular: Normal rate, regular rhythm and normal heart sounds.   Pulmonary/Chest: Effort normal and breath sounds normal. No stridor. No respiratory distress. She has no wheezes. She has no rales. She exhibits no tenderness.  Good air exch  Abdominal: Soft. Bowel sounds are normal. She exhibits no distension and no mass. There is no tenderness. There is no rebound and no guarding.  Lymphadenopathy:    She has no cervical adenopathy.  Neurological: She is alert. No cranial nerve deficit.  Skin: Skin is warm and dry. No rash noted. No erythema. No pallor.  Psychiatric: She has a normal mood and affect.          Assessment & Plan:   Problem List Items Addressed This Visit      Other   Globus sensation    This could be due to pnd or gerd or both with recent lung surgery  No constitutional symptoms and re assuring  exam  Adv to continue flonase Adding generic protonix daily  Perhaps this will help throat clearing and cough as well  She has upcoming oncology f/u  Will alert [place] and them if no improvement

## 2016-07-16 ENCOUNTER — Ambulatory Visit: Payer: BLUE CROSS/BLUE SHIELD | Admitting: Family Medicine

## 2016-07-19 ENCOUNTER — Telehealth: Payer: Self-pay | Admitting: *Deleted

## 2016-07-19 NOTE — Telephone Encounter (Signed)
Mailed [facility] letter to pt.  

## 2016-07-22 ENCOUNTER — Telehealth: Payer: Self-pay | Admitting: Internal Medicine

## 2016-07-22 ENCOUNTER — Ambulatory Visit (HOSPITAL_BASED_OUTPATIENT_CLINIC_OR_DEPARTMENT_OTHER): Payer: BLUE CROSS/BLUE SHIELD | Admitting: Internal Medicine

## 2016-07-22 ENCOUNTER — Other Ambulatory Visit (HOSPITAL_BASED_OUTPATIENT_CLINIC_OR_DEPARTMENT_OTHER): Payer: BLUE CROSS/BLUE SHIELD

## 2016-07-22 ENCOUNTER — Encounter: Payer: Self-pay | Admitting: *Deleted

## 2016-07-22 ENCOUNTER — Ambulatory Visit: Payer: BLUE CROSS/BLUE SHIELD | Attending: Internal Medicine | Admitting: Physical Therapy

## 2016-07-22 ENCOUNTER — Encounter: Payer: Self-pay | Admitting: Internal Medicine

## 2016-07-22 VITALS — BP 146/85 | HR 72 | Temp 98.6°F | Resp 18 | Ht 61.0 in | Wt 194.6 lb

## 2016-07-22 DIAGNOSIS — M25511 Pain in right shoulder: Secondary | ICD-10-CM | POA: Diagnosis not present

## 2016-07-22 DIAGNOSIS — Z8042 Family history of malignant neoplasm of prostate: Secondary | ICD-10-CM

## 2016-07-22 DIAGNOSIS — I10 Essential (primary) hypertension: Secondary | ICD-10-CM

## 2016-07-22 DIAGNOSIS — C3411 Malignant neoplasm of upper lobe, right bronchus or lung: Secondary | ICD-10-CM | POA: Diagnosis not present

## 2016-07-22 DIAGNOSIS — Z803 Family history of malignant neoplasm of breast: Secondary | ICD-10-CM

## 2016-07-22 DIAGNOSIS — R05 Cough: Secondary | ICD-10-CM

## 2016-07-22 DIAGNOSIS — N8500 Endometrial hyperplasia, unspecified: Secondary | ICD-10-CM

## 2016-07-22 DIAGNOSIS — Z862 Personal history of diseases of the blood and blood-forming organs and certain disorders involving the immune mechanism: Secondary | ICD-10-CM

## 2016-07-22 DIAGNOSIS — R911 Solitary pulmonary nodule: Secondary | ICD-10-CM

## 2016-07-22 DIAGNOSIS — Z483 Aftercare following surgery for neoplasm: Secondary | ICD-10-CM | POA: Insufficient documentation

## 2016-07-22 DIAGNOSIS — E559 Vitamin D deficiency, unspecified: Secondary | ICD-10-CM | POA: Diagnosis not present

## 2016-07-22 LAB — COMPREHENSIVE METABOLIC PANEL
ALBUMIN: 3.6 g/dL (ref 3.5–5.0)
ALK PHOS: 72 U/L (ref 40–150)
ALT: 25 U/L (ref 0–55)
AST: 20 U/L (ref 5–34)
Anion Gap: 8 mEq/L (ref 3–11)
BILIRUBIN TOTAL: 1.84 mg/dL — AB (ref 0.20–1.20)
BUN: 10.5 mg/dL (ref 7.0–26.0)
CO2: 26 meq/L (ref 22–29)
CREATININE: 0.8 mg/dL (ref 0.6–1.1)
Calcium: 9.4 mg/dL (ref 8.4–10.4)
Chloride: 109 mEq/L (ref 98–109)
EGFR: >90 mL/min/{1.73_m2} (ref >90)
GLUCOSE: 121 mg/dL (ref 70–140)
Potassium: 4.1 mEq/L (ref 3.5–5.1)
SODIUM: 143 meq/L (ref 136–145)
TOTAL PROTEIN: 7 g/dL (ref 6.4–8.3)

## 2016-07-22 LAB — CBC WITH DIFFERENTIAL/PLATELET
BASO%: 0.5 % (ref 0.0–2.0)
Basophils Absolute: 0 10*3/uL (ref 0.0–0.1)
EOS ABS: 0.2 10*3/uL (ref 0.0–0.5)
EOS%: 3.7 % (ref 0.0–7.0)
HCT: 38.3 % (ref 34.8–46.6)
HGB: 13.2 g/dL (ref 11.6–15.9)
LYMPH%: 22 % (ref 14.0–49.7)
MCH: 28 pg (ref 25.1–34.0)
MCHC: 34.5 g/dL (ref 31.5–36.0)
MCV: 81.1 fL (ref 79.5–101.0)
MONO#: 0.4 10*3/uL (ref 0.1–0.9)
MONO%: 6.9 % (ref 0.0–14.0)
NEUT%: 66.9 % (ref 38.4–76.8)
NEUTROS ABS: 4.2 10*3/uL (ref 1.5–6.5)
PLATELETS: 216 10*3/uL (ref 145–400)
RBC: 4.72 10*6/uL (ref 3.70–5.45)
RDW: 13.2 % (ref 11.2–14.5)
WBC: 6.3 10*3/uL (ref 3.9–10.3)
lymph#: 1.4 10*3/uL (ref 0.9–3.3)

## 2016-07-22 MED ORDER — METHYLPREDNISOLONE 4 MG PO TBPK: ORAL_TABLET | ORAL | 0 refills | Status: DC

## 2016-07-22 NOTE — Progress Notes (Signed)
[place] Clinical Social Work  Clinical Social Work met with patient/familyat [place] appointment to offer support and assess for psychosocial needs.  Monica [name] shared she received surgery and is here for survivorship follow up visit.  The patient has no concerns at this time and is delighted to hear she will only be on observation moving forward.  Clinical Social Work briefly discussed Clinical Social Work role and [employer] support programs/services.  Clinical Social Work encouraged patient to call with any additional questions or concerns.   Polo Riley, MSW, LCSW, OSW-C Clinical Social Worker [facility] [phone]

## 2016-07-22 NOTE — Therapy (Signed)
[place], [place], [address] Phone: [phone]   Fax:  [phone]  Physical Therapy Evaluation  Patient Details  Name: TARIN JOHNDROW MRN: [id number] Date of Birth: 01-13-1966 Referring Provider: Dr. Curt Bears  Encounter Date: 07/22/2016      PT End of Session - 07/22/16 1511    Visit Number 1   Number of Visits 1   PT Start Time 2202   PT Stop Time 1355   PT Time Calculation (min) 20 min   Activity Tolerance Patient tolerated treatment well   Behavior During Therapy [facility] for tasks assessed/performed      Past Medical History:  Diagnosis Date  . History of chicken pox   . History of hypertension   . Hypothyroidism   . Primary adenocarcinoma of upper lobe of right lung ([place]) 05/28/2016   RUL apex spiculated ~2cm nodule concerning by PET scan - referred to multidisciplinary thoracic oncology clinic S/p thoracoscopic RULobectomy Roxan Hockey) 06/2016  . Sarcoidosis ([place]) ~2004   treated with prendisone and stable since then    Past Surgical History:  Procedure Laterality Date  . COLONOSCOPY  09/2015   WNL [name])  . EYE SURGERY Bilateral 1998   Lasik  . LOBECTOMY Right 06/14/2016   RU Lobectomy; Surgeon: Melrose Nakayama, MD  . LYMPH NODE DISSECTION Right 06/14/2016   RIGHT UPPER LOBE LYMPH NODE DISSECTION;  Surgeon: Melrose Nakayama, MD  . TUBAL LIGATION  2001  . VIDEO ASSISTED THORACOSCOPY (VATS)/WEDGE RESECTION Right 06/14/2016   Surgeon: Melrose Nakayama, MD    There were no vitals filed for this visit.       Subjective Assessment - 07/22/16 1500    Subjective Says her cough has increased recently instead of decreasing over time since the surgery.   Patient is accompained by: Family member  husband   Pertinent History Patient had been followed with scans due to h/o sarcoidosis.  CSR on 05/21/16 showed right lung nodule.  Pt.  had CT, PET, then VATS  lobectomy (06/14/16) and got a diagnosis of adenocarcinoma of 1.2 cm.  13 nodes removed were negative.  Never smoker.  Sarcoidosis was diagnosed 2003 and treated with prednisone. She will have observation for follow-up.   Patient Stated Goals get info from all lung clinic providers   Currently in Pain? Yes   Pain Score 4   3-4   Pain Location Breast  to axilla   Pain Orientation Right   Pain Descriptors / Indicators Other (Comment);Sharp  pulling inside   Pain Type Surgical pain   Aggravating Factors  worse with riding in a car or doing too much   Pain Relieving Factors sitting still, resting arm on a pillow            [facility] PT Assessment - 07/22/16 0001      Assessment   Medical Diagnosis right upper lobe adenocarcinoma, st I   Referring Provider Dr. Curt Bears   Onset Date/Surgical Date 06/14/16  VATS lobectomy   Prior Therapy none     Precautions   Precautions None     Restrictions   Weight Bearing Restrictions No     Balance Screen   Has the patient fallen in the past 6 months No   Has the patient had a decrease in activity level because of a fear of falling?  No   Is the patient reluctant to leave their home because of a fear of falling?  No  Home Environment   Living Environment Private residence   Living Arrangements Spouse/significant other   Type of [place] One level     Prior Function   Level of Independence Independent   Leisure no regular exercisehas tried to walk a little     Cognition   Overall Cognitive Status Within Functional Limits for tasks assessed     Functional Tests   Functional tests Sit to Stand     Sit to Stand   Comments 11 times in 30 seconds, below average for age, and patient had mild-moderate dyspnea following     Posture/Postural Control   Posture/Postural Control Postural limitations   Postural Limitations Forward head     ROM / Strength   AROM / PROM / Strength AROM     AROM   Overall AROM  Comments standing trunk AROM:  extension limited 25%, all other motions slightly limited     Ambulation/Gait   Ambulation/Gait Yes   Ambulation/Gait Assistance 7: Independent     Balance   Balance Assessed Yes     Dynamic Standing Balance   Dynamic Standing - Comments reaches forward 10 inches in standing, below average for age  may be limited due to surgical pain at right chest                           PT Education - 07/22/16 1511    Education provided Yes   Education Details walking, Cure article on staying active, posture, breathing   Person(s) Educated Patient;Spouse   Methods Explanation;Handout   Comprehension Verbalized understanding               Lung Clinic Goals - 07/22/16 1516      Patient will be able to verbalize understanding of the benefit of exercise to decrease fatigue.   Status Achieved     Patient will be able to verbalize the importance of posture.   Status Achieved     Patient will be able to demonstrate diaphragmatic breathing for improved lung function.   Status Achieved     Patient will be able to verbalize understanding of the role of physical therapy to prevent functional decline and who to contact if physical therapy is needed.   Status Achieved             Plan - 07/22/16 1511    Clinical Impression Statement Case was discussed in mutlidisciplinary conference prior to assessment. Pleasant 50 year-old woman looking younger than her stated age, s/p VATS lobectomy for right upper lobe adenocarcinoma.  She is expected to be observed with scans for follow-up; no other treatment needed at this time.  She has pain from surgery as well as limited ROM, reach, and sit to stand in 30 seconds is below average for age with patient becoming dyspneic.  Eval is of low complexity.   Rehab Potential Excellent   Clinical Impairments Affecting Rehab Potential none   PT Frequency One time visit   PT Treatment/Interventions  Patient/family education   PT Next Visit Plan None at this time, but she may benefit from therapy for surgical pain if it does not fully resolve spontaneously   PT Home Exercise Plan walking, breathing exercise   Consulted and Agree with Plan of Care Patient      Patient will benefit from skilled therapeutic intervention in order to improve the following deficits and impairments:  Cardiopulmonary status limiting activity, Pain, Decreased range of  motion, Decreased balance  Visit Diagnosis: Aftercare following surgery for neoplasm - Plan: PT plan of care cert/re-cert  Acute pain of right shoulder - Plan: PT plan of care cert/re-cert     Problem List Patient Active Problem List   Diagnosis Date Noted  . Globus sensation 07/09/2016  . Nodule of right lung 06/14/2016  . Primary adenocarcinoma of upper lobe of right lung ([place]) 05/28/2016  . Health maintenance examination 04/01/2015  . Vitamin D deficiency 04/01/2015  . Obesity, Class II, BMI 35-39.9, no comorbidity 04/01/2015  . History of sarcoidosis 10/10/2014  . Hypothyroidism 10/10/2014    Katha Kuehne 07/22/2016, 3:20 PM  [place], [place], [address] Phone: [phone]   Fax:  [phone]  Name: MALKIA NIPPERT MRN: [id number] Date of Birth: 21-Jul-1966  Serafina Royals, PT 07/22/16 3:20 PM

## 2016-07-22 NOTE — Telephone Encounter (Signed)
Appointments scheduled per 07/22/16 los. A copy of the appointment schedule and AVS report was given to the patient, per 07/22/16 los.

## 2016-07-22 NOTE — Progress Notes (Signed)
[place] [phone]   [phone] Multidisciplinary thoracic oncology clinic  CONSULT NOTE  REFERRING PHYSICIAN: Dr. Modesto Charon  REASON FOR CONSULTATION:  50 years old white female recently diagnosed with lung cancer.  HPI Monica [name] is a 50 y.o. female and never smoker with past medical history significant for hypothyroidism, sarcoidosis, vitamin D deficiency, and hypertension. The patient has a history of sarcoidosis since 2003. She was treated with prednisone for around 6 months at that time. She was seen by her primary care physician for routine evaluation and annual chest x-ray. Chest x-ray on 05/25/2016 showed right apical nodular density. This was followed by CT scan of the chest on 06/03/2016 and the children spiculated pleural-based nodule in the right upper lobe laterally measuring 1.1 x 1.1 cm. This was highly suspicious for malignancy. There was also a nodule in the right upper lobe posteriorly measuring 0.6 cm and a groundglass nodule in the left upper lobe laterally measuring 0.4 cm. There was also groundglass nodule in the superior segment of the right lower lobe measuring 0.46 CM. there was additional nodules in the right middle lobe 0.4 cm and left apical nodule measuring 0.6 cm and right upper lobe anterior nodule 0.4 cm. A PET scan was performed on 06/07/2016 and it showed malignant range hypermetabolism within a spiculated right upper lobe nodule highly worrisome for stage IA primary bronchogenic carcinoma. On 11/13//2017 the patient underwent right VATS, right upper lobectomy with mediastinal lymph node dissection under the care of Dr. Roxan Hockey. The final pathology ([id number]) showed well-differentiated adenocarcinoma measuring 1.2 cm. The resection margins were negative but the wedge resection of the right upper lobe showed multifocal atypical adenomatous hyperplasia. The dissected lymph nodes were negative for  malignancy. Dr. Roxan Hockey kindly referred the patient to the multidisciplinary thoracic oncology clinic today for further evaluation and recommendation regarding treatment of her condition. The patient is recovering well from her surgery. When seen today she is feeling fine except for mild chest pain with radiation to the right breast from the previous surgical scar. She also has shortness breath with exertion and dry cough. She is currently on Tessalon. She denied having any weight loss or night sweats. She has no nausea, vomiting, diarrhea or constipation. She denied having any fever or chills. She has no headache or visual changes. Family history significant for mother with breast cancer at age 77, father has prostate cancer and still alive at age 32. The patient is married and has one son. 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 She has no history of smoking, alcohol or drug abuse.  HPI  Past Medical History:  Diagnosis Date  . History of chicken pox   . History of hypertension   . Hypothyroidism   . Primary adenocarcinoma of upper lobe of right lung ([place]) 05/28/2016   RUL apex spiculated ~2cm nodule concerning by PET scan - referred to multidisciplinary thoracic oncology clinic S/p thoracoscopic RULobectomy Roxan Hockey) 06/2016  . Sarcoidosis ([place]) ~2004   treated with prendisone and stable since then    Past Surgical History:  Procedure Laterality Date  . COLONOSCOPY  09/2015   WNL [name])  . EYE SURGERY Bilateral 1998   Lasik  . LOBECTOMY Right 06/14/2016   RU Lobectomy; Surgeon: Melrose Nakayama, MD  . LYMPH NODE DISSECTION Right 06/14/2016   RIGHT UPPER LOBE LYMPH NODE DISSECTION;  Surgeon: Melrose Nakayama, MD  . TUBAL LIGATION  2001  .  VIDEO ASSISTED THORACOSCOPY (VATS)/WEDGE RESECTION Right 06/14/2016   Surgeon: Melrose Nakayama, MD    Family History  Problem Relation Age of Onset  . Cancer Mother 49     breast with met to bone  . Hypertension Mother   . Cancer Father 68    prostate  . CAD Maternal Grandmother     MI  . Stroke Maternal Grandmother   . Stroke Paternal Grandmother   . CAD Paternal Grandmother   . Diabetes Paternal Grandfather     Social History Social History  Substance Use Topics  . Smoking status: Never Smoker  . Smokeless tobacco: Never Used  . Alcohol use No    No Known Allergies  Current Outpatient Prescriptions  Medication Sig Dispense Refill  . benzonatate (TESSALON) 100 MG capsule Take 1 capsule (100 mg total) by mouth 3 (three) times daily as needed for cough. 30 capsule 1  . Cholecalciferol (VITAMIN D3) 1000 units CAPS Take 2 capsules (2,000 Units total) by mouth daily. 30 capsule   . fluticasone (FLONASE) 50 MCG/ACT nasal spray Place 1 spray into both nostrils daily.    Marland Kitchen ibuprofen (ADVIL,MOTRIN) 200 MG tablet Take 200 mg by mouth every 6 (six) hours as needed.    Marland Kitchen levothyroxine (SYNTHROID, LEVOTHROID) 150 MCG tablet Take 1 tablet (150 mcg total) by mouth daily before breakfast. (Patient taking differently: Take 75 mcg by mouth daily before breakfast. ) 30 tablet 6  . pantoprazole (PROTONIX) 40 MG tablet Take 1 tablet (40 mg total) by mouth daily. 30 tablet 3  . oxyCODONE (OXY IR/ROXICODONE) 5 MG immediate release tablet Take 1-2 tablets (5-10 mg total) by mouth every 6 (six) hours as needed for moderate pain, severe pain or breakthrough pain. (Patient not taking: Reported on 07/22/2016) 28 tablet 0   No current facility-administered medications for this visit.     Review of Systems  Constitutional: negative Eyes: negative Ears, nose, mouth, throat, and face: negative Respiratory: positive for cough, dyspnea on exertion and pleurisy/chest pain Cardiovascular: negative Gastrointestinal: negative Genitourinary:negative Integument/breast: negative Hematologic/lymphatic: negative Musculoskeletal:negative Neurological: negative Behavioral/Psych:  negative Endocrine: negative Allergic/Immunologic: negative  Physical Exam  EHU:DJSHF, healthy, no distress, well nourished and well developed SKIN: skin color, texture, turgor are normal, no rashes or significant lesions HEAD: Normocephalic, No masses, lesions, tenderness or abnormalities EYES: normal, PERRLA, Conjunctiva are pink and non-injected EARS: External ears normal, Canals clear OROPHARYNX:no exudate, no erythema and lips, buccal mucosa, and tongue normal  NECK: supple, no adenopathy, no JVD LYMPH:  no palpable lymphadenopathy, no hepatosplenomegaly BREAST:not examined LUNGS: clear to auscultation , and palpation HEART: regular rate & rhythm, no murmurs and no gallops ABDOMEN:abdomen soft, non-tender, normal bowel sounds and no masses or organomegaly BACK: Back symmetric, no curvature., No CVA tenderness EXTREMITIES:no joint deformities, effusion, or inflammation, no edema, no skin discoloration  NEURO: alert & oriented x 3 with fluent speech, no focal motor/sensory deficits  PERFORMANCE STATUS: ECOG 1  LABORATORY DATA: Lab Results  Component Value Date   WBC 6.3 07/22/2016   HGB 13.2 07/22/2016   HCT 38.3 07/22/2016   MCV 81.1 07/22/2016   PLT 216 07/22/2016      Chemistry      Component Value Date/Time   NA 143 07/22/2016 1232   K 4.1 07/22/2016 1232   CL 110 06/16/2016 0556   CO2 26 07/22/2016 1232   BUN 10.5 07/22/2016 1232   CREATININE 0.8 07/22/2016 1232      Component Value Date/Time   CALCIUM 9.4  07/22/2016 1232   ALKPHOS 72 07/22/2016 1232   AST 20 07/22/2016 1232   ALT 25 07/22/2016 1232   BILITOT 1.84 (H) 07/22/2016 1232       RADIOGRAPHIC STUDIES: Dg Chest 2 View  Result Date: 06/29/2016 CLINICAL DATA:  Status post right upper lobectomy on June 14, 2016. Persistent dry cough and right-sided chest discomfort. EXAM: CHEST  2 VIEW COMPARISON:  PA and lateral chest x-ray of June 18, 2016 FINDINGS: The small right pneumothorax has  resolved. There is volume loss on the right with elevation of the hemidiaphragm that is slightly more conspicuous today. There is no alveolar infiltrate. There is no mediastinal shift. The left lung is clear. The mediastinum is normal in width. The observed bony thorax is unremarkable. The gas pattern in the upper abdomen is normal. IMPRESSION: Persistent volume loss on the right slightly more conspicuous today which may reflect pleural fluid as well as postprocedural changes. No residual pneumothorax. No CHF. If the patient's symptoms warrant further radiographic evaluation, chest CT scanning would be the most useful next imaging step. Electronically Signed   By: David  Martinique M.D.   On: 06/29/2016 14:50    ASSESSMENT: This is a very pleasant 50 years old white female recently diagnosed with stage IA (T1a, N0, M0) non-small cell lung cancer, adenocarcinoma presented with right upper lobe pulmonary nodule is status post right upper lobectomy with lymph node dissection in November 2017. The final pathology of the resection also showed multifocal atypical adenomatous hyperplasia. In a patient with a never smoking history, this could be concerning for precancerous condition.   PLAN: I had a lengthy discussion with the patient and her husband about her current condition, stage, prognosis and treatment options. I explained to the patient that with her stage IA disease, there is no survival benefit for adjuvant systemic chemotherapy and the current standard of care is observation and close monitoring. I also explained to the patient that the presence of multifocal atypical adenomatous hyperplasia could be front runner for development of adenocarcinoma of the lung in the future. I would ask the pathology department to send her tissue block to [facility] one for molecular studies. I recommended for the patient close observation and monitoring with repeat CT scan of the chest in 6 months. For the persistent dry  cough, I recommended for the patient to continue on Tessalon and Delsym. I would also start her on Medrol Dosepak. The patient was seen during the multidisciplinary thoracic oncology clinic today by medical oncology, thoracic navigator, social worker and physical therapist. She was advised to call immediately if she has any concerning symptoms in the interval.  The patient voices understanding of current disease status and treatment options and is in agreement with the current care plan.  All questions were answered. The patient knows to call the clinic with any problems, questions or concerns. We can certainly see the patient much sooner if necessary.  Thank you so much for allowing me to participate in the care of Monica [name]. I will continue to follow up the patient with you and assist in her care.  I spent 40 minutes counseling the patient face to face. The total time spent in the appointment was 60 minutes.  Disclaimer: This note was dictated with voice recognition software. Similar sounding words can inadvertently be transcribed and may not be corrected upon review.   Celesta Funderburk K. July 22, 2016, 1:43 PM

## 2016-07-22 NOTE — Progress Notes (Signed)
Oncology Nurse Navigator Documentation  Oncology Nurse Navigator Flowsheets 07/22/2016  Navigator Location [facility]  Navigator Encounter Type Clinic/MDC/gave and explained information on lung cancer, support and resources at [facility], and next steps.    Abnormal Finding Date 06/03/2016  Confirmed Diagnosis Date 06/14/2016  Surgery Date 06/14/2016  Multidisiplinary Clinic Date 07/22/2016  Treatment Initiated Date 06/14/2016  Patient Visit Type MedOnc  Treatment Phase Pre-Tx/Tx Discussion  Barriers/Navigation Needs Education  Education Newly Diagnosed Cancer Education  Interventions Education  Education Method Verbal;Written  Acuity Level 2  Acuity Level 2 Educational needs  Time Spent with Patient 30    1 Lung Cancer Lung cancer occurs when abnormal cells in the lung grow out of control and form a mass (tumor). There are several types of lung cancer. The two most common types are: . Non-small cell. In this type of lung cancer, abnormal cells are larger and grow more slowly than those of small cell lung cancer. . Small cell. In this type of lung cancer, abnormal cells are smaller than those of non-small cell lung cancer. Small cell lung cancer gets worse faster than non-small cell lung cancer. CAUSES The leading cause of lung cancer is smoking tobacco. The second leading cause is radon exposure. RISK FACTORS . Smoking tobacco. . Exposure to secondhand tobacco smoke. . Exposure to radon gas. . Exposure to asbestos. . Exposure to arsenic in drinking water. . Air pollution. . Family or personal history of lung cancer. . Lung radiation therapy. . Being older than 68 years. SIGNS AND SYMPTOMS In the early stages, symptoms may not be present. As the cancer progresses, symptoms may include: . A lasting cough, possibly with blood. . Fatigue. . Unexplained weight loss. . Shortness of breath. . Wheezing. . Chest pain. . Loss of appetite. Symptoms of advanced lung cancer  include: . Hoarseness. . Bone or joint pain. . Weakness. . Nail problems. . Face or arm swelling. . Paralysis of the face. . Drooping eyelids. DIAGNOSIS Lung cancer can be identified with a physical exam and with tests such as: Marland Kitchen A chest X-ray. . A CT scan. . Blood tests. . A biopsy. After a diagnosis is made, you will have more tests to determine the stage of the cancer. The stages of non-small cell lung cancer are: . Stage 0, also called carcinoma in situ. At this stage, abnormal cells are found in the inner lining of your lung or lungs. . Stage I. At this stage, abnormal cells have grown into a tumor that is no larger than 5 cm across. The cancer has entered the deeper lung tissue but has not yet entered the lymph nodes or other parts of the body. . Stage II. At this stage, the tumor is 7 cm across or smaller and has entered nearby lymph nodes. Or, the tumor is 5 cm across or smaller and has invaded surrounding tissue but is not found in nearby lymph nodes. There may be more than one tumor present. . Stage III. At this stage, the tumor may be any size. There may be more than one tumor in the lungs. The cancer cells have spread to the lymph nodes and possibly to other organs. . Stage IV. At this stage, there are tumors in both lungs and the cancer has spread to other areas of the body. The stages of small cell lung cancer are: Marland Kitchen Limited. At this stage, the cancer is found only on one side of the chest. . Extensive. At this stage, the cancer is  in the lungs and in tissues on the other side of the chest. The cancer has spread to other organs or is found in the fluid between the layers of your lungs. TREATMENT Depending on the type and stage of your lung cancer, you may be treated with: Marland Kitchen Surgery. This is done to remove a tumor. . Radiation therapy. This treatment destroys cancer cells using X-rays or other types of radiation. . Chemotherapy. This treatment uses medicines to destroy cancer  cells. . Targeted therapy. This treatment aims to destroy only cancer cells instead of all cells as other therapies do. You may also have a combination of treatments. HOME CARE INSTRUCTIONS . Do not use any tobacco products. This includes cigarettes, chewing tobacco, and electronic cigarettes. If you need help quitting, ask your health care provider. . Take medicines only as directed by your health care provider. . Eat a healthy diet. Work with a dietitian to make sure you are getting the nutrition you need. . Consider joining a support group or seeking counseling to help you cope with the stress of having lung cancer. . Let your cancer specialist (oncologist) know if you are admitted to the hospital. . Keep all follow-up visits as directed by your health care provider. This is important. SEEK MEDICAL CARE IF: . You lose weight without trying. . You have a persistent cough and wheezing. . You feel short of breath. . You tire easily. . You experience bone or joint pain. . You have difficulty swallowing. . You feel hoarse or notice your voice changing. . Your pain medicine is not helping. SEEK IMMEDIATE MEDICAL CARE IF: . You cough up blood. . You have new breathing problems. . You develop chest pain. . You develop swelling in: ? One or both ankles or legs. ? Your face, neck, or arms. . You are confused. . You experience paralysis in your face or a drooping eyelid. This information is not intended to replace advice given to you by your health care provider. Make sure you discuss any questions you have with your health care provider. Document Released: 10/25/2000 Document Revised: 04/09/2015 Document Reviewed: 11/22/2013 Elsevier Interactive Patient Education 2017 [employer].

## 2016-07-23 ENCOUNTER — Other Ambulatory Visit (HOSPITAL_COMMUNITY)
Admission: RE | Admit: 2016-07-23 | Discharge: 2016-07-23 | Disposition: A | Discharge status: Home / Self Care | Payer: BLUE CROSS/BLUE SHIELD | Source: Ambulatory Visit | Attending: Internal Medicine | Admitting: Internal Medicine

## 2016-07-23 DIAGNOSIS — C3411 Malignant neoplasm of upper lobe, right bronchus or lung: Secondary | ICD-10-CM | POA: Diagnosis not present

## 2016-07-28 ENCOUNTER — Encounter: Payer: Self-pay | Admitting: *Deleted

## 2016-07-28 NOTE — Progress Notes (Signed)
Oncology Nurse Navigator Documentation  Oncology Nurse Navigator Flowsheets 07/28/2016  Navigator Location [facility]  Navigator Encounter Type Other/per Dr. Worthy Flank, I requested from [facility] de[facility] tissue obtained on 06/14/16 to be sent for molecular testing, [facility].   Treatment Phase Other  Barriers/Navigation Needs Coordination of Care  Interventions Coordination of Care  Coordination of Care Other  Acuity Level 2  Acuity Level 2 Other  Time Spent with Patient 15

## 2016-07-29 DIAGNOSIS — Z1231 Encounter for screening mammogram for malignant neoplasm of breast: Secondary | ICD-10-CM | POA: Diagnosis not present

## 2016-07-29 DIAGNOSIS — Z01419 Encounter for gynecological examination (general) (routine) without abnormal findings: Secondary | ICD-10-CM | POA: Diagnosis not present

## 2016-07-29 DIAGNOSIS — Z6835 Body mass index (BMI) 35.0-35.9, adult: Secondary | ICD-10-CM | POA: Diagnosis not present

## 2016-08-03 DIAGNOSIS — K080 Exfoliation of teeth due to systemic causes: Secondary | ICD-10-CM | POA: Diagnosis not present

## 2016-08-17 ENCOUNTER — Ambulatory Visit (INDEPENDENT_AMBULATORY_CARE_PROVIDER_SITE_OTHER): Payer: BLUE CROSS/BLUE SHIELD | Admitting: Family Medicine

## 2016-08-17 ENCOUNTER — Encounter: Payer: Self-pay | Admitting: Family Medicine

## 2016-08-17 VITALS — BP 120/80 | HR 81 | Temp 98.5°F | Ht 61.0 in | Wt 198.2 lb

## 2016-08-17 DIAGNOSIS — F458 Other somatoform disorders: Secondary | ICD-10-CM | POA: Diagnosis not present

## 2016-08-17 DIAGNOSIS — H6981 Other specified disorders of Eustachian tube, right ear: Secondary | ICD-10-CM

## 2016-08-17 DIAGNOSIS — J3489 Other specified disorders of nose and nasal sinuses: Secondary | ICD-10-CM | POA: Insufficient documentation

## 2016-08-17 DIAGNOSIS — R05 Cough: Secondary | ICD-10-CM | POA: Diagnosis not present

## 2016-08-17 DIAGNOSIS — R198 Other specified symptoms and signs involving the digestive system and abdomen: Secondary | ICD-10-CM

## 2016-08-17 DIAGNOSIS — R0989 Other specified symptoms and signs involving the circulatory and respiratory systems: Secondary | ICD-10-CM

## 2016-08-17 DIAGNOSIS — R053 Chronic cough: Secondary | ICD-10-CM | POA: Insufficient documentation

## 2016-08-17 MED ORDER — PREDNISONE 20 MG PO TABS: ORAL_TABLET | ORAL | 0 refills | Status: DC

## 2016-08-17 MED ORDER — ALBUTEROL SULFATE HFA 108 (90 BASE) MCG/ACT IN AERS: 2.0000 | INHALATION_SPRAY | Freq: Four times a day (QID) | RESPIRATORY_TRACT | 2 refills | Status: DC | PRN

## 2016-08-17 NOTE — Patient Instructions (Signed)
Complete course of prednisone.  Can use albuterol as needed for coughing fits.   Stop Flonase for now, continue Claritin.  If not improving, call for consideration of referral to ENT.

## 2016-08-17 NOTE — Progress Notes (Signed)
Subjective:    Patient ID: Monica [name], female    DOB: 1966/02/26, 51 y.o.   MRN: [id number]  EVER SINCE LOBECTOMY FOR LUNG CANCER SHE HAS HAD COUGH. 06/14/2017  Was intubated.     Sinusitis  This is a new problem. The current episode started in the past 7 days. The problem has been gradually worsening since onset. There has been no fever. Associated symptoms include coughing, ear pain, shortness of breath and sinus pressure. Pertinent negatives include no headaches or sore throat. (Sinus pressure started in last 24-48 hours, achy in right ear, and in anterior neck. Pressure most in right face. Frequently with sensation of something in throat.  Cough is dry.) Treatments tried:  flonase 2 sprays per nostril, claritin and mucinex D x 2 weeks. The treatment provided mild relief.  Cough  Associated symptoms include ear pain and shortness of breath. Pertinent negatives include no headaches, sore throat or wheezing.   Tessalon perles did not help cough after surgery. Protonix did not help cough at all.  Years ago issue with asthma.  Review of Systems  HENT: Positive for ear pain and sinus pressure. Negative for sore throat.   Respiratory: Positive for cough and shortness of breath. Negative for wheezing.   Neurological: Negative for headaches.       Objective:   Physical Exam  Constitutional: Vital signs are normal. She appears well-developed and well-nourished. She is cooperative.  Non-toxic appearance. She does not appear ill. No distress.  HENT:  Head: Normocephalic.  Right Ear: Hearing, external ear and ear canal normal. Tympanic membrane is not erythematous, not retracted and not bulging. A middle ear effusion is present.  Left Ear: Hearing, tympanic membrane, external ear and ear canal normal. Tympanic membrane is not erythematous, not retracted and not bulging.  No middle ear effusion.  Nose: No mucosal edema or rhinorrhea. Right sinus exhibits maxillary sinus tenderness.  Right sinus exhibits no frontal sinus tenderness. Left sinus exhibits no maxillary sinus tenderness and no frontal sinus tenderness.  Mouth/Throat: Uvula is midline, oropharynx is clear and moist and mucous membranes are normal.  Eyes: Conjunctivae, EOM and lids are normal. Pupils are equal, round, and reactive to light. Lids are everted and swept, no foreign bodies found.  Neck: Trachea normal and normal range of motion. Neck supple. Carotid bruit is not present. No thyroid mass and no thyromegaly present.  Cardiovascular: Normal rate, regular rhythm, S1 normal, S2 normal, normal heart sounds, intact distal pulses and normal pulses.  Exam reveals no gallop and no friction rub.   No murmur heard. Pulmonary/Chest: Effort normal and breath sounds normal. No tachypnea. No respiratory distress. She has no decreased breath sounds. She has no wheezes. She has no rhonchi. She has no rales.  Abdominal: Soft. Normal appearance and bowel sounds are normal. There is no tenderness.  Neurological: She is alert.  Skin: Skin is warm, dry and intact. No rash noted.  Psychiatric: Her speech is normal and behavior is normal. Judgment and thought content normal. Her mood appears not anxious. Cognition and memory are normal. She does not exhibit a depressed mood.          Assessment & Plan:   Chronic cough: post surgery and globus sensation.Marland Kitchen  Possible post inflammatory bronchospasm.  No improvement with allergy treatment, GERD treatment.  Trial of pred taper and albuterol prn .  If not improving with this consider referral to ENT for visualization.    Sinus pressure and ETD on right: no  clear bacterial infection.  Treat with oral steroids.

## 2016-08-17 NOTE — Progress Notes (Signed)
Pre visit review using our clinic review tool, if applicable. No additional management support is needed unless otherwise documented below in the visit note. 

## 2016-08-18 ENCOUNTER — Encounter (HOSPITAL_COMMUNITY): Payer: Self-pay

## 2016-09-06 ENCOUNTER — Other Ambulatory Visit: Payer: Self-pay | Admitting: Thoracic Surgery (Cardiothoracic Vascular Surgery)

## 2016-09-06 DIAGNOSIS — C3490 Malignant neoplasm of unspecified part of unspecified bronchus or lung: Secondary | ICD-10-CM

## 2016-09-07 ENCOUNTER — Ambulatory Visit: Payer: Self-pay | Admitting: Thoracic Surgery (Cardiothoracic Vascular Surgery)

## 2016-09-07 ENCOUNTER — Ambulatory Visit: Payer: BLUE CROSS/BLUE SHIELD | Admitting: Thoracic Surgery (Cardiothoracic Vascular Surgery)

## 2016-09-07 ENCOUNTER — Ambulatory Visit
Admission: RE | Admit: 2016-09-07 | Discharge: 2016-09-07 | Disposition: A | Discharge status: Home / Self Care | Payer: BLUE CROSS/BLUE SHIELD | Source: Ambulatory Visit | Attending: Thoracic Surgery (Cardiothoracic Vascular Surgery) | Admitting: Thoracic Surgery (Cardiothoracic Vascular Surgery)

## 2016-09-07 ENCOUNTER — Other Ambulatory Visit: Payer: Self-pay | Admitting: Thoracic Surgery (Cardiothoracic Vascular Surgery)

## 2016-09-07 DIAGNOSIS — C3490 Malignant neoplasm of unspecified part of unspecified bronchus or lung: Secondary | ICD-10-CM

## 2016-09-07 NOTE — Progress Notes (Unsigned)
This encounter was created in error - please disregard.

## 2016-09-21 ENCOUNTER — Ambulatory Visit (INDEPENDENT_AMBULATORY_CARE_PROVIDER_SITE_OTHER): Payer: BLUE CROSS/BLUE SHIELD | Admitting: Thoracic Surgery (Cardiothoracic Vascular Surgery)

## 2016-09-21 ENCOUNTER — Ambulatory Visit
Admission: RE | Admit: 2016-09-21 | Discharge: 2016-09-21 | Disposition: A | Discharge status: Home / Self Care | Payer: BLUE CROSS/BLUE SHIELD | Source: Ambulatory Visit | Attending: Thoracic Surgery (Cardiothoracic Vascular Surgery) | Admitting: Thoracic Surgery (Cardiothoracic Vascular Surgery)

## 2016-09-21 ENCOUNTER — Encounter: Payer: Self-pay | Admitting: Thoracic Surgery (Cardiothoracic Vascular Surgery)

## 2016-09-21 VITALS — BP 150/90 | HR 90 | Resp 20 | Ht 61.0 in | Wt 195.0 lb

## 2016-09-21 DIAGNOSIS — C3490 Malignant neoplasm of unspecified part of unspecified bronchus or lung: Secondary | ICD-10-CM

## 2016-09-21 DIAGNOSIS — C3491 Malignant neoplasm of unspecified part of right bronchus or lung: Secondary | ICD-10-CM

## 2016-09-21 DIAGNOSIS — Z902 Acquired absence of lung [part of]: Secondary | ICD-10-CM

## 2016-09-21 DIAGNOSIS — J9811 Atelectasis: Secondary | ICD-10-CM | POA: Diagnosis not present

## 2016-09-21 DIAGNOSIS — Z862 Personal history of diseases of the blood and blood-forming organs and certain disorders involving the immune mechanism: Secondary | ICD-10-CM

## 2016-09-21 NOTE — Progress Notes (Signed)
[address]       [place] [address]             [phone]       HPI: Monica [name] returns for a scheduled 3 month follow-up visit.  Monica [name] is a 51 year old woman who had a thoracoscopic right upper lobectomy for stage IA adenocarcinoma on 06/14/2016. She is a nonsmoker but does have a past history of sarcoidosis. She had an uncomplicated postoperative course went home on day 4.   I last saw her in the office on 06/29/2016. At that time she was doing well but did have an aggravating cough. I gave her prescription for Tessalon. She saw Dr. Julien Nordmann. He did not recommend any adjuvant chemotherapy. She was still having a cough and he gave her prescription for prednisone. She had a second run of prednisone and also was on Protonix for a while. Both of those seem to help, but when she stopped them her cough returned. It is not as severe as it was previously but does still bother her. She has minimal discomfort from her incision with the exception that she still cannot wear regular bra.  Past Medical History:  Diagnosis Date  . History of chicken pox   . History of hypertension   . Hypothyroidism   . Primary adenocarcinoma of upper lobe of right lung ([facility]) 05/28/2016   RUL apex spiculated ~2cm nodule concerning by PET scan - referred to multidisciplinary thoracic oncology clinic S/p thoracoscopic RULobectomy Roxan Hockey) 06/2016  . Sarcoidosis ([place]) ~2004   treated with prendisone and stable since then     Current Outpatient Prescriptions  Medication Sig Dispense Refill  . albuterol (PROVENTIL HFA;VENTOLIN HFA) 108 (90 Base) MCG/ACT inhaler Inhale 2 puffs into the lungs every 6 (six) hours as needed for wheezing or shortness of breath. 1 Inhaler 2  . Cholecalciferol (VITAMIN D3) 1000 units CAPS Take 2 capsules (2,000 Units total) by mouth daily. 30 capsule   . fluticasone (FLONASE) 50 MCG/ACT nasal spray Place 1 spray into both nostrils daily.    Marland Kitchen  ibuprofen (ADVIL,MOTRIN) 200 MG tablet Take 200 mg by mouth every 6 (six) hours as needed.    Marland Kitchen levothyroxine (SYNTHROID, LEVOTHROID) 150 MCG tablet Take 1 tablet (150 mcg total) by mouth daily before breakfast. (Patient taking differently: Take 75 mcg by mouth daily before breakfast. ) 30 tablet 6   No current facility-administered medications for this visit.     Physical Exam BP (!) 150/90   Pulse 90   Resp 20   Ht '5\' 1"'$  (1.549 m)   Wt 195 lb (88.5 kg)   SpO2 99% Comment: RA  BMI 36.38 kg/m  51 year old woman in no acute distress Alert and oriented 3 Lungs diminished at right base, otherwise clear Cardiac regular rate and rhythm normal S1 and S2 Incisions well healed  Diagnostic Tests: CHEST  2 VIEW  COMPARISON:  06/29/2016 .  FINDINGS: Mediastinum and hilar structures normal. Postsurgical changes right lung with changes consistent with axis and or scarring in the right upper lung. No pleural effusion or pneumothorax .  IMPRESSION: Stable postsurgical changes right chest changes consistent with atelectasis and/or scarring in the right upper lung. No acute infiltrate.   Electronically Signed   By: Marcello Moores  Register   On: 09/21/2016 10:28 I personally reviewed the chest x-ray. Findings consistent with previous right upper lobectomy.  Impression: 51 year old woman who is now about 3 months out from a thoracoscopic right upper  lobectomy for stage IA adenocarcinoma. Overall she is doing very well with her recovery.   She has minimal discomfort.   Her primary problem continues to be coughing. That's better than it was, but has not resolved completely. She's had a couple of prednisone tapers which did help temporarily. She also had some relief at one point from protonix but stopped taking that. I suspect that the cough is postoperative in nature but she does describe sensations which are suspicious for reflux. I recommended that she start taking protonix again. She  could have a degree of recurrent nerve/vocal cord dysfunction. She was having some bloody sensations when she would swallow with her head turned to the right. That has pretty much resolved. I suspect that her cough will eventually resolve but I think the next step would be to have ENT look at her cords if it persists.  Dr. Julien Nordmann is planning to do a CT at 6 months.  Plan: Resume protonix (may use over-the-counter Prilosec if she prefers)  Return in 9 months for one year follow-up.  Melrose Nakayama, MD [facility] [phone]

## 2016-11-07 ENCOUNTER — Other Ambulatory Visit: Payer: Self-pay | Admitting: Family Medicine

## 2016-11-07 DIAGNOSIS — E039 Hypothyroidism, unspecified: Secondary | ICD-10-CM

## 2016-11-07 DIAGNOSIS — E559 Vitamin D deficiency, unspecified: Secondary | ICD-10-CM

## 2016-11-07 DIAGNOSIS — E785 Hyperlipidemia, unspecified: Secondary | ICD-10-CM

## 2016-11-12 ENCOUNTER — Other Ambulatory Visit (INDEPENDENT_AMBULATORY_CARE_PROVIDER_SITE_OTHER): Payer: BLUE CROSS/BLUE SHIELD

## 2016-11-12 ENCOUNTER — Encounter (INDEPENDENT_AMBULATORY_CARE_PROVIDER_SITE_OTHER): Payer: Self-pay

## 2016-11-12 DIAGNOSIS — E559 Vitamin D deficiency, unspecified: Secondary | ICD-10-CM | POA: Diagnosis not present

## 2016-11-12 DIAGNOSIS — E039 Hypothyroidism, unspecified: Secondary | ICD-10-CM

## 2016-11-12 DIAGNOSIS — E785 Hyperlipidemia, unspecified: Secondary | ICD-10-CM

## 2016-11-12 LAB — LIPID PANEL
CHOL/HDL RATIO: 4
CHOLESTEROL: 223 mg/dL — AB (ref 0–200)
HDL: 54.5 mg/dL (ref >39.00)
LDL Cholesterol: 155 mg/dL — ABNORMAL HIGH (ref 0–99)
NONHDL: 168.75
Triglycerides: 67 mg/dL (ref 0.0–149.0)
VLDL: 13.4 mg/dL (ref 0.0–40.0)

## 2016-11-12 LAB — BASIC METABOLIC PANEL
BUN: 13 mg/dL (ref 6–23)
CALCIUM: 9.2 mg/dL (ref 8.4–10.5)
CO2: 28 mEq/L (ref 19–32)
CREATININE: 0.69 mg/dL (ref 0.40–1.20)
Chloride: 105 mEq/L (ref 96–112)
GFR: 94.88 mL/min (ref >60.00)
Glucose, Bld: 113 mg/dL — ABNORMAL HIGH (ref 70–99)
Potassium: 4.4 mEq/L (ref 3.5–5.1)
Sodium: 138 mEq/L (ref 135–145)

## 2016-11-12 LAB — TSH: TSH: 1.98 u[IU]/mL (ref 0.35–4.50)

## 2016-11-12 LAB — VITAMIN D 25 HYDROXY (VIT D DEFICIENCY, FRACTURES): VITD: 28.76 ng/mL — ABNORMAL LOW (ref 30.00–100.00)

## 2016-11-12 LAB — T4, FREE: Free T4: 1.19 ng/dL (ref 0.60–1.60)

## 2016-11-19 ENCOUNTER — Encounter: Payer: Self-pay | Admitting: Family Medicine

## 2016-11-19 ENCOUNTER — Encounter (INDEPENDENT_AMBULATORY_CARE_PROVIDER_SITE_OTHER): Payer: Self-pay

## 2016-11-19 ENCOUNTER — Ambulatory Visit (INDEPENDENT_AMBULATORY_CARE_PROVIDER_SITE_OTHER): Payer: BLUE CROSS/BLUE SHIELD | Admitting: Family Medicine

## 2016-11-19 VITALS — BP 132/74 | HR 77 | Temp 98.3°F | Wt 197.5 lb

## 2016-11-19 DIAGNOSIS — E7800 Pure hypercholesterolemia, unspecified: Secondary | ICD-10-CM

## 2016-11-19 DIAGNOSIS — Z23 Encounter for immunization: Secondary | ICD-10-CM

## 2016-11-19 DIAGNOSIS — F458 Other somatoform disorders: Secondary | ICD-10-CM

## 2016-11-19 DIAGNOSIS — E559 Vitamin D deficiency, unspecified: Secondary | ICD-10-CM

## 2016-11-19 DIAGNOSIS — R198 Other specified symptoms and signs involving the digestive system and abdomen: Secondary | ICD-10-CM

## 2016-11-19 DIAGNOSIS — E039 Hypothyroidism, unspecified: Secondary | ICD-10-CM | POA: Diagnosis not present

## 2016-11-19 DIAGNOSIS — E785 Hyperlipidemia, unspecified: Secondary | ICD-10-CM | POA: Insufficient documentation

## 2016-11-19 DIAGNOSIS — C3411 Malignant neoplasm of upper lobe, right bronchus or lung: Secondary | ICD-10-CM

## 2016-11-19 DIAGNOSIS — M542 Cervicalgia: Secondary | ICD-10-CM | POA: Diagnosis not present

## 2016-11-19 DIAGNOSIS — R053 Chronic cough: Secondary | ICD-10-CM

## 2016-11-19 DIAGNOSIS — Z0000 Encounter for general adult medical examination without abnormal findings: Secondary | ICD-10-CM | POA: Diagnosis not present

## 2016-11-19 DIAGNOSIS — R05 Cough: Secondary | ICD-10-CM

## 2016-11-19 DIAGNOSIS — B001 Herpesviral vesicular dermatitis: Secondary | ICD-10-CM | POA: Insufficient documentation

## 2016-11-19 DIAGNOSIS — E669 Obesity, unspecified: Secondary | ICD-10-CM

## 2016-11-19 DIAGNOSIS — R0989 Other specified symptoms and signs involving the circulatory and respiratory systems: Secondary | ICD-10-CM

## 2016-11-19 MED ORDER — VALACYCLOVIR HCL 1 G PO TABS: 2000.0000 mg | ORAL_TABLET | Freq: Two times a day (BID) | ORAL | 3 refills | Status: DC

## 2016-11-19 MED ORDER — PANTOPRAZOLE SODIUM 40 MG PO TBEC: 40.0000 mg | DELAYED_RELEASE_TABLET | Freq: Every day | ORAL | 3 refills | Status: DC

## 2016-11-19 MED ORDER — LEVOTHYROXINE SODIUM 150 MCG PO TABS: 150.0000 ug | ORAL_TABLET | Freq: Every day | ORAL | 11 refills | Status: DC

## 2016-11-19 NOTE — Assessment & Plan Note (Signed)
s/p thoracoscopic RULobectomy and lymph node dissection (negative), did not need adjuvant chemotherapy. Appreciate care of Dr Roxan Hockey and [name].

## 2016-11-19 NOTE — Assessment & Plan Note (Signed)
Discussed healthy diet and lifestyle changes to affect sustainable weight loss  

## 2016-11-19 NOTE — Assessment & Plan Note (Signed)
Post-op cough vs GERD vs allergic rhinitis. rec restart flonase, PPI, update [place] with effect.

## 2016-11-19 NOTE — Patient Instructions (Addendum)
Tdap today. Valtrex sent to pharmacy.  Continue protonix and flonase We will set you up for thyroid ultrasound to evaluate R tenderness.  You are doing wonderfully today! Work on avoiding added sugars in the diet.  Work on healthy diet to improve sugar and cholesterol.   Health Maintenance, Female Adopting a healthy lifestyle and getting preventive care can go a long way to promote health and wellness. Talk with your health care provider about what schedule of regular examinations is right for you. This is a good chance for you to check in with your provider about disease prevention and staying healthy. In between checkups, there are plenty of things you can do on your own. Experts have done a lot of research about which lifestyle changes and preventive measures are most likely to keep you healthy. Ask your health care provider for more information. Weight and diet Eat a healthy diet  Be sure to include plenty of vegetables, fruits, low-fat dairy products, and lean protein.  Do not eat a lot of foods high in solid fats, added sugars, or salt.  Get regular exercise. This is one of the most important things you can do for your health.  Most adults should exercise for at least 150 minutes each week. The exercise should increase your heart rate and make you sweat (moderate-intensity exercise).  Most adults should also do strengthening exercises at least twice a week. This is in addition to the moderate-intensity exercise. Maintain a healthy weight  Body mass index (BMI) is a measurement that can be used to identify possible weight problems. It estimates body fat based on height and weight. Your health care provider can help determine your BMI and help you achieve or maintain a healthy weight.  For females 82 years of age and older:  A BMI below 18.5 is considered underweight.  A BMI of 18.5 to 24.9 is normal.  A BMI of 25 to 29.9 is considered overweight.  A BMI of 30 and above is  considered obese. Watch levels of cholesterol and blood lipids  You should start having your blood tested for lipids and cholesterol at 52 years of age, then have this test every 5 years.  You may need to have your cholesterol levels checked more often if:  Your lipid or cholesterol levels are high.  You are older than 52 years of age.  You are at high risk for heart disease. Cancer screening Lung Cancer  Lung cancer screening is recommended for adults 50-79 years old who are at high risk for lung cancer because of a history of smoking.  A yearly low-dose CT scan of the lungs is recommended for people who:  Currently smoke.  Have quit within the past 15 years.  Have at least a 30-pack-year history of smoking. A pack year is smoking an average of one pack of cigarettes a day for 1 year.  Yearly screening should continue until it has been 15 years since you quit.  Yearly screening should stop if you develop a health problem that would prevent you from having lung cancer treatment. Breast Cancer  Practice breast self-awareness. This means understanding how your breasts normally appear and feel.  It also means doing regular breast self-exams. Let your health care provider know about any changes, no matter how small.  If you are in your 20s or 30s, you should have a clinical breast exam (CBE) by a health care provider every 1-3 years as part of a regular health exam.  If  you are 40 or older, have a CBE every year. Also consider having a breast X-ray (mammogram) every year.  If you have a family history of breast cancer, talk to your health care provider about genetic screening.  If you are at high risk for breast cancer, talk to your health care provider about having an MRI and a mammogram every year.  Breast cancer gene (BRCA) assessment is recommended for women who have family members with BRCA-related cancers. BRCA-related cancers  include:  Breast.  Ovarian.  Tubal.  Peritoneal cancers.  Results of the assessment will determine the need for genetic counseling and BRCA1 and BRCA2 testing. Cervical Cancer  Your health care provider may recommend that you be screened regularly for cancer of the pelvic organs (ovaries, uterus, and vagina). This screening involves a pelvic examination, including checking for microscopic changes to the surface of your cervix (Pap test). You may be encouraged to have this screening done every 3 years, beginning at age 57.  For women ages 7-65, health care providers may recommend pelvic exams and Pap testing every 3 years, or they may recommend the Pap and pelvic exam, combined with testing for human papilloma virus (HPV), every 5 years. Some types of HPV increase your risk of cervical cancer. Testing for HPV may also be done on women of any age with unclear Pap test results.  Other health care providers may not recommend any screening for nonpregnant women who are considered low risk for pelvic cancer and who do not have symptoms. Ask your health care provider if a screening pelvic exam is right for you.  If you have had past treatment for cervical cancer or a condition that could lead to cancer, you need Pap tests and screening for cancer for at least 20 years after your treatment. If Pap tests have been discontinued, your risk factors (such as having a new sexual partner) need to be reassessed to determine if screening should resume. Some women have medical problems that increase the chance of getting cervical cancer. In these cases, your health care provider may recommend more frequent screening and Pap tests. Colorectal Cancer  This type of cancer can be detected and often prevented.  Routine colorectal cancer screening usually begins at 53 years of age and continues through 52 years of age.  Your health care provider may recommend screening at an earlier age if you have risk factors  for colon cancer.  Your health care provider may also recommend using home test kits to check for hidden blood in the stool.  A small camera at the end of a tube can be used to examine your colon directly (sigmoidoscopy or colonoscopy). This is done to check for the earliest forms of colorectal cancer.  Routine screening usually begins at age 13.  Direct examination of the colon should be repeated every 5-10 years through 52 years of age. However, you may need to be screened more often if early forms of precancerous polyps or small growths are found. Skin Cancer  Check your skin from head to toe regularly.  Tell your health care provider about any new moles or changes in moles, especially if there is a change in a mole's shape or color.  Also tell your health care provider if you have a mole that is larger than the size of a pencil eraser.  Always use sunscreen. Apply sunscreen liberally and repeatedly throughout the day.  Protect yourself by wearing long sleeves, pants, a wide-brimmed hat, and sunglasses whenever you  are outside. Heart disease, diabetes, and high blood pressure  High blood pressure causes heart disease and increases the risk of stroke. High blood pressure is more likely to develop in:  People who have blood pressure in the high end of the normal range (130-139/85-89 mm Hg).  People who are overweight or obese.  People who are African American.  If you are 62-43 years of age, have your blood pressure checked every 3-5 years. If you are 59 years of age or older, have your blood pressure checked every year. You should have your blood pressure measured twice-once when you are at a hospital or clinic, and once when you are not at a hospital or clinic. Record the average of the two measurements. To check your blood pressure when you are not at a hospital or clinic, you can use:  An automated blood pressure machine at a pharmacy.  A home blood pressure monitor.  If you  are between 87 years and 42 years old, ask your health care provider if you should take aspirin to prevent strokes.  Have regular diabetes screenings. This involves taking a blood sample to check your fasting blood sugar level.  If you are at a normal weight and have a low risk for diabetes, have this test once every three years after 52 years of age.  If you are overweight and have a high risk for diabetes, consider being tested at a younger age or more often. Preventing infection Hepatitis B  If you have a higher risk for hepatitis B, you should be screened for this virus. You are considered at high risk for hepatitis B if:  You were born in a country where hepatitis B is common. Ask your health care provider which countries are considered high risk.  Your parents were born in a high-risk country, and you have not been immunized against hepatitis B (hepatitis B vaccine).  You have HIV or AIDS.  You use needles to inject street drugs.  You live with someone who has hepatitis B.  You have had sex with someone who has hepatitis B.  You get hemodialysis treatment.  You take certain medicines for conditions, including cancer, organ transplantation, and autoimmune conditions. Hepatitis C  Blood testing is recommended for:  Everyone born from [birth year] through 1966.  Anyone with known risk factors for hepatitis C. Sexually transmitted infections (STIs)  You should be screened for sexually transmitted infections (STIs) including gonorrhea and chlamydia if:  You are sexually active and are younger than 53 years of age.  You are older than 52 years of age and your health care provider tells you that you are at risk for this type of infection.  Your sexual activity has changed since you were last screened and you are at an increased risk for chlamydia or gonorrhea. Ask your health care provider if you are at risk.  If you do not have HIV, but are at risk, it may be recommended that you  take a prescription medicine daily to prevent HIV infection. This is called pre-exposure prophylaxis (PrEP). You are considered at risk if:  You are sexually active and do not regularly use condoms or know the HIV status of your partner(s).  You take drugs by injection.  You are sexually active with a partner who has HIV. Talk with your health care provider about whether you are at high risk of being infected with HIV. If you choose to begin PrEP, you should first be tested for HIV. You  should then be tested every 3 months for as long as you are taking PrEP. Pregnancy  If you are premenopausal and you may become pregnant, ask your health care provider about preconception counseling.  If you may become pregnant, take 400 to 800 micrograms (mcg) of folic acid every day.  If you want to prevent pregnancy, talk to your health care provider about birth control (contraception). Osteoporosis and menopause  Osteoporosis is a disease in which the bones lose minerals and strength with aging. This can result in serious bone fractures. Your risk for osteoporosis can be identified using a bone density scan.  If you are 58 years of age or older, or if you are at risk for osteoporosis and fractures, ask your health care provider if you should be screened.  Ask your health care provider whether you should take a calcium or vitamin D supplement to lower your risk for osteoporosis.  Menopause may have certain physical symptoms and risks.  Hormone replacement therapy may reduce some of these symptoms and risks. Talk to your health care provider about whether hormone replacement therapy is right for you. Follow these instructions at home:  Schedule regular health, dental, and eye exams.  Stay current with your immunizations.  Do not use any tobacco products including cigarettes, chewing tobacco, or electronic cigarettes.  If you are pregnant, do not drink alcohol.  If you are breastfeeding, limit  how much and how often you drink alcohol.  Limit alcohol intake to no more than 1 drink per day for nonpregnant women. One drink equals 12 ounces of beer, 5 ounces of wine, or 1 ounces of hard liquor.  Do not use street drugs.  Do not share needles.  Ask your health care provider for help if you need support or information about quitting drugs.  Tell your health care provider if you often feel depressed.  Tell your health care provider if you have ever been abused or do not feel safe at home. This information is not intended to replace advice given to you by your health care provider. Make sure you discuss any questions you have with your health care provider. Document Released: 02/01/2011 Document Revised: 12/25/2015 Document Reviewed: 04/22/2015 Elsevier Interactive Patient Education  2017 [employer].

## 2016-11-19 NOTE — Progress Notes (Signed)
BP 132/74   Pulse 77   Temp 98.3 F (36.8 C) (Oral)   Wt 197 lb 8 oz (89.6 kg)   SpO2 99%   BMI 37.32 kg/m    CC: CPE Subjective:    Patient ID: Monica [name], female    DOB: 12-09-1965, 51 y.o.   MRN: [id number]  HPI: Monica [name] is a 51 y.o. female presenting on 11/19/2016 for Annual Exam   Stage IA lung adenocarcinoma s/p thoracoscopic RU lobectomy 06/2016 - incidentally found on CXR for sarcoidosis. Adjuvant chemo was not recommended by onc. Post-op cough - considering ENT referral if persistent. Ongoing globus sensation on R side of throat. This aggravates cough and some gagging. Mild dyspnea with exertion since surgery. Has tried protonix and prednisone with some improvement. ?GERD vs allergic contribution.   h/o fever blisters - previously well controlled with valtrex. Requests Rx.   Preventative: Colonoscopy 09/2015 WNL [name]) Well woman yearly 07/2016 ([place]) [place] yearly with OBGYN - normal 07/2016 LMP 2010 s/p endometrial ablation, h/o polyps BRCA gene negative Flu shot at work Tdap today Shingles - discussed, will defer to next year.  Seat belt use discussed Sunscreen use discussed. No changing moles on skin. 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 Interim medical history since our last visit reviewed. Allergies and medications reviewed and updated. Outpatient Medications Prior to Visit  Medication Sig Dispense Refill  . albuterol (PROVENTIL HFA;VENTOLIN HFA) 108 (90 Base) MCG/ACT inhaler Inhale 2 puffs into the lungs every 6 (six) hours as needed for wheezing or shortness of breath. 1 Inhaler 2  . Cholecalciferol (VITAMIN D3) 1000 units CAPS Take 2 capsules (2,000 Units total) by mouth  daily. 30 capsule   . fluticasone (FLONASE) 50 MCG/ACT nasal spray Place 1 spray into both nostrils daily.    Marland Kitchen ibuprofen (ADVIL,MOTRIN) 200 MG tablet Take 200 mg by mouth every 6 (six) hours as needed.    Marland Kitchen levothyroxine (SYNTHROID, LEVOTHROID) 150 MCG tablet Take 1 tablet (150 mcg total) by mouth daily before breakfast. (Patient taking differently: Take 75 mcg by mouth daily before breakfast. ) 30 tablet 6   No facility-administered medications prior to visit.      Per HPI unless specifically indicated in ROS section below Review of Systems  Constitutional: Negative for activity change, appetite change, chills, fatigue, fever and unexpected weight change.  HENT: Negative for hearing loss.   Eyes: Negative for visual disturbance.  Respiratory: Positive for cough (mild) and shortness of breath (mild). Negative for chest tightness and wheezing.   Cardiovascular: Negative for chest pain, palpitations and leg swelling.  Gastrointestinal: Negative for abdominal distention, abdominal pain, blood in stool, constipation, diarrhea, nausea and vomiting.  Genitourinary: Negative for difficulty urinating and hematuria.  Musculoskeletal: Negative for arthralgias, myalgias and neck pain.  Skin: Negative for rash.  Neurological: Negative for dizziness, seizures, syncope and headaches.  Hematological: Negative for adenopathy. Does not bruise/bleed easily.  Psychiatric/Behavioral: Negative for dysphoric mood. The patient is not nervous/anxious.        Objective:    BP 132/74   Pulse 77   Temp 98.3 F (36.8 C) (Oral)   Wt 197 lb 8 oz (89.6 kg)   SpO2 99%   BMI 37.32 kg/m   Wt Readings from Last 3  Encounters:  11/19/16 197 lb 8 oz (89.6 kg)  09/21/16 195 lb (88.5 kg)  08/17/16 198 lb 4 oz (89.9 kg)    Physical Exam  Constitutional: She is oriented to person, place, and time. She appears well-developed and well-nourished. No distress.  HENT:  Head: Normocephalic and atraumatic.  Right Ear:  Hearing, tympanic membrane, external ear and ear canal normal.  Left Ear: Hearing, tympanic membrane, external ear and ear canal normal.  Nose: Nose normal.  Mouth/Throat: Uvula is midline, oropharynx is clear and moist and mucous membranes are normal. No oropharyngeal exudate, posterior oropharyngeal edema or posterior oropharyngeal erythema.  Eyes: Conjunctivae and EOM are normal. Pupils are equal, round, and reactive to light. No scleral icterus.  Neck: Normal range of motion. Neck supple. No thyromegaly present.  Tenderness to palpation at right thyroid without obvious enlargement of nodules  Cardiovascular: Normal rate, regular rhythm, normal heart sounds and intact distal pulses.   No murmur heard. Pulses:      Radial pulses are 2+ on the right side, and 2+ on the left side.  Pulmonary/Chest: Effort normal and breath sounds normal. No respiratory distress. She has no wheezes. She has no rales.  Incisions c/d/I R lateral chest  Abdominal: Soft. Bowel sounds are normal. She exhibits no distension and no mass. There is no tenderness. There is no rebound and no guarding.  Musculoskeletal: Normal range of motion. She exhibits no edema.  Lymphadenopathy:    She has no cervical adenopathy.  Neurological: She is alert and oriented to person, place, and time.  CN grossly intact, station and gait intact  Skin: Skin is warm and dry. No rash noted.  Psychiatric: She has a normal mood and affect. Her behavior is normal. Judgment and thought content normal.  Nursing note and vitals reviewed.  Results for orders placed or performed in visit on 11/12/16  Lipid panel  Result Value Ref Range   Cholesterol 223 (H) 0 - 200 mg/dL   Triglycerides 53.0 0.0 - 149.0 mg/dL   HDL 74.13 >22.52 mg/dL   VLDL 91.6 0.0 - 36.9 mg/dL   LDL Cholesterol 380 (H) 0 - 99 mg/dL   Total CHOL/HDL Ratio 4    NonHDL 168.75   TSH  Result Value Ref Range   TSH 1.98 0.35 - 4.50 uIU/mL  Basic metabolic panel  Result  Value Ref Range   Sodium 138 135 - 145 mEq/L   Potassium 4.4 3.5 - 5.1 mEq/L   Chloride 105 96 - 112 mEq/L   CO2 28 19 - 32 mEq/L   Glucose, Bld 113 (H) 70 - 99 mg/dL   BUN 13 6 - 23 mg/dL   Creatinine, Ser 2.96 0.40 - 1.20 mg/dL   Calcium 9.2 8.4 - 86.9 mg/dL   GFR 57.92 >38.25 mL/min  VITAMIN D 25 Hydroxy (Vit-D Deficiency, Fractures)  Result Value Ref Range   VITD 28.76 (L) 30.00 - 100.00 ng/mL  T4, free  Result Value Ref Range   Free T4 1.19 0.60 - 1.60 ng/dL      Assessment & Plan:   Problem List Items Addressed This Visit    Chronic cough    Post-op cough vs GERD vs allergic rhinitis. rec restart flonase, PPI, update [place] with effect.       Fever blister    Valtrex Rx sent to pharmacy.       Relevant Medications   valACYclovir (VALTREX) 1000 MG tablet   Globus sensation    ?GERD vs allergy causation. rec  restart protonix and flonase.  Given discomfort to palpation R thyroid will also check R thyroid US. Pt agrees with plan.       Relevant Orders   US THYROID   Health maintenance examination - Primary    Preventative protocols reviewed and updated unless pt declined. Discussed healthy diet and lifestyle.       Hyperlipidemia    Discussed healthy diet changes to help improve readings.  ASCVD 10 yr risk score = 1.8%       Hypothyroidism    Chronic, stable. Continue current regimen.       Relevant Medications   levothyroxine (SYNTHROID, LEVOTHROID) 150 MCG tablet   Other Relevant Orders   US THYROID   Obesity, Class II, BMI 35-39.9, no comorbidity    Discussed healthy diet and lifestyle changes to affect sustainable weight loss.       Primary adenocarcinoma of upper lobe of right lung (HCC)    s/p thoracoscopic RULobectomy and lymph node dissection (negative), did not need adjuvant chemotherapy. Appreciate care of Dr Roxan Hockey and [name].       Relevant Medications   valACYclovir (VALTREX) 1000 MG tablet   Vitamin D deficiency    Slowed  improvement - continue 2000 IU daily.        Other Visit Diagnoses    Neck pain on right side       Relevant Orders   US THYROID       Follow up plan: Return in about 1 year (around 11/19/2017) for annual exam, prior fasting for blood work.  [name], MD

## 2016-11-19 NOTE — Progress Notes (Signed)
Pre visit review using our clinic review tool, if applicable. No additional management support is needed unless otherwise documented below in the visit note. 

## 2016-11-19 NOTE — Assessment & Plan Note (Signed)
Discussed healthy diet changes to help improve readings.  ASCVD 10 yr risk score = 1.8%

## 2016-11-19 NOTE — Assessment & Plan Note (Signed)
Slowed improvement - continue 2000 IU daily.

## 2016-11-19 NOTE — Assessment & Plan Note (Signed)
?  GERD vs allergy causation. rec restart protonix and flonase.  Given discomfort to palpation R thyroid will also check R thyroid US. Pt agrees with plan.

## 2016-11-19 NOTE — Addendum Note (Signed)
Addended by: Modena Nunnery on: 11/19/2016 10:43 AM   Modules accepted: Orders

## 2016-11-19 NOTE — Assessment & Plan Note (Signed)
Chronic, stable. Continue current regimen. 

## 2016-11-19 NOTE — Assessment & Plan Note (Signed)
Valtrex Rx sent to pharmacy.

## 2016-11-19 NOTE — Assessment & Plan Note (Signed)
Preventative protocols reviewed and updated unless pt declined. Discussed healthy diet and lifestyle.  

## 2016-11-26 ENCOUNTER — Other Ambulatory Visit: Payer: BLUE CROSS/BLUE SHIELD

## 2016-12-01 ENCOUNTER — Ambulatory Visit
Admission: RE | Admit: 2016-12-01 | Discharge: 2016-12-01 | Disposition: A | Discharge status: Home / Self Care | Payer: BLUE CROSS/BLUE SHIELD | Source: Ambulatory Visit | Attending: Family Medicine | Admitting: Family Medicine

## 2016-12-01 DIAGNOSIS — E041 Nontoxic single thyroid nodule: Secondary | ICD-10-CM | POA: Diagnosis not present

## 2016-12-01 DIAGNOSIS — E039 Hypothyroidism, unspecified: Secondary | ICD-10-CM

## 2016-12-01 DIAGNOSIS — R0989 Other specified symptoms and signs involving the circulatory and respiratory systems: Secondary | ICD-10-CM

## 2016-12-01 DIAGNOSIS — R198 Other specified symptoms and signs involving the digestive system and abdomen: Secondary | ICD-10-CM

## 2016-12-01 DIAGNOSIS — M542 Cervicalgia: Secondary | ICD-10-CM

## 2016-12-03 ENCOUNTER — Encounter: Payer: Self-pay | Admitting: *Deleted

## 2016-12-30 ENCOUNTER — Emergency Department (HOSPITAL_COMMUNITY): Payer: BLUE CROSS/BLUE SHIELD

## 2016-12-30 ENCOUNTER — Observation Stay (HOSPITAL_COMMUNITY)
Admission: EM | Admit: 2016-12-30 | Discharge: 2016-12-31 | Disposition: A | Discharge status: Home / Self Care | Payer: BLUE CROSS/BLUE SHIELD | Attending: Internal Medicine | Admitting: Internal Medicine

## 2016-12-30 ENCOUNTER — Encounter (HOSPITAL_COMMUNITY): Payer: Self-pay

## 2016-12-30 DIAGNOSIS — Z6834 Body mass index (BMI) 34.0-34.9, adult: Secondary | ICD-10-CM | POA: Diagnosis not present

## 2016-12-30 DIAGNOSIS — R61 Generalized hyperhidrosis: Secondary | ICD-10-CM | POA: Diagnosis not present

## 2016-12-30 DIAGNOSIS — R053 Chronic cough: Secondary | ICD-10-CM | POA: Diagnosis present

## 2016-12-30 DIAGNOSIS — R110 Nausea: Secondary | ICD-10-CM

## 2016-12-30 DIAGNOSIS — R05 Cough: Secondary | ICD-10-CM | POA: Diagnosis present

## 2016-12-30 DIAGNOSIS — Z79899 Other long term (current) drug therapy: Secondary | ICD-10-CM | POA: Diagnosis not present

## 2016-12-30 DIAGNOSIS — E559 Vitamin D deficiency, unspecified: Secondary | ICD-10-CM | POA: Diagnosis present

## 2016-12-30 DIAGNOSIS — E782 Mixed hyperlipidemia: Secondary | ICD-10-CM | POA: Diagnosis not present

## 2016-12-30 DIAGNOSIS — E039 Hypothyroidism, unspecified: Secondary | ICD-10-CM | POA: Diagnosis present

## 2016-12-30 DIAGNOSIS — I10 Essential (primary) hypertension: Secondary | ICD-10-CM | POA: Diagnosis not present

## 2016-12-30 DIAGNOSIS — Z862 Personal history of diseases of the blood and blood-forming organs and certain disorders involving the immune mechanism: Secondary | ICD-10-CM

## 2016-12-30 DIAGNOSIS — E7800 Pure hypercholesterolemia, unspecified: Secondary | ICD-10-CM

## 2016-12-30 DIAGNOSIS — E785 Hyperlipidemia, unspecified: Secondary | ICD-10-CM | POA: Diagnosis not present

## 2016-12-30 DIAGNOSIS — C3411 Malignant neoplasm of upper lobe, right bronchus or lung: Secondary | ICD-10-CM | POA: Diagnosis not present

## 2016-12-30 DIAGNOSIS — E784 Other hyperlipidemia: Secondary | ICD-10-CM | POA: Diagnosis not present

## 2016-12-30 DIAGNOSIS — R079 Chest pain, unspecified: Secondary | ICD-10-CM | POA: Insufficient documentation

## 2016-12-30 DIAGNOSIS — Z8249 Family history of ischemic heart disease and other diseases of the circulatory system: Secondary | ICD-10-CM | POA: Diagnosis not present

## 2016-12-30 DIAGNOSIS — I119 Hypertensive heart disease without heart failure: Secondary | ICD-10-CM | POA: Diagnosis not present

## 2016-12-30 DIAGNOSIS — E6609 Other obesity due to excess calories: Secondary | ICD-10-CM | POA: Diagnosis not present

## 2016-12-30 DIAGNOSIS — Z85118 Personal history of other malignant neoplasm of bronchus and lung: Secondary | ICD-10-CM | POA: Diagnosis present

## 2016-12-30 DIAGNOSIS — R0789 Other chest pain: Secondary | ICD-10-CM | POA: Diagnosis not present

## 2016-12-30 DIAGNOSIS — K219 Gastro-esophageal reflux disease without esophagitis: Secondary | ICD-10-CM | POA: Insufficient documentation

## 2016-12-30 HISTORY — DX: Sarcoidosis of lung: D86.0

## 2016-12-30 LAB — CBC
HCT: 42.9 % (ref 36.0–46.0)
HEMATOCRIT: 39.5 % (ref 36.0–46.0)
HEMOGLOBIN: 14.1 g/dL (ref 12.0–15.0)
Hemoglobin: 12.9 g/dL (ref 12.0–15.0)
MCH: 27.3 pg (ref 26.0–34.0)
MCH: 26.9 pg (ref 26.0–34.0)
MCHC: 32.9 g/dL (ref 30.0–36.0)
MCHC: 32.7 g/dL (ref 30.0–36.0)
MCV: 83 fL (ref 78.0–100.0)
MCV: 82.3 fL (ref 78.0–100.0)
PLATELETS: 236 10*3/uL (ref 150–400)
Platelets: 231 10*3/uL (ref 150–400)
RBC: 5.17 MIL/uL — AB (ref 3.87–5.11)
RBC: 4.8 MIL/uL (ref 3.87–5.11)
RDW: 13.4 % (ref 11.5–15.5)
RDW: 13.5 % (ref 11.5–15.5)
WBC: 7.4 10*3/uL (ref 4.0–10.5)
WBC: 7.8 10*3/uL (ref 4.0–10.5)

## 2016-12-30 LAB — BASIC METABOLIC PANEL
ANION GAP: 14 (ref 5–15)
BUN: 17 mg/dL (ref 6–20)
CHLORIDE: 104 mmol/L (ref 101–111)
CO2: 24 mmol/L (ref 22–32)
Calcium: 9.7 mg/dL (ref 8.9–10.3)
Creatinine, Ser: 0.59 mg/dL (ref 0.44–1.00)
GFR calc non Af Amer: >60 mL/min (ref >60)
Glucose, Bld: 99 mg/dL (ref 65–99)
POTASSIUM: 4.1 mmol/L (ref 3.5–5.1)
SODIUM: 142 mmol/L (ref 135–145)

## 2016-12-30 LAB — TROPONIN I: Troponin I: <0.03 ng/mL (ref <0.03)

## 2016-12-30 LAB — LIPID PANEL
Cholesterol: 256 mg/dL — ABNORMAL HIGH (ref 0–200)
HDL: 62 mg/dL (ref >40)
LDL CALC: 167 mg/dL — AB (ref 0–99)
TRIGLYCERIDES: 133 mg/dL (ref <150)
Total CHOL/HDL Ratio: 4.1 RATIO
VLDL: 27 mg/dL (ref 0–40)

## 2016-12-30 LAB — I-STAT TROPONIN, ED: Troponin i, poc: 0.01 ng/mL (ref 0.00–0.08)

## 2016-12-30 LAB — CREATININE, SERUM
Creatinine, Ser: 0.72 mg/dL (ref 0.44–1.00)
GFR calc Af Amer: >60 mL/min (ref >60)
GFR calc non Af Amer: >60 mL/min (ref >60)

## 2016-12-30 MED ORDER — PANTOPRAZOLE SODIUM 40 MG PO TBEC
40.0000 mg | DELAYED_RELEASE_TABLET | Freq: Every day | ORAL | Status: DC
  Administered 2016-12-30 – 2016-12-31 (×2): 40 mg via ORAL
  Filled 2016-12-30 (×2): qty 1

## 2016-12-30 MED ORDER — ALBUTEROL SULFATE (2.5 MG/3ML) 0.083% IN NEBU: 2.5000 mg | INHALATION_SOLUTION | Freq: Four times a day (QID) | RESPIRATORY_TRACT | Status: DC | PRN

## 2016-12-30 MED ORDER — LEVOTHYROXINE SODIUM 150 MCG PO TABS: 150.0000 ug | ORAL_TABLET | Freq: Every day | ORAL | Status: DC

## 2016-12-30 MED ORDER — ASPIRIN EC 325 MG PO TBEC
325.0000 mg | DELAYED_RELEASE_TABLET | Freq: Every day | ORAL | Status: DC
  Administered 2016-12-31: 325 mg via ORAL
  Filled 2016-12-30: qty 1

## 2016-12-30 MED ORDER — HYDRALAZINE HCL 20 MG/ML IJ SOLN: 5.0000 mg | INTRAMUSCULAR | Status: DC | PRN

## 2016-12-30 MED ORDER — NITROGLYCERIN 0.4 MG SL SUBL: 0.4000 mg | SUBLINGUAL_TABLET | SUBLINGUAL | Status: DC | PRN

## 2016-12-30 MED ORDER — ENOXAPARIN SODIUM 40 MG/0.4ML ~~LOC~~ SOLN
40.0000 mg | SUBCUTANEOUS | Status: DC
  Administered 2016-12-30: 40 mg via SUBCUTANEOUS
  Filled 2016-12-30: qty 0.4

## 2016-12-30 MED ORDER — KETOROLAC TROMETHAMINE 15 MG/ML IJ SOLN: 15.0000 mg | Freq: Four times a day (QID) | INTRAMUSCULAR | Status: DC | PRN

## 2016-12-30 MED ORDER — MORPHINE SULFATE (PF) 4 MG/ML IV SOLN
4.0000 mg | Freq: Once | INTRAVENOUS | Status: AC
  Administered 2016-12-30: 4 mg via INTRAVENOUS
  Filled 2016-12-30: qty 1

## 2016-12-30 MED ORDER — ACETAMINOPHEN 325 MG PO TABS
650.0000 mg | ORAL_TABLET | ORAL | Status: DC | PRN
  Administered 2016-12-30: 650 mg via ORAL
  Filled 2016-12-30: qty 2

## 2016-12-30 MED ORDER — VITAMIN D3 25 MCG (1000 UNIT) PO TABS
2000.0000 [IU] | ORAL_TABLET | Freq: Every day | ORAL | Status: DC
  Administered 2016-12-30 – 2016-12-31 (×2): 2000 [IU] via ORAL
  Filled 2016-12-30 (×4): qty 2

## 2016-12-30 MED ORDER — ONDANSETRON HCL 4 MG/2ML IJ SOLN: 4.0000 mg | Freq: Four times a day (QID) | INTRAMUSCULAR | Status: DC | PRN

## 2016-12-30 MED ORDER — ASPIRIN 81 MG PO CHEW
324.0000 mg | CHEWABLE_TABLET | Freq: Once | ORAL | Status: AC
  Administered 2016-12-30: 324 mg via ORAL
  Filled 2016-12-30: qty 4

## 2016-12-30 MED ORDER — ROSUVASTATIN CALCIUM 10 MG PO TABS
10.0000 mg | ORAL_TABLET | Freq: Every day | ORAL | Status: DC
  Administered 2016-12-30: 10 mg via ORAL
  Filled 2016-12-30: qty 1

## 2016-12-30 MED ORDER — LOSARTAN POTASSIUM 25 MG PO TABS
25.0000 mg | ORAL_TABLET | Freq: Every day | ORAL | Status: DC
  Administered 2016-12-30 – 2016-12-31 (×2): 25 mg via ORAL
  Filled 2016-12-30 (×2): qty 1

## 2016-12-30 MED ORDER — SODIUM CHLORIDE 0.9 % IV SOLN
Freq: Once | INTRAVENOUS | Status: AC
  Administered 2016-12-30: 12:00:00 via INTRAVENOUS

## 2016-12-30 MED ORDER — MORPHINE SULFATE (PF) 4 MG/ML IV SOLN: 2.0000 mg | INTRAVENOUS | Status: DC | PRN

## 2016-12-30 MED ORDER — FLUTICASONE PROPIONATE 50 MCG/ACT NA SUSP
1.0000 | Freq: Every day | NASAL | Status: DC
  Filled 2016-12-30: qty 16

## 2016-12-30 MED ORDER — GI COCKTAIL ~~LOC~~: 30.0000 mL | Freq: Four times a day (QID) | ORAL | Status: DC | PRN

## 2016-12-30 NOTE — ED Notes (Signed)
Provider at bedside

## 2016-12-30 NOTE — ED Triage Notes (Signed)
Pt states she was at work and had sudden onset of non radiating chest pain with diaphoresis. Pt denies any other symptoms. No cardiac hx. Pt alert and oriented, skin warm and dry at this time.

## 2016-12-30 NOTE — Consult Note (Signed)
Cardiology Consult    Patient ID: Monica [name] MRN: [id number], DOB/AGE: 52-Dec-1967   Admit date: 12/30/2016 Date of Consult: 12/30/2016  Primary Physician: [name], MD Primary Cardiologist: New Requesting Provider: Renard Hamper, NP Reason for Consultation: Chest pain  Monica [name] is a 51 y.o. female who is being seen today for the evaluation of chest pain at the request of Dyanne Carrel, NP.   Patient Profile    51 yo female with PMH of HTN, hypothyroidism, sarcoidosis, and adenocarcinoma of R Lung s/p RU lobectomy who presented with chest pain and diaphoresis.   Past Medical History   Past Medical History:  Diagnosis Date  . Chest pain   . History of chicken pox   . History of hypertension   . Hypothyroidism   . Primary adenocarcinoma of upper lobe of right lung ([place]) 05/28/2016   RUL apex spiculated ~2cm nodule concerning by PET scan - referred to multidisciplinary thoracic oncology clinic S/p thoracoscopic RULobectomy Roxan Hockey) 06/2016  . Sarcoidosis ~2004   treated with prendisone and stable since then    Past Surgical History:  Procedure Laterality Date  . COLONOSCOPY  09/2015   WNL [name])  . EYE SURGERY Bilateral 1998   Lasik  . LOBECTOMY Right 06/14/2016   RU Lobectomy; Surgeon: Melrose Nakayama, MD  . LYMPH NODE DISSECTION Right 06/14/2016   RIGHT UPPER LOBE LYMPH NODE DISSECTION;  Surgeon: Melrose Nakayama, MD  . TUBAL LIGATION  2001  . VIDEO ASSISTED THORACOSCOPY (VATS)/WEDGE RESECTION Right 06/14/2016   Surgeon: Melrose Nakayama, MD     Allergies  No Known Allergies  History of Present Illness    Monica [name] is a 51 yo female with PMH of HTN, hypothyroidism, sarcoidosis, and adenocarcinoma of R Lung s/p RU lobectomy. Reports she underwent VATS in 11/17 with Dr. Roxan Hockey with successful RU Lobectomy. Followed as outpatient at the [facility] cancer center, no chemo or radiation as lymph nodes were clear. States she has  never seen a cardiologist in the past. Was on HTN medications but blood pressure improved once she lost weight, and medications were stopped. Told her lipids and blood sugars were borderline at her last outpatient visit in 4/18 with her PCP. Family hx of CAD with grandparents but none with parents.    States she is not very active since having VATS done, and is easily winded with activity. 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 Was in her usual state of health until this morning. She was working with a patient, and developed left sided sharp chest pain, then became diaphoretic. Went an sat down in break room, co worker placed cold towels on her. States she was dripping with sweat. Did not take anything, but symptoms subsided after about 10 minutes. Also felt a little nauseated, and lightheaded. Co-workers wanted to called EMS, but opted to have them drive her to the ED as her work is across the street.   In the ED her labs showed stable electrolytes, Trop neg x1, Hgb 14.1. CXR neg. EKG showed SR with baseline diffuse TWI in anterolateral leads. No further chest pain since being admitted.   Inpatient Medications    . [START ON 12/31/2016] aspirin EC  325 mg Oral Daily  . cholecalciferol  2,000 Units Oral Daily  . enoxaparin (LOVENOX) injection  40 mg Subcutaneous Q24H  . fluticasone  1 spray Each Nare Daily  . pantoprazole  40 mg Oral Daily    Family History  Family History  Problem Relation Age of Onset  . Cancer Mother 53       breast with met to bone  . Hypertension Mother   . Cancer Father 11       prostate  . CAD Maternal Grandmother        MI  . Stroke Maternal Grandmother   . Stroke Paternal Grandmother   . CAD Paternal Grandmother   . Diabetes Paternal Grandfather     Social History    Social History   Social History  . Marital status: Married    Spouse name: N/A  . Number of children: N/A  . Years of education: N/A   Occupational History  . Not on  file.   Social History Main Topics  . Smoking status: Never Smoker  . Smokeless tobacco: Never Used  . Alcohol use No  . Drug use: No  . Sexual activity: Not on file   Other Topics Concern  . 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  Physical Exam    Blood pressure (!) 144/65, pulse 76, temperature 97.9 F (36.6 C), temperature source Oral, resp. rate 15, height _0  (1.575 m), SpO2 100 %.  General: Pleasant obese female, NAD Psych: Normal affect. Neuro: Alert and oriented X 3. Moves all extremities spontaneously. HEENT: Normal  Neck: Supple without bruits or JVD. Lungs:  Resp regular and unlabored, CTA. Heart: RRR no s3, s4, or murmurs. Abdomen: Soft, non-tender, non-distended, BS + x 4.  Extremities: No clubbing, cyanosis or edema. DP/PT/Radials 2+ and equal bilaterally.  Labs    Troponin [facility] of Care Test)  Recent Labs  12/30/16 0906  TROPIPOC 0.01   No results for input(s): CKTOTAL, CKMB, TROPONINI in the last 72 hours. Lab Results  Component Value Date   WBC 7.4 12/30/2016   HGB 14.1 12/30/2016   HCT 42.9 12/30/2016   MCV 83.0 12/30/2016   PLT 236 12/30/2016    Recent Labs Lab 12/30/16 0832  NA 142  K 4.1  CL 104  CO2 24  BUN 17  CREATININE 0.59  CALCIUM 9.7  GLUCOSE 99   Lab Results  Component Value Date   CHOL 256 (H) 12/30/2016   HDL 62 12/30/2016   LDLCALC 167 (H) 12/30/2016   TRIG 133 12/30/2016   No results found for: [facility]   Radiology Studies    Dg Chest 2 View  Result Date: 12/30/2016 CLINICAL DATA:  History of lung carcinoma and sarcoidosis. Chest pain. EXAM: CHEST  2 VIEW COMPARISON:  September 21, 2016 FINDINGS: There is postoperative  change on the right with areas of volume loss, stable. There is no edema or consolidation. Heart size and pulmonary vascularity are normal. No evident adenopathy. No appreciable bone lesions. IMPRESSION: Stable appearance compared to prior study. Postoperative change on the right with volume loss. No edema or consolidation. No mass or adenopathy evident. Stable cardiac silhouette. Electronically Signed   By: Lowella Grip III M.D.   On: 12/30/2016 08:54    ECG & Cardiac Imaging    EKG: SR with known TWI in anterolateral leads  Assessment & Plan    51 yo female with PMH of  HTN, hypothyroidism, sarcoidosis, and adenocarcinoma of R Lung s/p RU lobectomy who presented with chest pain and diaphoresis.   1. Chest pain: Sudden onset while working today, centralized sharp in nature, and became diaphoretic. Lasted about 10 minutes and then subsided. No further pain since then. Trop neg x1. Has been on BP medications in the past, but improved and was taken off. Has been borderline with lipids and blood sugar. Non smoker.  -- cycle trop -- plan for exercise myoview in the morning.  -- echo  -- check Hgb A1c as she reports a hx of elevated fasting blood sugar.   2. HL: LDL 167, would add Crestor 7m daily   3. HTN: Blood pressure is elevated on admission. Add losartan 219mdaily    Signed, LiReino BellisNP-C Pager [phone]/31/2018, 2:26 PM   The patient was seen, examined and discussed with LiReino BellisNP-C and I agree with the above.   [age]o female with PMH of HTN, hypothyroidism, sarcoidosis, and adenocarcinoma of R Lung s/p RU lobectomy. Reports she underwent VATS in 11/17 with Dr. HeRoxan Hockeyith successful RU Lobectomy. [facility], no chemo or radiation as lymph nodes were clear. States she has never seen a cardiologist in the past. Was on HTN medications but blood pressure improved once she lost weight, and medications were stopped.    The patient is very active and has noticed some shortness of breath since was diagnosed and treated for lung cancer never felt chest pain before. 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  Today while she was lifting her hand she felt squeezing like chest pain in her left chest radiating to her axilla with diaphoresis. It lasted about 2 hours and resolved when she obtained morphine in the hospital. She has never felt pain like that before. There is family history of coronary artery disease in 2 or her grandparents but not in her parents. She was told previously that she has elevated cholesterol but was never treated.  Her EKG shows sinus rhythm and negative T waves in anterolateral leads that are present on EKG in November 2017. So far 1 troponin has been negative. We will continue cycle troponins repeat EKG. We will plan for an excised nuclear stress test tomorrow. We'll start patient on Crestor 10 mg daily. If her stress test is negative will discharge with Crestor 10 mg daily, losartan 25 mg daily and follow-up in our clinic with repeat labs in 3-4 weeks. We will also obtain an echocardiogram to evaluate LV EF of motion abnormalities and degree of LVH.  KaEna DawleyMD 12/30/2016

## 2016-12-30 NOTE — ED Provider Notes (Signed)
[facility] DEPT Provider Note   CSN: [id number] Arrival date & time: 12/30/16  0815     History   Chief Complaint Chief Complaint  Patient presents with  . Chest Pain    HPI Monica [name] is a 51 y.o. 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 She describes the pain as 6/10 constant left-sided chest pressure that was nonradiating, worse with specific arm movements, and unrelieved with inking water. She did not notice a change in her pain with exertion or inspiration. Associated symptoms include diaphoresis, flushed feeling, nausea, and lightheadedness with standing. She states that the other symptoms have somewhat subsided, and her chest pressure is down to a 2/10 now, but is still somewhat nagging and present. +FHx of MI in her MGF (unknown age). Nonsmoker. PCP Dr. Danise Mina at [facility].   Denies fevers, chills, new/worse cough, SOB, LE swelling, recent travel/surgery/immobilization, estrogen use, personal/family hx of DVT/PE, abd pain, V/D/C, hematuria, dysuria, myalgias, arthralgias, claudication, orthopnea, numbness, tingling, focal weakness, or any other complaints at this time.    The history is provided by the patient and medical records. No language interpreter was used.  Chest Pain   This is a new problem. The current episode started 3 to 5 hours ago. The problem occurs constantly. The problem has been gradually improving. The pain is associated with rest. The pain is present in the lateral region. The pain is at a severity of 6/10. The pain is moderate. The quality of the pain is described as pressure-like. The pain does not radiate. Duration of episode(s) is 3 hours. The symptoms are  aggravated by certain positions. Associated symptoms include diaphoresis and nausea. Pertinent negatives include no abdominal pain, no claudication, no cough, no fever, no lower extremity edema, no numbness, no orthopnea, no shortness of breath, no vomiting and no weakness. She has tried nothing for the symptoms. The treatment provided no relief.  Her past medical history is significant for cancer, diabetes (prediabetic), hyperlipidemia and hypertension.  Pertinent negatives for past medical history include no DVT and no PE.  Her family medical history is significant for CAD.    Past Medical History:  Diagnosis Date  . History of chicken pox   . History of hypertension   . Hypothyroidism   . Primary adenocarcinoma of upper lobe of right lung ([facility]) 05/28/2016   RUL apex spiculated ~2cm nodule concerning by PET scan - referred to multidisciplinary thoracic oncology clinic S/p thoracoscopic RULobectomy Roxan Hockey) 06/2016  . Sarcoidosis ~2004   treated with prendisone and stable since then    Patient Active Problem List   Diagnosis Date Noted  . Hyperlipidemia 11/19/2016  . Fever blister 11/19/2016  . Chronic cough 08/17/2016  . ETD (Eustachian tube dysfunction), right 08/17/2016  . Globus sensation 07/09/2016  . Primary adenocarcinoma of upper lobe of right lung ([place]) 05/28/2016  . Health maintenance examination 04/01/2015  . Vitamin D deficiency 04/01/2015  . Obesity, Class II, BMI 35-39.9, no comorbidity 04/01/2015  . History of sarcoidosis 10/10/2014  . Hypothyroidism 10/10/2014    Past Surgical History:  Procedure Laterality Date  . COLONOSCOPY  09/2015   WNL [name])  . EYE SURGERY Bilateral 1998   Lasik  . LOBECTOMY Right 06/14/2016  RU Lobectomy; Surgeon: Melrose Nakayama, MD  . LYMPH NODE DISSECTION Right 06/14/2016   RIGHT UPPER LOBE LYMPH NODE DISSECTION;  Surgeon: Melrose Nakayama, MD  . TUBAL LIGATION  2001  . VIDEO ASSISTED THORACOSCOPY (VATS)/WEDGE  RESECTION Right 06/14/2016   Surgeon: Melrose Nakayama, MD    OB History    No data available       Home Medications    Prior to Admission medications   Medication Sig Start Date End Date Taking? Authorizing Provider  albuterol (PROVENTIL HFA;VENTOLIN HFA) 108 (90 Base) MCG/ACT inhaler Inhale 2 puffs into the lungs every 6 (six) hours as needed for wheezing or shortness of breath. 08/17/16   Bedsole, Amy E, MD  Cholecalciferol (VITAMIN D3) 1000 units CAPS Take 2 capsules (2,000 Units total) by mouth daily. 05/22/16   [name], MD  fluticasone [facility]) 50 MCG/ACT nasal spray Place 1 spray into both nostrils daily.    [provider]  ibuprofen (ADVIL,MOTRIN) 200 MG tablet Take 200 mg by mouth every 6 (six) hours as needed.    [provider]  levothyroxine (SYNTHROID, LEVOTHROID) 150 MCG tablet Take 1 tablet (150 mcg total) by mouth daily before breakfast. 11/19/16   [name], MD  pantoprazole (PROTONIX) 40 MG tablet Take 1 tablet (40 mg total) by mouth daily. 11/19/16   [name], MD  valACYclovir (VALTREX) 1000 MG tablet Take 2 tablets (2,000 mg total) by mouth 2 (two) times daily. For 1 day 11/19/16   [name], MD    Family History Family History  Problem Relation Age of Onset  . Cancer Mother 10       breast with met to bone  . Hypertension Mother   . Cancer Father 85       prostate  . CAD Maternal Grandmother        MI  . Stroke Maternal Grandmother   . Stroke Paternal Grandmother   . CAD Paternal Grandmother   . Diabetes Paternal Grandfather     Social History Social History  Substance Use Topics  . Smoking status: Never Smoker  . Smokeless tobacco: Never Used  . Alcohol use No     Allergies   Patient has no known allergies.   Review of Systems Review of Systems  Constitutional: Positive for diaphoresis. Negative for chills and fever.  Respiratory: Negative for cough and shortness of breath.     Cardiovascular: Positive for chest pain. Negative for orthopnea, claudication and leg swelling.  Gastrointestinal: Positive for nausea. Negative for abdominal pain, constipation, diarrhea and vomiting.  Genitourinary: Negative for dysuria and hematuria.  Musculoskeletal: Negative for arthralgias and myalgias.  Skin: Negative for color change.  Allergic/Immunologic: Positive for immunocompromised state (prediabetes).  Neurological: Positive for light-headedness. Negative for weakness and numbness.  Psychiatric/Behavioral: Negative for confusion.   All other systems reviewed and are negative for acute change except as noted in the HPI.    Physical Exam Updated Vital Signs BP 138/76   Pulse 86   Temp 98 F (36.7 C) (Oral)   Resp 18   SpO2 100%   Physical Exam  Constitutional: She is oriented to person, place, and time. Vital signs are normal. She appears well-developed and well-nourished.  Non-toxic appearance. No distress.  Afebrile, nontoxic, NAD  HENT:  Head: Normocephalic and atraumatic.  Mouth/Throat: Oropharynx is clear and moist and mucous membranes are normal.  Eyes: Conjunctivae and EOM are normal. Right eye exhibits no discharge. Left eye exhibits no discharge.  Neck: Normal  range of motion. Neck supple.  Cardiovascular: Normal rate, regular rhythm, normal heart sounds and intact distal pulses.  Exam reveals no gallop and no friction rub.   No murmur heard. RRR, nl s1/s2, no m/r/g, distal pulses intact, no pedal edema   Pulmonary/Chest: Effort normal and breath sounds normal. No respiratory distress. She has no decreased breath sounds. She has no wheezes. She has no rhonchi. She has no rales. She exhibits tenderness. She exhibits no crepitus, no deformity and no retraction.    CTAB in all lung fields, no w/r/r, no hypoxia or increased WOB, speaking in full sentences, SpO2 99% on RA Chest wall nonTTP without crepitus, deformities, or retractions   Abdominal: Soft. Normal  appearance and bowel sounds are normal. She exhibits no distension. There is no tenderness. There is no rigidity, no rebound, no guarding, no CVA tenderness, no tenderness at McBurney's point and negative Murphy's sign.  Musculoskeletal: Normal range of motion.  MAE x4 Strength and sensation grossly intact in all extremities Distal pulses intact Gait steady No pedal edema, neg homan's bilaterally   Neurological: She is alert and oriented to person, place, and time. She has normal strength. No sensory deficit.  Skin: Skin is warm, dry and intact. No rash noted.  Psychiatric: She has a normal mood and affect.  Nursing note and vitals reviewed.    ED Treatments / Results  Labs (all labs ordered are listed, but only abnormal results are displayed) Labs Reviewed  CBC - Abnormal; Notable for the following:       Result Value   RBC 5.17 (*)    All other components within normal limits  BASIC METABOLIC PANEL  HIV ANTIBODY (ROUTINE TESTING)  TROPONIN I  TROPONIN I  TROPONIN I  CBC  CREATININE, SERUM  HEMOGLOBIN A1C  LIPID PANEL  I-STAT TROPOININ, ED    EKG  EKG Interpretation  Date/Time:  Thursday Dec 30 2016 08:19:07 EDT Ventricular Rate:  85 PR Interval:  138 QRS Duration: 80 QT Interval:  376 QTC Calculation: 447 R Axis:   77 Text Interpretation:  Normal sinus rhythm Nonspecific T wave abnormality Abnormal ECG Confirmed by ZAMMIT  MD, JOSEPH [phone]) on 12/30/2016 10:18:47 AM       Radiology Dg Chest 2 View  Result Date: 12/30/2016 CLINICAL DATA:  History of lung carcinoma and sarcoidosis. Chest pain. EXAM: CHEST  2 VIEW COMPARISON:  September 21, 2016 FINDINGS: There is postoperative change on the right with areas of volume loss, stable. There is no edema or consolidation. Heart size and pulmonary vascularity are normal. No evident adenopathy. No appreciable bone lesions. IMPRESSION: Stable appearance compared to prior study. Postoperative change on the right with volume  loss. No edema or consolidation. No mass or adenopathy evident. Stable cardiac silhouette. Electronically Signed   By: Lowella Grip III M.D.   On: 12/30/2016 08:54    Procedures Procedures (including critical care time)  Medications Ordered in ED Medications  nitroGLYCERIN (NITROSTAT) SL tablet 0.4 mg (not administered)  levothyroxine (SYNTHROID, LEVOTHROID) tablet 150 mcg (not administered)  pantoprazole (PROTONIX) EC tablet 40 mg (not administered)  albuterol (PROVENTIL HFA;VENTOLIN HFA) 108 (90 Base) MCG/ACT inhaler 2 puff (not administered)  fluticasone (FLONASE) 50 MCG/ACT nasal spray 1 spray (not administered)  Vitamin D3 CAPS 2,000 Units (not administered)  acetaminophen (TYLENOL) tablet 650 mg (not administered)  ondansetron (ZOFRAN) injection 4 mg (not administered)  enoxaparin (LOVENOX) injection 40 mg (not administered)  morphine 4 MG/ML injection 2 mg (not administered)  gi  cocktail (Maalox,Lidocaine,Donnatal) (not administered)  aspirin EC tablet 325 mg (not administered)  aspirin chewable tablet 324 mg (324 mg Oral Given 12/30/16 1124)  morphine 4 MG/ML injection 4 mg (4 mg Intravenous Given 12/30/16 1124)     Initial Impression / Assessment and Plan / ED Course  I have reviewed the triage vital signs and the nursing notes.  Pertinent labs & imaging results that were available during my care of the patient were reviewed by me and considered in my medical decision making (see chart for details).     51 y.o. female here with sudden onset CP, diaphoresis, nausea, and lightheadedness with standing, starting while seated at work. On exam, some reproducibility of L chest wall pain, no tachycardia or hypoxia, clear lungs. EKG relatively unchanged from prior, except QRS complexes slightly smaller in amplitude. Trop neg at 1hr mark. BMP and CBC WNL. CXR without acute findings. Given moderate risk factors, and concerning story, discussed option of repeat trop at 3hrs vs  admission for ACS r/o, and pt and I feel most comfortable with proceeding with admission. Will give ASA, morphine, and NTG now. Will consult hospitalist for admission. Discussed case with my attending Dr. [name] who agrees with plan.   11:13 AM Dyanne Carrel NP of [facility] returning page and will admit. Holding orders to be placed by admitting team. Please see their notes for further documentation of care. I appreciate their help with this pleasant pt's care. Pt stable at time of admission.    Final Clinical Impressions(s) / ED Diagnoses   Final diagnoses:  Chest pain at rest  Nausea  Diaphoresis  Hypertension, unspecified type    New Prescriptions New Prescriptions   No medications on [address], [place], [place] 12/30/16 [place]    Milton Ferguson, MD 01/01/17 [phone]

## 2016-12-30 NOTE — H&P (Signed)
History and Physical    Monica [name]:[id number] DOB: 1965/11/01 DOA: 12/30/2016  PCP: [name], MD Patient coming from: work  Chief Complaint: chest pain  HPI: Monica [name] is a very pleasant 51 y.o. female with medical history significant for sarcoidosis (2003), hypertension, hypothyroidism, hyperlipidemia, obesity, adenocarcinoma right upper lobe lung presents to emergency Department chief complaint sudden onset chest pain. [facility] asked to admit to rule out  Information is obtained from the patient. She states she's been in her usual state of health until this morning she developed sudden onset left anterior chest pain. She describes the pain as a "tightness and feeling like a pulling". Associated symptoms include diaphoresis shortness of breath nausea without vomiting and lightheadedness.. She rates the pain a 6 out of 10 nonradiating. She does state that movement made it worse. She drank some water without relief. She denies headache palpitations lower shin edema orthopnea. She does report a chronic cough since her lobectomy in November but that is at baseline. She denies abdominal pain dysuria hematuria frequency or urgency. She denies diarrhea constipation melena bright red blood per rectum. She denies fever chills recent travel or sick contacts. She states the pain subsided down to a 2 out of 10 without intervention.   ED Course: Emergency department she's afebrile somewhat hypertensive no hypoxia nontoxic appearing. She is also a provided with 324 mg of aspirin 2 mg morphine  Review of Systems: As per HPI otherwise all other systems reviewed and are negative.   Ambulatory Status: Ambulates independently independent with ADLs no recent falls  Past Medical History:  Diagnosis Date  . Chest pain   . History of chicken pox   . History of hypertension   . Hypothyroidism   . Primary adenocarcinoma of upper lobe of right lung ([place]) 05/28/2016   RUL apex spiculated  ~2cm nodule concerning by PET scan - referred to multidisciplinary thoracic oncology clinic S/p thoracoscopic RULobectomy Roxan Hockey) 06/2016  . Sarcoidosis ~2004   treated with prendisone and stable since then    Past Surgical History:  Procedure Laterality Date  . COLONOSCOPY  09/2015   WNL [name])  . EYE SURGERY Bilateral 1998   Lasik  . LOBECTOMY Right 06/14/2016   RU Lobectomy; Surgeon: Melrose Nakayama, MD  . LYMPH NODE DISSECTION Right 06/14/2016   RIGHT UPPER LOBE LYMPH NODE DISSECTION;  Surgeon: Melrose Nakayama, MD  . TUBAL LIGATION  2001  . VIDEO ASSISTED THORACOSCOPY (VATS)/WEDGE RESECTION Right 06/14/2016   Surgeon: Melrose Nakayama, MD    Social History   Social History  . Marital status: Married    Spouse name: N/A  . Number of children: N/A  . Years of education: N/A   Occupational History  . Not on file.   Social History Main Topics  . Smoking status: Never Smoker  . Smokeless tobacco: Never Used  . Alcohol use No  . Drug use: No  . Sexual activity: Not on file   Other Topics Concern  . 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 Cancer Mother 55       breast with met to bone  . Hypertension Mother   . Cancer Father 61  prostate  . CAD Maternal Grandmother        MI  . Stroke Maternal Grandmother   . Stroke Paternal Grandmother   . CAD Paternal Grandmother   . Diabetes Paternal Grandfather     Prior to Admission medications   Medication Sig Start Date End Date Taking? Authorizing Provider  albuterol (PROVENTIL HFA;VENTOLIN HFA) 108 (90 Base) MCG/ACT inhaler Inhale 2 puffs into the lungs every 6 (six) hours as needed for wheezing or shortness of breath. 08/17/16    Bedsole, Amy E, MD  Cholecalciferol (VITAMIN D3) 1000 units CAPS Take 2 capsules (2,000 Units total) by mouth daily. 05/22/16   [name], MD  fluticasone [facility]) 50 MCG/ACT nasal spray Place 1 spray into both nostrils daily.    [provider]  ibuprofen (ADVIL,MOTRIN) 200 MG tablet Take 200 mg by mouth every 6 (six) hours as needed.    [provider]  levothyroxine (SYNTHROID, LEVOTHROID) 150 MCG tablet Take 1 tablet (150 mcg total) by mouth daily before breakfast. 11/19/16   [name], MD  pantoprazole (PROTONIX) 40 MG tablet Take 1 tablet (40 mg total) by mouth daily. 11/19/16   [name], MD  valACYclovir (VALTREX) 1000 MG tablet Take 2 tablets (2,000 mg total) by mouth 2 (two) times daily. For 1 day 11/19/16   [name], MD    Physical Exam: Vitals:   12/30/16 1045 12/30/16 1100 12/30/16 1115 12/30/16 1130  BP: 129/89 (!) 150/100 (!) 159/96 (!) 170/93  Pulse: 80 83 81 90  Resp: _0 Temp:      TempSrc:      SpO2: 100% 100% 99% 100%     General:  Appears calm and comfortable, sitting up in bed no acute distress Eyes:  PERRL, EOMI, normal lids, iris ENT:  grossly normal hearing, lips & tongue, mucous membranes of her mouth are moist and pink Neck:  no LAD, masses or thyromegaly Cardiovascular:  RRR, no m/r/g. Chest slightly tender to palpation on left anterior side near axilla.  No LE edema. Pedal pulses present and palpable Respiratory:  Normal effort breath sounds slightly diminished on right otherwise clear no wheeze no rhonchi Abdomen:  Obese soft positive bowel sounds throughout no guarding or rebounding Skin:  no rash or induration seen on limited exam Musculoskeletal:  grossly normal tone BUE/BLE, good ROM, no bony abnormality Psychiatric:  grossly normal mood and affect, speech fluent and appropriate, AOx3 Neurologic:  CN 2-12 grossly intact, moves all extremities in coordinated fashion, sensation intact  Labs on  Admission: I have personally reviewed following labs and imaging studies  CBC:  Recent Labs Lab 12/30/16 0832  WBC 7.4  HGB 14.1  HCT 42.9  MCV 83.0  PLT 841   Basic Metabolic Panel:  Recent Labs Lab 12/30/16 0832  NA 142  K 4.1  CL 104  CO2 24  GLUCOSE 99  BUN 17  CREATININE 0.59  CALCIUM 9.7   GFR: CrCl cannot be calculated (Unknown ideal weight.). Liver Function Tests: No results for input(s): AST, ALT, ALKPHOS, BILITOT, PROT, ALBUMIN in the last 168 hours. No results for input(s): LIPASE, AMYLASE in the last 168 hours. No results for input(s): AMMONIA in the last 168 hours. Coagulation Profile: No results for input(s): INR, PROTIME in the last 168 hours. Cardiac Enzymes: No results for input(s): CKTOTAL, CKMB, CKMBINDEX, TROPONINI in the last 168 hours. BNP (last 3 results) No results for input(s): PROBNP in the last 8760 hours. HbA1C: No results for input(s):  HGBA1C in the last 72 hours. CBG: No results for input(s): GLUCAP in the last 168 hours. Lipid Profile: No results for input(s): CHOL, HDL, LDLCALC, TRIG, CHOLHDL, LDLDIRECT in the last 72 hours. Thyroid Function Tests: No results for input(s): TSH, T4TOTAL, FREET4, T3FREE, THYROIDAB in the last 72 hours. Anemia Panel: No results for input(s): VITAMINB12, FOLATE, FERRITIN, TIBC, IRON, RETICCTPCT in the last 72 hours. Urine analysis:    Component Value Date/Time   COLORURINE YELLOW 06/11/2016 1352   APPEARANCEUR CLOUDY (A) 06/11/2016 1352   LABSPEC 1.028 06/11/2016 1352   PHURINE 6.0 06/11/2016 1352   GLUCOSEU NEGATIVE 06/11/2016 1352   HGBUR TRACE (A) 06/11/2016 1352   BILIRUBINUR NEGATIVE 06/11/2016 1352   KETONESUR NEGATIVE 06/11/2016 1352   PROTEINUR NEGATIVE 06/11/2016 1352   UROBILINOGEN 0.2 12/10/2013 0817   NITRITE NEGATIVE 06/11/2016 1352   LEUKOCYTESUR NEGATIVE 06/11/2016 1352    Creatinine Clearance: CrCl cannot be calculated (Unknown ideal weight.).  Sepsis  Labs: _0 (procalcitonin:4,lacticidven:4) )No results found for this or any previous visit (from the past 240 hour(s)).   Radiological Exams on Admission: Dg Chest 2 View  Result Date: 12/30/2016 CLINICAL DATA:  History of lung carcinoma and sarcoidosis. Chest pain. EXAM: CHEST  2 VIEW COMPARISON:  September 21, 2016 FINDINGS: There is postoperative change on the right with areas of volume loss, stable. There is no edema or consolidation. Heart size and pulmonary vascularity are normal. No evident adenopathy. No appreciable bone lesions. IMPRESSION: Stable appearance compared to prior study. Postoperative change on the right with volume loss. No edema or consolidation. No mass or adenopathy evident. Stable cardiac silhouette. Electronically Signed   By: Lowella Grip III M.D.   On: 12/30/2016 08:54    EKG: Independently reviewed. Normal sinus rhythm Nonspecific T wave abnormality Abnormal ECG Assessment/Plan Principal Problem:   Chest pain at rest Active Problems:   History of sarcoidosis   Hypothyroidism   Vitamin D deficiency   Primary adenocarcinoma of upper lobe of right lung ([facility])   Chronic cough   Hyperlipidemia   HTN (hypertension)   #1. Chest pain at rest. Heart score 4 some typical and atypical features. Pain is somewhat reproducible. Chest x-ray unremarkable EKG without acute changes. Initial troponin negative. Risk factors include obesity hyperlipidemia family medical history, hypertension. time of my exam she rated the pain as 2 out of 10 and she received aspirin morphine and nitroglycerin. -Admit to telemetry -Cycle troponin -Serial EKG -Supportive therapy -Continue aspirin -gi cocktail -avoid narcotics as pt reacts with flushing, nausea, mildly diaphoretic -Lipid panel, TSH, hemoglobin A1c -Cardiology consult -may benefit stress test either inpatient or OP  #2. Hypertension. Patient slightly hypertensive in the emergency department. She has a history of  hypertension in the past but has not required medication for the last 10 years due to weight loss. -Monitor -When necessary hydralazine  #3. Hyperlipidemia. Not currently on a statin. -Obtain a lipid panel  #4. Hypothyroidism. She reports she is no longer on Synthroid -Obtain a TSH  #5. Primary adenocarcinoma of the upper lobe right lung status post lobectomy 6 months ago. No follow up chemo required per Onc note dated 12/17. Stable at baseline chest x-ray as noted above -continue plan per Oncology (surveilance)      DVT prophylaxis: lovenox Code Status: full  Family Communication: friend at bedside Disposition Plan: home hopefully 24 hours  Consults called: cardmaster Admission status: obs    Radene Gunning MD [facility]  If 7PM-7AM, please contact night-coverage [email or web address] Password [email or web address]  12/30/2016, 11:46 AM

## 2016-12-30 NOTE — ED Notes (Signed)
Karen Black, NP at bedside 

## 2016-12-30 NOTE — Care Management Note (Signed)
Case Management Note  Patient Details  Name: Monica [name] MRN: [id number] Date of Birth: 1965/09/09  Subjective/Objective:                 Patient in observation for atypical chest pain. From home with husband and son.    Action/Plan:  CM will continue to monitor.  Expected Discharge Date:                  Expected Discharge Plan:  Home/Self Care  In-House Referral:     Discharge planning Services  CM Consult  Post Acute Care Choice:    Choice offered to:     DME Arranged:    DME Agency:     HH Arranged:    HH Agency:     Status of Service:  In process, will continue to follow  If discussed at Long Length of Stay Meetings, dates discussed:    Additional Comments:  Carles Collet, RN 12/30/2016, 2:26 PM

## 2016-12-31 ENCOUNTER — Observation Stay (HOSPITAL_BASED_OUTPATIENT_CLINIC_OR_DEPARTMENT_OTHER): Payer: BLUE CROSS/BLUE SHIELD

## 2016-12-31 DIAGNOSIS — E039 Hypothyroidism, unspecified: Secondary | ICD-10-CM | POA: Diagnosis not present

## 2016-12-31 DIAGNOSIS — C3411 Malignant neoplasm of upper lobe, right bronchus or lung: Secondary | ICD-10-CM

## 2016-12-31 DIAGNOSIS — I10 Essential (primary) hypertension: Secondary | ICD-10-CM | POA: Diagnosis not present

## 2016-12-31 DIAGNOSIS — E785 Hyperlipidemia, unspecified: Secondary | ICD-10-CM | POA: Diagnosis not present

## 2016-12-31 DIAGNOSIS — E782 Mixed hyperlipidemia: Secondary | ICD-10-CM

## 2016-12-31 DIAGNOSIS — R61 Generalized hyperhidrosis: Secondary | ICD-10-CM | POA: Diagnosis not present

## 2016-12-31 DIAGNOSIS — Z862 Personal history of diseases of the blood and blood-forming organs and certain disorders involving the immune mechanism: Secondary | ICD-10-CM | POA: Diagnosis not present

## 2016-12-31 DIAGNOSIS — R079 Chest pain, unspecified: Secondary | ICD-10-CM | POA: Diagnosis not present

## 2016-12-31 DIAGNOSIS — K219 Gastro-esophageal reflux disease without esophagitis: Secondary | ICD-10-CM

## 2016-12-31 DIAGNOSIS — I119 Hypertensive heart disease without heart failure: Secondary | ICD-10-CM

## 2016-12-31 DIAGNOSIS — R0789 Other chest pain: Secondary | ICD-10-CM | POA: Diagnosis not present

## 2016-12-31 HISTORY — PX: CARDIOVASCULAR STRESS TEST: SHX262

## 2016-12-31 LAB — NM MYOCAR MULTI W/SPECT W/WALL MOTION / EF
CHL CUP RESTING HR STRESS: 83 {beats}/min
CHL RATE OF PERCEIVED EXERTION: 18
CSEPEDS: 41 s
CSEPHR: 96 %
CSEPPHR: 162 {beats}/min
Estimated workload: 7 METS
Exercise duration (min): 5 min
MPHR: 168 {beats}/min

## 2016-12-31 LAB — HEMOGLOBIN A1C
Hgb A1c MFr Bld: 4.9 % (ref 4.8–5.6)
Mean Plasma Glucose: 94 mg/dL

## 2016-12-31 LAB — HIV ANTIBODY (ROUTINE TESTING W REFLEX): HIV SCREEN 4TH GENERATION: NONREACTIVE

## 2016-12-31 MED ORDER — LOSARTAN POTASSIUM 25 MG PO TABS: 25.0000 mg | ORAL_TABLET | Freq: Every day | ORAL | 0 refills | Status: DC

## 2016-12-31 MED ORDER — FLUTICASONE PROPIONATE 50 MCG/ACT NA SUSP: 1.0000 | Freq: Every day | NASAL | 0 refills | Status: AC | PRN

## 2016-12-31 MED ORDER — ROSUVASTATIN CALCIUM 10 MG PO TABS: 10.0000 mg | ORAL_TABLET | Freq: Every day | ORAL | 0 refills | Status: DC

## 2016-12-31 MED ORDER — HYDROCHLOROTHIAZIDE 25 MG PO TABS: 25.0000 mg | ORAL_TABLET | Freq: Every day | ORAL | 0 refills | Status: DC

## 2016-12-31 MED ORDER — TECHNETIUM TC 99M TETROFOSMIN IV KIT: 10.0000 | PACK | Freq: Once | INTRAVENOUS | Status: DC | PRN

## 2016-12-31 MED ORDER — ASPIRIN 325 MG PO TBEC: 325.0000 mg | DELAYED_RELEASE_TABLET | Freq: Every day | ORAL | 0 refills | Status: DC

## 2016-12-31 MED ORDER — ASPIRIN EC 81 MG PO TBEC: 81.0000 mg | DELAYED_RELEASE_TABLET | Freq: Every day | ORAL | 0 refills | Status: DC

## 2016-12-31 NOTE — Progress Notes (Addendum)
Progress Note  Patient Name: MODESTY RUDY Date of Encounter: 12/31/2016  Primary Cardiologist: new / Dr Meda Coffee  Subjective   No chest pain today  Inpatient Medications    Scheduled Meds: . aspirin EC  325 mg Oral Daily  . cholecalciferol  2,000 Units Oral Daily  . enoxaparin (LOVENOX) injection  40 mg Subcutaneous Q24H  . fluticasone  1 spray Each Nare Daily  . losartan  25 mg Oral Daily  . pantoprazole  40 mg Oral Daily  . rosuvastatin  10 mg Oral q1800   Continuous Infusions:  PRN Meds: acetaminophen, albuterol, gi cocktail, hydrALAZINE, ketorolac, nitroGLYCERIN, ondansetron (ZOFRAN) IV, technetium tetrofosmin   Vital Signs    Vitals:   12/31/16 0949 12/31/16 0950 12/31/16 0953 12/31/16 1130  BP: (!) 178/99 (!) 198/88 (!) 180/89 117/64  Pulse:    97  Resp:      Temp:      TempSrc:      SpO2:      Height:        Intake/Output Summary (Last 24 hours) at 12/31/16 1154 Last data filed at 12/31/16 0800  Gross per 24 hour  Intake              720 ml  Output                0 ml  Net              720 ml   There were no vitals filed for this visit.  Telemetry    SR - Personally Reviewed  ECG    SR - Personally Reviewed  Physical Exam   GEN: No acute distress.   Neck: No JVD Cardiac: RRR, no murmurs, rubs, or gallops.  Respiratory: Clear to auscultation bilaterally. GI: Soft, nontender, non-distended  MS: No edema; No deformity. Neuro:  Nonfocal  Psych: Normal affect   Labs    Chemistry Recent Labs Lab 12/30/16 0832 12/30/16 1421  NA 142  --   K 4.1  --   CL 104  --   CO2 24  --   GLUCOSE 99  --   BUN 17  --   CREATININE 0.59 0.72  CALCIUM 9.7  --   GFRNONAA >60 >60  GFRAA >60 >60  ANIONGAP 14  --      Hematology Recent Labs Lab 12/30/16 0832 12/30/16 1421  WBC 7.4 7.8  RBC 5.17* 4.80  HGB 14.1 12.9  HCT 42.9 39.5  MCV 83.0 82.3  MCH 27.3 26.9  MCHC 32.9 32.7  RDW 13.4 13.5  PLT 236 231    Cardiac Enzymes Recent  Labs Lab 12/30/16 1421 12/30/16 1921  TROPONINI <0.03 <0.03    Recent Labs Lab 12/30/16 0906  TROPIPOC 0.01     BNPNo results for input(s): BNP, PROBNP in the last 168 hours.   DDimer No results for input(s): DDIMER in the last 168 hours.   Radiology    Dg Chest 2 View  Result Date: 12/30/2016 CLINICAL DATA:  History of lung carcinoma and sarcoidosis. Chest pain. EXAM: CHEST  2 VIEW COMPARISON:  September 21, 2016 FINDINGS: There is postoperative change on the right with areas of volume loss, stable. There is no edema or consolidation. Heart size and pulmonary vascularity are normal. No evident adenopathy. No appreciable bone lesions. IMPRESSION: Stable appearance compared to prior study. Postoperative change on the right with volume loss. No edema or consolidation. No mass or adenopathy evident. Stable cardiac silhouette. Electronically Signed  By: Lowella Grip III M.D.   On: 12/30/2016 08:54    Cardiac Studies     Patient Profile     51 y.o. female   [place]    1. Chest pain 2. Hypertension 3. Hyperlipidemia  51 yo female with PMH of HTN, hypothyroidism, sarcoidosis, and adenocarcinoma of R Lung s/p RU lobectomy. Reports she underwent VATS in 11/17 with Dr. Roxan Hockey with successful RU Lobectomy. Followed as outpatient at the [facility] cancer center, no chemo or radiation as lymph nodes were clear. States she has never seen a cardiologist in the past. Was on HTN medications but blood pressure improved once she lost weight, and medications were stopped.   The patient is very active and has noticed some shortness of breath since was diagnosed and treated for lung cancer never felt chest pain before. 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  Today while she was lifting her hand she felt squeezing like chest pain in her left chest radiating to her axilla with diaphoresis. It lasted about 2 hours  and resolved when she obtained morphine in the hospital. She has never felt pain like that before. There is family history of coronary artery disease in 2 or her grandparents but not in her parents. She was told previously that she has elevated cholesterol but was never treated.  Her EKG shows sinus rhythm and negative T waves in anterolateral leads that are present on EKG in November 2017.  Troponin negative x 3.  An exercise nuclear stress test this morning showed  Hypertensive response to stress, LVEF 68%, no prior scar or ischemia. I would add HCTZ 25 mg po daily to her hypertensive regimen.  She can be discharged home, we will arrange for a follow up for hypertension and hyperlipidemia with labs - repeat lids and CMP. We will also obtain an echocardiogram to evaluate LV EF of motion abnormalities and degree of LVH. Echo can be done as outpatient.  Signed, Ena Dawley, MD  12/31/2016, 11:54 AM

## 2016-12-31 NOTE — Discharge Summary (Signed)
Physician Discharge Summary  Monica [name] PIR:[id number] DOB: 07-Jan-1966 DOA: 12/30/2016  PCP: [name], MD  Admit date: 12/30/2016 Discharge date: 12/31/2016  Time spent: 45 minutes  Recommendations for Outpatient Follow-up:  Patient will be discharged to home.  Patient will need to follow up with primary care provider within one week of discharge.  Follow up with cardiology on 01/26/2017 at 9am. Repeat CMP and cholesterol labs. Patient should continue medications as prescribed.  Patient should follow a heart healthy diet.   Discharge Diagnoses:  Chest pain Essential hypertension Hyperlipidemia Hypothyroidism Primary adenocarcinoma of the RUL  Discharge Condition: Stable  Diet recommendation: Heart healthy  There were no vitals filed for this visit.  History of present illness:  On 12/30/2016 by [name], NP Monica [name] is a very pleasant 51 y.o. female with medical history significant for sarcoidosis (2003), hypertension, hypothyroidism, hyperlipidemia, obesity, adenocarcinoma right upper lobe lung presents to emergency Department chief complaint sudden onset chest pain. [facility] asked to admit to rule out  Information is obtained from the patient. She states she's been in her usual state of health until this morning she developed sudden onset left anterior chest pain. She describes the pain as a "tightness and feeling like a pulling". Associated symptoms include diaphoresis shortness of breath nausea without vomiting and lightheadedness.. She rates the pain a 6 out of 10 nonradiating. She does state that movement made it worse. She drank some water without relief. She denies headache palpitations lower shin edema orthopnea. She does report a chronic cough since her lobectomy in November but that is at baseline. She denies abdominal pain dysuria hematuria frequency or urgency. She denies diarrhea constipation melena bright red blood per rectum. She denies fever  chills recent travel or sick contacts. She states the pain subsided down to a 2 out of 10 without intervention.  Hospital Course:  Chest pain -troponin cycled and negative x 3 -EKG showed inverted [place] anterolateral leads (seen in Nov 2017) -Cardiology consulted and appreciated -Lexiscan this morning: showed hypertensive response to stress, LVEF 68%, no prior scar or ischemia -Per cardiology recommendations, patient may follow up with them in the office. They will arrange appointment and repeat labs. Patient can have outpatient echocardiogram as well -Continue aspirin, statin -hemoglobin A1c 4.9, LDL 167  Essential hypertension -was not on home medications as she has lost weight -Losartan added on admission -Cardiology recommended HCTZ 25mg  daily  Hyperlipidemia -Currently not on a statin at home -lipid panel: TC 256, HDL 62, LDL 167, TG 133 -Crestor added   Hypothyroidism -Continue synthroid -TSH 4.9 (WNL)  Primary adenocarcinoma of the RUL -s/p lobectomy 6 months ago -No chemotherapy given -Continue to follow up with oncology, Dr. Julien Nordmann   GERD -Continue PPI  Procedures: [place]  Consultations: Cardiology  Discharge Exam: Vitals:   12/31/16 1130 12/31/16 1303  BP: 117/64 (!) 146/80  Pulse: 97 81  Resp:  20  Temp:  98.6 F (37 C)   Patient denies further chest pain, shortness of breath. Denies abdominal pain, N/V/D/C.   General: Well developed, well nourished, NAD, appears stated age  HEENT: NCAT, mucous membranes moist.  Cardiovascular: S1 S2 auscultated, no rubs, murmurs or gallops. Regular rate and rhythm.  Respiratory: Clear to auscultation bilaterally with equal chest rise  Abdomen: Soft, nontender, nondistended, + bowel sounds  Extremities: warm dry without cyanosis clubbing or edema  Neuro: AAOx3, nonfocal  Psych: Normal affect and demeanor with intact judgement and insight  Discharge Instructions Discharge Instructions  Discharge  instructions    Complete by:  As directed    Patient will be discharged to home.  Patient will need to follow up with primary care provider within one week of discharge.  Follow up with cardiology on 01/26/2017 at 9am. Repeat CMP and cholesterol labs. Patient should continue medications as prescribed.  Patient should follow a heart healthy diet.     Current Discharge Medication List    START taking these medications   Details  aspirin 325 MG EC tablet Take 1 tablet (325 mg total) by mouth daily. Qty: 30 tablet, Refills: 0    hydrochlorothiazide (HYDRODIURIL) 25 MG tablet Take 1 tablet (25 mg total) by mouth daily. Qty: 30 tablet, Refills: 0    losartan (COZAAR) 25 MG tablet Take 1 tablet (25 mg total) by mouth daily. Qty: 30 tablet, Refills: 0    rosuvastatin (CRESTOR) 10 MG tablet Take 1 tablet (10 mg total) by mouth daily at 6 PM. Qty: 30 tablet, Refills: 0      CONTINUE these medications which have CHANGED   Details  fluticasone (FLONASE) 50 MCG/ACT nasal spray Place 1 spray into both nostrils daily as needed for allergies. Qty: 16 g, Refills: 0   Associated Diagnoses: Primary adenocarcinoma of upper lobe of right lung ([place]); History of sarcoidosis      CONTINUE these medications which have NOT CHANGED   Details  albuterol (PROVENTIL HFA;VENTOLIN HFA) 108 (90 Base) MCG/ACT inhaler Inhale 2 puffs into the lungs every 6 (six) hours as needed for wheezing or shortness of breath. Qty: 1 Inhaler, Refills: 2    Cholecalciferol (VITAMIN D3) 1000 units CAPS Take 2 capsules (2,000 Units total) by mouth daily. Qty: 30 capsule    ibuprofen (ADVIL,MOTRIN) 200 MG tablet Take 400 mg by mouth every 6 (six) hours as needed for mild pain.    Associated Diagnoses: Primary adenocarcinoma of upper lobe of right lung ([place]); History of sarcoidosis    levothyroxine (SYNTHROID, LEVOTHROID) 150 MCG tablet Take 1 tablet (150 mcg total) by mouth daily before breakfast. Qty: 30 tablet, Refills: 11      pantoprazole (PROTONIX) 40 MG tablet Take 1 tablet (40 mg total) by mouth daily. Qty: 30 tablet, Refills: 3    valACYclovir (VALTREX) 1000 MG tablet Take 2 tablets (2,000 mg total) by mouth 2 (two) times daily. For 1 day Qty: 12 tablet, Refills: 3       No Known Allergies Follow-up Information    Dunn, Dayna N, PA-C Follow up.   Specialties:  Cardiology, Radiology Why:  on 01/26/17 at 9:00 for cardiology hospital follow up.  Contact information: [address] [place] [address] [phone]        [name], MD. Schedule an appointment as soon as possible for a visit in 1 week(s).   Specialty:  Family Medicine Why:  Hospital follow up Contact information: [place] [address] [phone]            The results of significant diagnostics from this hospitalization (including imaging, microbiology, ancillary and laboratory) are listed below for reference.    Significant Diagnostic Studies: Dg Chest 2 View  Result Date: 12/30/2016 CLINICAL DATA:  History of lung carcinoma and sarcoidosis. Chest pain. EXAM: CHEST  2 VIEW COMPARISON:  September 21, 2016 FINDINGS: There is postoperative change on the right with areas of volume loss, stable. There is no edema or consolidation. Heart size and pulmonary vascularity are normal. No evident adenopathy. No  appreciable bone lesions. IMPRESSION: Stable appearance compared to prior study. Postoperative change on the right with volume loss. No edema or consolidation. No mass or adenopathy evident. Stable cardiac silhouette. Electronically Signed   By: Lowella Grip III M.D.   On: 12/30/2016 08:54   US Thyroid  Result Date: 12/01/2016 CLINICAL DATA:  Right thyroid tenderness EXAM: THYROID ULTRASOUND TECHNIQUE: Ultrasound examination of the thyroid gland and adjacent soft tissues was performed. COMPARISON:  None available FINDINGS: Parenchymal Echotexture: Mildly heterogenous Isthmus:  3 mm Right lobe: 4.1 x 1.0 x 1.4 cm Left lobe: 3.5 x 1.0 x 1.2 cm _________________________________________________________ Estimated total number of nodules >/= 1 cm: 0 Number of spongiform nodules >/=  2 cm not described below (TR1): 0 Number of mixed cystic and solid nodules >/= 1.5 cm not described below (TR2): 0 _________________________________________________________ No discrete nodules are seen within the thyroid gland. IMPRESSION: Mild thyroid heterogeneity without discrete nodule or focal abnormality. The above is in keeping with the ACR TI-RADS recommendations - J Am Coll Radiol 2017;14:587-595. Electronically Signed   By: Jerilynn Mages.  Shick M.D.   On: 12/01/2016 16:33    Microbiology: No results found for this or any previous visit (from the past 240 hour(s)).   Labs: Basic Metabolic Panel:  Recent Labs Lab 12/30/16 0832 12/30/16 1421  NA 142  --   K 4.1  --   CL 104  --   CO2 24  --   GLUCOSE 99  --   BUN 17  --   CREATININE 0.59 0.72  CALCIUM 9.7  --    Liver Function Tests: No results for input(s): AST, ALT, ALKPHOS, BILITOT, PROT, ALBUMIN in the last 168 hours. No results for input(s): LIPASE, AMYLASE in the last 168 hours. No results for input(s): AMMONIA in the last 168 hours. CBC:  Recent Labs Lab 12/30/16 0832 12/30/16 1421  WBC 7.4 7.8  HGB 14.1 12.9  HCT 42.9 39.5  MCV 83.0 82.3  PLT 236 231   Cardiac Enzymes:  Recent Labs Lab 12/30/16 1421 12/30/16 1921  TROPONINI <0.03 <0.03   BNP: BNP (last 3 results) No results for input(s): BNP in the last 8760 hours.  ProBNP (last 3 results) No results for input(s): PROBNP in the last 8760 hours.  CBG: No results for input(s): GLUCAP in the last 168 hours.     SignedCristal Ford  [facility] 12/31/2016, 1:40 PM

## 2016-12-31 NOTE — Discharge Instructions (Signed)
Nonspecific Chest Pain °Chest pain can be caused by many different conditions. There is always a chance that your pain could be related to something serious, such as a heart attack or a blood clot in your lungs. Chest pain can also be caused by conditions that are not life-threatening. If you have chest pain, it is very important to follow up with your health care provider. °What are the causes? °Causes of this condition include: °· Heartburn. °· Pneumonia or bronchitis. °· Anxiety or stress. °· Inflammation around your heart (pericarditis) or lung (pleuritis or pleurisy). °· A blood clot in your lung. °· A collapsed lung (pneumothorax). This can develop suddenly on its own (spontaneous pneumothorax) or from trauma to the chest. °· Shingles infection (varicella-zoster virus). °· Heart attack. °· Damage to the bones, muscles, and cartilage that make up your chest wall. This can include: °? Bruised bones due to injury. °? Strained muscles or cartilage due to frequent or repeated coughing or overwork. °? Fracture to one or more ribs. °? Sore cartilage due to inflammation (costochondritis). ° °What increases the risk? °Risk factors for this condition may include: °· Activities that increase your risk for trauma or injury to your chest. °· Respiratory infections or conditions that cause frequent coughing. °· Medical conditions or overeating that can cause heartburn. °· Heart disease or family history of heart disease. °· Conditions or health behaviors that increase your risk of developing a blood clot. °· Having had chicken pox (varicella zoster). ° °What are the signs or symptoms? °Chest pain can feel like: °· Burning or tingling on the surface of your chest or deep in your chest. °· Crushing, pressure, aching, or squeezing pain. °· Dull or sharp pain that is worse when you move, cough, or take a deep breath. °· Pain that is also felt in your back, neck, shoulder, or arm, or pain that spreads to any of these  areas. ° °Your chest pain may come and go, or it may stay constant. °How is this diagnosed? °Lab tests or other studies may be needed to find the cause of your pain. Your health care provider may have you take a test called an ECG (electrocardiogram). An ECG records your heartbeat patterns at the time the test is performed. You may also have other tests, such as: °· Transthoracic echocardiogram (TTE). In this test, sound waves are used to create a picture of the heart structures and to look at how blood flows through your heart. °· Transesophageal echocardiogram (TEE). This is a more advanced imaging test that takes images from inside your body. It allows your health care provider to see your heart in finer detail. °· Cardiac monitoring. This allows your health care provider to monitor your heart rate and rhythm in real time. °· Holter monitor. This is a portable device that records your heartbeat and can help to diagnose abnormal heartbeats. It allows your health care provider to track your heart activity for several days, if needed. °· Stress tests. These can be done through exercise or by taking medicine that makes your heart beat more quickly. °· Blood tests. °· Other imaging tests. ° °How is this treated? °Treatment depends on what is causing your chest pain. Treatment may include: °· Medicines. These may include: °? Acid blockers for heartburn. °? Anti-inflammatory medicine. °? Pain medicine for inflammatory conditions. °? Antibiotic medicine, if an infection is present. °? Medicines to dissolve blood clots. °? Medicines to treat coronary artery disease (CAD). °· Supportive care for conditions that   do not require medicines. This may include: °? Resting. °? Applying heat or cold packs to injured areas. °? Limiting activities until pain decreases. ° °Follow these instructions at home: °Medicines °· If you were prescribed an antibiotic, take it as told by your health care provider. Do not stop taking the  antibiotic even if you start to feel better. °· Take over-the-counter and prescription medicines only as told by your health care provider. °Lifestyle °· Do not use any products that contain nicotine or tobacco, such as cigarettes and e-cigarettes. If you need help quitting, ask your health care provider. °· Do not drink alcohol. °· Make lifestyle changes as directed by your health care provider. These may include: °? Getting regular exercise. Ask your health care provider to suggest some activities that are safe for you. °? Eating a heart-healthy diet. A registered dietitian can help you to learn healthy eating options. °? Maintaining a healthy weight. °? Managing diabetes, if necessary. °? Reducing stress, such as with yoga or relaxation techniques. °General instructions °· Avoid any activities that bring on chest pain. °· If heartburn is the cause for your chest pain, raise (elevate) the head of your bed about 6 inches (15 cm) by putting blocks under the legs. Sleeping with more pillows does not effectively relieve heartburn because it only changes the position of your head. °· Keep all follow-up visits as told by your health care provider. This is important. This includes any further testing if your chest pain does not go away. °Contact a health care provider if: °· Your chest pain does not go away. °· You have a rash with blisters on your chest. °· You have a fever. °· You have chills. °Get help right away if: °· Your chest pain is worse. °· You have a cough that gets worse, or you cough up blood. °· You have severe pain in your abdomen. °· You have severe weakness. °· You faint. °· You have sudden, unexplained chest discomfort. °· You have sudden, unexplained discomfort in your arms, back, neck, or jaw. °· You have shortness of breath at any time. °· You suddenly start to sweat, or your skin gets clammy. °· You feel nauseous or you vomit. °· You suddenly feel light-headed or dizzy. °· Your heart begins to beat  quickly, or it feels like it is skipping beats. °These symptoms may represent a serious problem that is an emergency. Do not wait to see if the symptoms will go away. Get medical help right away. Call your local emergency services (911 in the [place] Do not drive yourself to the hospital. °This information is not intended to replace advice given to you by your health care provider. Make sure you discuss any questions you have with your health care provider. °Document Released: 04/28/2005 Document Revised: 04/12/2016 Document Reviewed: 04/12/2016 °Elsevier Interactive Patient Education © 2017 [employer] ° °

## 2016-12-31 NOTE — Progress Notes (Addendum)
Exercise MV performed, 1 day study, [facility] to read.  Rosaria Ferries, Hershal Coria 12/31/2016 9:56 AM [facility] [phone]

## 2016-12-31 NOTE — Progress Notes (Signed)
Nutrition Brief Note  Received MD consult for assessment of nutrition status. Patient with no weight loss within the past 3 years.  Wt Readings from Last 15 Encounters:  11/19/16 197 lb 8 oz (89.6 kg)  09/21/16 195 lb (88.5 kg)  08/17/16 198 lb 4 oz (89.9 kg)  07/22/16 194 lb 9.6 oz (88.3 kg)  07/09/16 191 lb 8 oz (86.9 kg)  06/14/16 199 lb 4.7 oz (90.4 kg)  06/11/16 191 lb 12.8 oz (87 kg)  06/08/16 194 lb (88 kg)  05/21/16 194 lb (88 kg)  08/01/15 193 lb 9.6 oz (87.8 kg)  04/01/15 197 lb 12 oz (89.7 kg)  10/10/14 192 lb 12.8 oz (87.5 kg)  12/10/13 185 lb (83.9 kg)   Ht Readings from Last 3 Encounters:  12/30/16 5\' 2"  (1.575 m)  09/21/16 5\' 1"  (1.549 m)  08/17/16 5\' 1"  (1.549 m)    BMI=36.1 Patient meets criteria for obesity based on current BMI.   Current diet order is regular, patient is consuming approximately 75% of meals at this time. Labs and medications reviewed.   She thinks she is being discharged home today.  Nutrition focused physical exam completed.  No muscle or subcutaneous fat depletion noticed.  Patient is well-nourished.  No nutrition interventions warranted at this time. If nutrition issues arise, please consult RD.   Molli Barrows, RD, LDN, [place] Pager [phone] After Hours Pager [phone]

## 2017-01-01 ENCOUNTER — Encounter: Payer: Self-pay | Admitting: Family Medicine

## 2017-01-07 ENCOUNTER — Ambulatory Visit (INDEPENDENT_AMBULATORY_CARE_PROVIDER_SITE_OTHER): Payer: BLUE CROSS/BLUE SHIELD | Admitting: Family Medicine

## 2017-01-07 ENCOUNTER — Encounter: Payer: Self-pay | Admitting: Family Medicine

## 2017-01-07 VITALS — BP 136/78 | HR 67 | Temp 98.3°F | Ht 62.0 in | Wt 195.0 lb

## 2017-01-07 DIAGNOSIS — I10 Essential (primary) hypertension: Secondary | ICD-10-CM

## 2017-01-07 DIAGNOSIS — E782 Mixed hyperlipidemia: Secondary | ICD-10-CM

## 2017-01-07 DIAGNOSIS — R0789 Other chest pain: Secondary | ICD-10-CM

## 2017-01-07 DIAGNOSIS — E669 Obesity, unspecified: Secondary | ICD-10-CM

## 2017-01-07 DIAGNOSIS — E66812 Obesity, class 2: Secondary | ICD-10-CM

## 2017-01-07 NOTE — Assessment & Plan Note (Signed)
Losartan and hctz were started. I asked her to return in 1 wk for lab visit only to check Cr/K. Continue current meds at this time.

## 2017-01-07 NOTE — Progress Notes (Signed)
BP 136/78   Pulse 67   Temp 98.3 F (36.8 C)   Ht 5\' 2"  (1.575 m)   Wt 195 lb (88.5 kg)   SpO2 98%   BMI 35.67 kg/m    CC: hosp f/u visit  Subjective:    Patient ID: Monica [name], female    DOB: May 21, 1966, 51 y.o.   MRN: [id number]  HPI: Monica [name] is a 51 y.o. female presenting on 01/07/2017 for Hospitalization Follow-up (d/c from [facility] on 6/1)   Monica [name] was hospitalized overnight last week after presenting to ER with sudden onset left anterior chest pain described as pulling tightness, associated with dyspnea and diaphoresis. Records reviewed. She had normal CXR and cardiac enzymes. EKG was stable with inverted T waves anteriolaterally (unchanged from 06/2016). LDL 167, started on crestor. Planned with cardiology scheduled for 01/26/2017, to consider echocardiogram at that time. Started on losartan and hctz and aspirin 81mg  daily.   In hospital, treadmill stress test was overall low risk, did show hypertensive response to exercise as well as up-sloping ST segment depression leads I, II, III, V3, V4, V5, V6. EF 68%.   She has felt better since she's been home. Notes persistent L anterior shoulder/axillary soreness "feels like muscle soreness". Denies recent increased exertion or injury.  She has started walking more regularly, feels this is helping.  Known h/o stage IA lung adenocarcinoma s/p thoracoscopic RU lobectomy 06/2016 - adjuvant chemo was not needed.  UTD mammogram (05/2016 with Dr Monica [name]).  Admit date: 12/30/2016 Discharge date: 12/31/2016 F/u phone call not performed: pt not eligible for TCM  Recommendations for Outpatient Follow-up:  Patient will be discharged to home.  Patient will need to follow up with primary care provider within one week of discharge.  Follow up with cardiology on 01/26/2017 at 9am. Repeat CMP and cholesterol labs. Patient should continue medications as prescribed.  Patient should follow a heart healthy diet.   Discharge Diagnoses:    Chest pain Essential hypertension Hyperlipidemia Hypothyroidism Primary adenocarcinoma of the RUL  Discharge Condition: Stable  Diet recommendation: Heart healthy  Relevant past medical, surgical, family and social history reviewed and updated as indicated. Interim medical history since our last visit reviewed. Allergies and medications reviewed and updated. Outpatient Medications Prior to Visit  Medication Sig Dispense Refill  . albuterol (PROVENTIL HFA;VENTOLIN HFA) 108 (90 Base) MCG/ACT inhaler Inhale 2 puffs into the lungs every 6 (six) hours as needed for wheezing or shortness of breath. 1 Inhaler 2  . aspirin 81 MG tablet Take 1 tablet (81 mg total) by mouth daily. 30 tablet 0  . Cholecalciferol (VITAMIN D3) 1000 units CAPS Take 2 capsules (2,000 Units total) by mouth daily. (Patient taking differently: Take 2,000 Units by mouth at bedtime. ) 30 capsule   . fluticasone (FLONASE) 50 MCG/ACT nasal spray Place 1 spray into both nostrils daily as needed for allergies. 16 g 0  . hydrochlorothiazide (HYDRODIURIL) 25 MG tablet Take 1 tablet (25 mg total) by mouth daily. 30 tablet 0  . ibuprofen (ADVIL,MOTRIN) 200 MG tablet Take 400 mg by mouth every 6 (six) hours as needed for mild pain.     Marland Kitchen levothyroxine (SYNTHROID, LEVOTHROID) 150 MCG tablet Take 1 tablet (150 mcg total) by mouth daily before breakfast. (Patient taking differently: Take 75 mcg by mouth daily before breakfast. ) 30 tablet 11  . losartan (COZAAR) 25 MG tablet Take 1 tablet (25 mg total) by mouth daily. 30 tablet 0  . pantoprazole (PROTONIX) 40  MG tablet Take 1 tablet (40 mg total) by mouth daily. (Patient taking differently: Take 40 mg by mouth at bedtime. ) 30 tablet 3  . rosuvastatin (CRESTOR) 10 MG tablet Take 1 tablet (10 mg total) by mouth daily at 6 PM. 30 tablet 0  . valACYclovir (VALTREX) 1000 MG tablet Take 2 tablets (2,000 mg total) by mouth 2 (two) times daily. For 1 day (Patient taking differently: Take 2,000  mg by mouth 2 (two) times daily as needed (fever blisters). For 1 day) 12 tablet 3   No facility-administered medications prior to visit.      Per HPI unless specifically indicated in ROS section below Review of Systems     Objective:    BP 136/78   Pulse 67   Temp 98.3 F (36.8 C)   Ht 5\' 2"  (1.575 m)   Wt 195 lb (88.5 kg)   SpO2 98%   BMI 35.67 kg/m   Wt Readings from Last 3 Encounters:  01/07/17 195 lb (88.5 kg)  12/31/16 197 lb (89.4 kg)  11/19/16 197 lb 8 oz (89.6 kg)    Physical Exam  Constitutional: She appears well-developed and well-nourished. No distress.  HENT:  Mouth/Throat: Oropharynx is clear and moist. No oropharyngeal exudate.  Cardiovascular: Normal rate, regular rhythm, normal heart sounds and intact distal pulses.   No murmur heard. Pulmonary/Chest: Effort normal and breath sounds normal. No respiratory distress. She has no wheezes. She has no rales. She exhibits tenderness.  Tender to palpation left lateral pectoral muscle without palpable lymph node  Musculoskeletal: She exhibits no edema.  Skin: Skin is warm and dry. No rash noted.  Psychiatric: She has a normal mood and affect.  Nursing note and vitals reviewed.  Results for orders placed or performed during the hospital encounter of 12/30/16  NM Myocar Multi W/Spect W/Wall Motion / EF  Result Value Ref Range   Rest HR 83 bpm   Rest BP 134/91 mmHg   RPE 18    Exercise duration (sec) 41 sec   Percent HR 96 %   Exercise duration (min) 5 min   Estimated workload 7.0 METS   Peak HR 162 bpm   Peak BP 198/88 mmHg   MPHR 168 bpm  Basic metabolic panel  Result Value Ref Range   Sodium 142 135 - 145 mmol/L   Potassium 4.1 3.5 - 5.1 mmol/L   Chloride 104 101 - 111 mmol/L   CO2 24 22 - 32 mmol/L   Glucose, Bld 99 65 - 99 mg/dL   BUN 17 6 - 20 mg/dL   Creatinine, Ser 0.59 0.44 - 1.00 mg/dL   Calcium 9.7 8.9 - 10.3 mg/dL   GFR calc non Af Amer >60 >60 mL/min   GFR calc Af Amer >60 >60 mL/min    Anion gap 14 5 - 15  CBC  Result Value Ref Range   WBC 7.4 4.0 - 10.5 K/uL   RBC 5.17 (H) 3.87 - 5.11 MIL/uL   Hemoglobin 14.1 12.0 - 15.0 g/dL   HCT 42.9 36.0 - 46.0 %   MCV 83.0 78.0 - 100.0 fL   MCH 27.3 26.0 - 34.0 pg   MCHC 32.9 30.0 - 36.0 g/dL   RDW 13.4 11.5 - 15.5 %   Platelets 236 150 - 400 K/uL  HIV antibody (Routine Testing)  Result Value Ref Range   HIV Screen 4th Generation wRfx Non Reactive Non Reactive  Troponin I-serum (0, 3, 6 hours)  Result Value Ref Range  Troponin I <0.03 <0.03 ng/mL  Troponin I-serum (0, 3, 6 hours)  Result Value Ref Range   Troponin I <0.03 <0.03 ng/mL  CBC  Result Value Ref Range   WBC 7.8 4.0 - 10.5 K/uL   RBC 4.80 3.87 - 5.11 MIL/uL   Hemoglobin 12.9 12.0 - 15.0 g/dL   HCT 39.5 36.0 - 46.0 %   MCV 82.3 78.0 - 100.0 fL   MCH 26.9 26.0 - 34.0 pg   MCHC 32.7 30.0 - 36.0 g/dL   RDW 13.5 11.5 - 15.5 %   Platelets 231 150 - 400 K/uL  Creatinine, serum  Result Value Ref Range   Creatinine, Ser 0.72 0.44 - 1.00 mg/dL   GFR calc non Af Amer >60 >60 mL/min   GFR calc Af Amer >60 >60 mL/min  Hemoglobin A1c  Result Value Ref Range   Hgb A1c MFr Bld 4.9 4.8 - 5.6 %   Mean Plasma Glucose 94 mg/dL  Lipid panel  Result Value Ref Range   Cholesterol 256 (H) 0 - 200 mg/dL   Triglycerides 133 <150 mg/dL   HDL 62 >40 mg/dL   Total CHOL/HDL Ratio 4.1 RATIO   VLDL 27 0 - 40 mg/dL   LDL Cholesterol 167 (H) 0 - 99 mg/dL  I-stat troponin, ED  Result Value Ref Range   Troponin i, poc 0.01 0.00 - 0.08 ng/mL   Comment 3              Assessment & Plan:   Problem List Items Addressed This Visit    Chest pain - Primary    Recent episode largely resolved. She has persistent chest muscle soreness - ?pectoralis strain. Discussed ongoing care - heating pad, NSAID prn, gentle stretching.  I did encourage she keep appointment with cardiologist. Update if not improving with treatment.       HTN (hypertension)    Losartan and hctz were started. I  asked her to return in 1 wk for lab visit only to check Cr/K. Continue current meds at this time.       Hyperlipidemia    crestor was started in hospital. Recent ASCVD 10 yr risk was 1.8%. rec continued crestor given fmhx and recent episode. She has f/u with cards scheduled for later this month       Obesity, Class II, BMI 35-39.9, no comorbidity    Congratulated on healthy diet and exercise changes to date.          Follow up plan: No Follow-up on file.  [name], MD

## 2017-01-07 NOTE — Assessment & Plan Note (Signed)
crestor was started in hospital. Recent ASCVD 10 yr risk was 1.8%. rec continued crestor given fmhx and recent episode. She has f/u with cards scheduled for later this month

## 2017-01-07 NOTE — Assessment & Plan Note (Signed)
Congratulated on healthy diet and exercise changes to date.

## 2017-01-07 NOTE — Addendum Note (Signed)
Addended by: [name] on: 01/07/2017 02:46 PM   Modules accepted: Orders

## 2017-01-07 NOTE — Assessment & Plan Note (Signed)
Recent episode largely resolved. She has persistent chest muscle soreness - ?pectoralis strain. Discussed ongoing care - heating pad, NSAID prn, gentle stretching.  I did encourage she keep appointment with cardiologist. Update if not improving with treatment.

## 2017-01-07 NOTE — Patient Instructions (Addendum)
Possible pectoralis muscle strain - use heating pad, gentle stretching, and continue ibuprofen as needed.  Keep appointment with cardiology.  Continue current medicines.  Return in 1 week for labs (potassium and kidneys).  We will need cholesterol levels rechecked in 2-3 months.

## 2017-01-10 ENCOUNTER — Ambulatory Visit: Payer: BLUE CROSS/BLUE SHIELD | Admitting: Internal Medicine

## 2017-01-12 ENCOUNTER — Ambulatory Visit (HOSPITAL_COMMUNITY)
Admission: RE | Admit: 2017-01-12 | Discharge: 2017-01-12 | Disposition: A | Discharge status: Home / Self Care | Payer: BLUE CROSS/BLUE SHIELD | Source: Ambulatory Visit | Attending: Internal Medicine | Admitting: Internal Medicine

## 2017-01-12 ENCOUNTER — Encounter (HOSPITAL_COMMUNITY): Payer: Self-pay

## 2017-01-12 ENCOUNTER — Other Ambulatory Visit (HOSPITAL_BASED_OUTPATIENT_CLINIC_OR_DEPARTMENT_OTHER): Payer: BLUE CROSS/BLUE SHIELD

## 2017-01-12 DIAGNOSIS — Z862 Personal history of diseases of the blood and blood-forming organs and certain disorders involving the immune mechanism: Secondary | ICD-10-CM | POA: Diagnosis not present

## 2017-01-12 DIAGNOSIS — C3490 Malignant neoplasm of unspecified part of unspecified bronchus or lung: Secondary | ICD-10-CM | POA: Diagnosis not present

## 2017-01-12 DIAGNOSIS — C3411 Malignant neoplasm of upper lobe, right bronchus or lung: Secondary | ICD-10-CM

## 2017-01-12 DIAGNOSIS — R918 Other nonspecific abnormal finding of lung field: Secondary | ICD-10-CM | POA: Insufficient documentation

## 2017-01-12 LAB — COMPREHENSIVE METABOLIC PANEL
ALBUMIN: 4.1 g/dL (ref 3.5–5.0)
ALT: 17 U/L (ref 0–55)
AST: 16 U/L (ref 5–34)
Alkaline Phosphatase: 79 U/L (ref 40–150)
Anion Gap: 9 mEq/L (ref 3–11)
BILIRUBIN TOTAL: 2.07 mg/dL — AB (ref 0.20–1.20)
BUN: 21.7 mg/dL (ref 7.0–26.0)
CALCIUM: 10.1 mg/dL (ref 8.4–10.4)
CO2: 31 mEq/L — ABNORMAL HIGH (ref 22–29)
Chloride: 103 mEq/L (ref 98–109)
Creatinine: 0.8 mg/dL (ref 0.6–1.1)
EGFR: 80 mL/min/{1.73_m2} — AB (ref >90)
Glucose: 92 mg/dl (ref 70–140)
POTASSIUM: 4.9 meq/L (ref 3.5–5.1)
Sodium: 143 mEq/L (ref 136–145)
TOTAL PROTEIN: 7.5 g/dL (ref 6.4–8.3)

## 2017-01-12 LAB — CBC WITH DIFFERENTIAL/PLATELET
BASO%: 0.6 % (ref 0.0–2.0)
BASOS ABS: 0.1 10*3/uL (ref 0.0–0.1)
EOS ABS: 0.5 10*3/uL (ref 0.0–0.5)
EOS%: 5.7 % (ref 0.0–7.0)
HEMATOCRIT: 39.2 % (ref 34.8–46.6)
HEMOGLOBIN: 13.1 g/dL (ref 11.6–15.9)
LYMPH#: 1.9 10*3/uL (ref 0.9–3.3)
LYMPH%: 22.6 % (ref 14.0–49.7)
MCH: 27.4 pg (ref 25.1–34.0)
MCHC: 33.4 g/dL (ref 31.5–36.0)
MCV: 82 fL (ref 79.5–101.0)
MONO#: 0.5 10*3/uL (ref 0.1–0.9)
MONO%: 6.1 % (ref 0.0–14.0)
NEUT#: 5.4 10*3/uL (ref 1.5–6.5)
NEUT%: 65 % (ref 38.4–76.8)
Platelets: 245 10*3/uL (ref 145–400)
RBC: 4.78 10*6/uL (ref 3.70–5.45)
RDW: 13.3 % (ref 11.2–14.5)
WBC: 8.3 10*3/uL (ref 3.9–10.3)

## 2017-01-12 MED ORDER — IOPAMIDOL (ISOVUE-300) INJECTION 61%
INTRAVENOUS | Status: AC
  Filled 2017-01-12: qty 75

## 2017-01-12 MED ORDER — IOPAMIDOL (ISOVUE-300) INJECTION 61%
75.0000 mL | Freq: Once | INTRAVENOUS | Status: AC | PRN
  Administered 2017-01-12: 75 mL via INTRAVENOUS

## 2017-01-14 ENCOUNTER — Other Ambulatory Visit: Payer: BLUE CROSS/BLUE SHIELD

## 2017-01-19 ENCOUNTER — Encounter: Payer: Self-pay | Admitting: Internal Medicine

## 2017-01-19 ENCOUNTER — Ambulatory Visit (HOSPITAL_BASED_OUTPATIENT_CLINIC_OR_DEPARTMENT_OTHER): Payer: BLUE CROSS/BLUE SHIELD | Admitting: Internal Medicine

## 2017-01-19 ENCOUNTER — Telehealth: Payer: Self-pay | Admitting: Internal Medicine

## 2017-01-19 VITALS — BP 111/77 | HR 109 | Temp 98.2°F | Resp 20 | Ht 62.0 in | Wt 197.7 lb

## 2017-01-19 DIAGNOSIS — R911 Solitary pulmonary nodule: Secondary | ICD-10-CM | POA: Diagnosis not present

## 2017-01-19 DIAGNOSIS — C3411 Malignant neoplasm of upper lobe, right bronchus or lung: Secondary | ICD-10-CM | POA: Diagnosis not present

## 2017-01-19 HISTORY — DX: Other disorders of bilirubin metabolism: E80.6

## 2017-01-19 NOTE — Progress Notes (Signed)
[facility] Telephone:([phone]) [phone]   [phone]  OFFICE PROGRESS NOTE  [name], MD [place] [address]  DIAGNOSIS: Stage IA (T1a, N0, M0) non-small cell lung cancer, adenocarcinoma presented with right upper lobe pulmonary nodule diagnosed in November 2017.  PRIOR THERAPY:status post right upper lobectomy with lymph node dissection in November 2017 under the care of Dr. Roxan Hockey.  CURRENT THERAPY: Observation.  INTERVAL HISTORY: Monica [name] 51 y.o. female returns to the clinic today for follow-up visit. The patient is feeling fine today with no specific complaints. She was seen recently at the hospital for chest pain and she had cardiac workup performed with no concerning findings. She denied having any current chest pain, shortness of breath, cough or hemoptysis. She denied having any fever or chills. She has no nausea, vomiting, diarrhea or constipation. She is currently on observation and she had repeat CT scan of the chest performed recently and she is here for evaluation and discussion of her scan results.  MEDICAL HISTORY: Past Medical History:  Diagnosis Date  . Chest pain   . History of chicken pox   . History of hypertension   . Hypothyroidism   . Primary adenocarcinoma of upper lobe of right lung ([place]) 05/28/2016   RUL apex spiculated ~2cm nodule concerning by PET scan - referred to multidisciplinary thoracic oncology clinic S/p thoracoscopic RULobectomy Roxan Hockey) 06/2016  . Sarcoidosis, lung ([place]) ~2004   treated with prendisone and stable since then    ALLERGIES:  has No Known Allergies.  MEDICATIONS:  Current Outpatient Prescriptions  Medication Sig Dispense Refill  . albuterol (PROVENTIL HFA;VENTOLIN HFA) 108 (90 Base) MCG/ACT inhaler Inhale 2 puffs into the lungs every 6 (six) hours as needed for wheezing or shortness of breath. 1 Inhaler 2  . aspirin 81 MG tablet Take 1 tablet (81 mg  total) by mouth daily. 30 tablet 0  . Cholecalciferol (VITAMIN D3) 1000 units CAPS Take 2 capsules (2,000 Units total) by mouth daily. (Patient taking differently: Take 2,000 Units by mouth at bedtime. ) 30 capsule   . fluticasone (FLONASE) 50 MCG/ACT nasal spray Place 1 spray into both nostrils daily as needed for allergies. 16 g 0  . hydrochlorothiazide (HYDRODIURIL) 25 MG tablet Take 1 tablet (25 mg total) by mouth daily. 30 tablet 0  . ibuprofen (ADVIL,MOTRIN) 200 MG tablet Take 400 mg by mouth every 6 (six) hours as needed for mild pain.     Marland Kitchen levothyroxine (SYNTHROID, LEVOTHROID) 150 MCG tablet Take 1 tablet (150 mcg total) by mouth daily before breakfast. (Patient taking differently: Take 75 mcg by mouth daily before breakfast. ) 30 tablet 11  . losartan (COZAAR) 25 MG tablet Take 1 tablet (25 mg total) by mouth daily. 30 tablet 0  . pantoprazole (PROTONIX) 40 MG tablet Take 1 tablet (40 mg total) by mouth daily. (Patient taking differently: Take 40 mg by mouth at bedtime. ) 30 tablet 3  . rosuvastatin (CRESTOR) 10 MG tablet Take 1 tablet (10 mg total) by mouth daily at 6 PM. 30 tablet 0  . valACYclovir (VALTREX) 1000 MG tablet Take 2 tablets (2,000 mg total) by mouth 2 (two) times daily. For 1 day (Patient taking differently: Take 2,000 mg by mouth 2 (two) times daily as needed (fever blisters). For 1 day) 12 tablet 3   No current facility-administered medications for this visit.     SURGICAL HISTORY:  Past Surgical History:  Procedure Laterality Date  .  CARDIOVASCULAR STRESS TEST  12/2016   Nuclear CP stress test: hypertensive response to exercise, normal low risk study, EF 68%  . COLONOSCOPY  09/2015   WNL [name])  . ENDOMETRIAL ABLATION W/ NOVASURE  01/2009   Archie Endo 12/02/2010  . LOBECTOMY Right 06/14/2016   RU Lobectomy; Surgeon: Melrose Nakayama, MD  . LYMPH NODE DISSECTION Right 06/14/2016   RIGHT UPPER LOBE LYMPH NODE DISSECTION;  Surgeon: Melrose Nakayama, MD  .  [place] Bilateral 1998  . TUBAL LIGATION  2001  . VIDEO ASSISTED THORACOSCOPY (VATS)/WEDGE RESECTION Right 06/14/2016   Surgeon: Melrose Nakayama, MD    REVIEW OF SYSTEMS:  A comprehensive review of systems was negative.   PHYSICAL EXAMINATION: General appearance: alert, cooperative and no distress Head: Normocephalic, without obvious abnormality, atraumatic Neck: no adenopathy, no JVD, supple, symmetrical, trachea midline and thyroid not enlarged, symmetric, no tenderness/mass/nodules Lymph nodes: Cervical, supraclavicular, and axillary nodes normal. Resp: clear to auscultation bilaterally Back: symmetric, no curvature. ROM normal. No CVA tenderness. Cardio: regular rate and rhythm, S1, S2 normal, no murmur, click, rub or gallop GI: soft, non-tender; bowel sounds normal; no masses,  no organomegaly Extremities: extremities normal, atraumatic, no cyanosis or edema  ECOG PERFORMANCE STATUS: 0 - Asymptomatic  Blood pressure 111/77, pulse (!) 109, temperature 98.2 F (36.8 C), temperature source Oral, resp. rate 20, height 5\' 2"  (1.575 m), weight 197 lb 11.2 oz (89.7 kg), SpO2 100 %.  LABORATORY DATA: Lab Results  Component Value Date   WBC 8.3 01/12/2017   HGB 13.1 01/12/2017   HCT 39.2 01/12/2017   MCV 82.0 01/12/2017   PLT 245 01/12/2017      Chemistry      Component Value Date/Time   NA 143 01/12/2017 1105   K 4.9 01/12/2017 1105   CL 104 12/30/2016 0832   CO2 31 (H) 01/12/2017 1105   BUN 21.7 01/12/2017 1105   CREATININE 0.8 01/12/2017 1105      Component Value Date/Time   CALCIUM 10.1 01/12/2017 1105   ALKPHOS 79 01/12/2017 1105   AST 16 01/12/2017 1105   ALT 17 01/12/2017 1105   BILITOT 2.07 (H) 01/12/2017 1105       RADIOGRAPHIC STUDIES: Dg Chest 2 View  Result Date: 12/30/2016 CLINICAL DATA:  History of lung carcinoma and sarcoidosis. Chest pain. EXAM: CHEST  2 VIEW COMPARISON:  September 21, 2016 FINDINGS: There is postoperative change on  the right with areas of volume loss, stable. There is no edema or consolidation. Heart size and pulmonary vascularity are normal. No evident adenopathy. No appreciable bone lesions. IMPRESSION: Stable appearance compared to prior study. Postoperative change on the right with volume loss. No edema or consolidation. No mass or adenopathy evident. Stable cardiac silhouette. Electronically Signed   By: Lowella Grip III M.D.   On: 12/30/2016 08:54   Ct Chest W Contrast  Result Date: 01/12/2017 CLINICAL DATA:  Restaging lung cancer. History of adenocarcinoma of the right upper lobe status post right upper lobe lobectomy. EXAM: CT CHEST WITH CONTRAST TECHNIQUE: Multidetector CT imaging of the chest was performed during intravenous contrast administration. CONTRAST:  64mL ISOVUE-300 IOPAMIDOL (ISOVUE-300) INJECTION 61% COMPARISON:  Chest CT 06/03/2016 and PET-CT 06/07/2016 FINDINGS: Cardiovascular: The heart is normal in size. No pericardial effusion. The aorta is normal in caliber. No dissection. No significant atherosclerotic calcifications. There are few scattered coronary artery calcifications suspected. Mediastinum/Nodes: No mediastinal or hilar mass or lymphadenopathy. 10 mm right hilar lymph node is stable. A 6 mm  left hilar lymph node is stable. The esophagus is grossly normal. Lungs/Pleura: Stable surgical changes from a right upper lobe lobectomy. No findings for recurrent tumor. Small cavitary lesion noted in the right lung apex on image number 32 which measures 5 mm. Recommend attention on future scans. Small nodular lesion at the left lung apex on image number 23 measures 4 mm and appears stable. Sub 3 mm subpleural nodule in the left upper lobe on image number 43 appears stable. 4 mm superior segment left lower lobe pulmonary nodule on image number 60 is stable. Other small scattered subpleural nodules are unchanged. No new pulmonary nodules. Upper Abdomen: No significant upper abdominal findings. No  focal hepatic or adrenal gland lesions. Chest wall/ Musculoskeletal: No breast masses, supraclavicular or axillary lymphadenopathy. Small scattered lymph nodes are stable. The thyroid gland appears normal. No significant bony findings. IMPRESSION: 1. Status post right upper lobe lobectomy. No findings for recurrent tumor or metastatic disease. 2. Small bilateral pulmonary nodules as discussed above. Recommend followup chest CT in November 2018 which could be a 1 year followup from the original chest CT. 3. No acute pulmonary findings. 4. No significant upper abdominal findings. Electronically Signed   By: Marijo Sanes M.D.   On: 01/12/2017 14:38   Nm Myocar Multi W/spect W/wall Motion / Ef  Result Date: 12/31/2016  Blood pressure demonstrated a hypertensive response to exercise.  Upsloping ST segment depression ST segment depression of 2 mm was noted during stress in the II, III, I, V4, V5, V6 and V3 leads, beginning at 2 minutes of stress, and returning to baseline after 1-5 minutes of recovery.  The study is normal.  This is a low risk study.  Nuclear stress EF: 68%.  Low risk stress nuclear study with normal perfusion and normal left ventricular regional and global systolic function.    ASSESSMENT AND PLAN: This is a very pleasant 51 years old white female with stage IA non-small cell lung cancer, adenocarcinoma presented with right upper lobe pulmonary nodule is status post right upper lobectomy with lymph node dissection in November 2017 under the care of Dr. Roxan Hockey. The patient is currently on observation and the recent CT scan of the chest showed no clear evidence for disease recurrence. She continues to have small bilateral pulmonary nodules that need close observation on upcoming imaging studies. I will see the patient back for follow-up visit in 6 months for reevaluation and repeat CT scan of the chest for restaging of her disease. She was advised to call immediately if she has any  concerning symptoms in the interval. The patient voices understanding of current disease status and treatment options and is in agreement with the current care plan. All questions were answered. The patient knows to call the clinic with any problems, questions or concerns. We can certainly see the patient much sooner if necessary.  I spent 10 minutes counseling the patient face to face. The total time spent in the appointment was 15 minutes.  Disclaimer: This note was dictated with voice recognition software. Similar sounding words can inadvertently be transcribed and may not be corrected upon review.

## 2017-01-19 NOTE — Telephone Encounter (Signed)
Scheduled appt per 6/20 los  . [facility] to contact patient with Ct schedule.

## 2017-01-25 ENCOUNTER — Encounter: Payer: Self-pay | Admitting: Physician Assistant

## 2017-01-25 NOTE — Progress Notes (Addendum)
Cardiology Office Note    Date:  01/26/2017  ID:  Monica [name], DOB 17-Nov-1965, MRN [id number] PCP:  Eustaquio Boyden, MD  Cardiologist:  Dr.Nelson   Chief Complaint: f/u CP  History of Present Illness:  Monica [name] is a 51 y.o. female with history of HTN, hypothyroidism, HLD, sarcoidosis, hyperbilirubinemia and adenocarcinoma of R Lung s/p RU lobectomy who presents for post-hospital follow-up. She had been on BP meds in the past but this improved and she was taken off. She was admitted 11/2016 with sudden centralized sharp chest pain and diaphporesis. Troponins were negative. Labs otherwise showed normal CBC, LDL 167. Most recent CMET per oncology 12/2016 showed K 4.9, Cr 0.8. Stress test 12/31/16 was low risk, normal perfusion, EF 68%. Consideration given to OP echo to assess LVH and wall motion abnormalities. HCTZ and Crestor were added. Seen by onc 01/12/17 with stable CT chest, no findings for recurrent tumor or metastatic disease; small bilateral pulm nodules, no mention of CA calcium.  She presents back for follow-up feeling well. She denies any recurrent chest pain. She does notice some left upper posterior shoulder pain occasionally described as a pulling sensation that seems worse laying on her left side. This is not worse with exertion, inspiration or palpation. It is not worse with certain movements. She has noticed mild DOE for the past several months but states she has been very inactive and only recently got back into doing a 3 mile walk exercise tape. She is beginning to feel better the more she does it. She is not hypoxic, tachycardic, tachypneic.    Past Medical History:  Diagnosis Date  . Essential hypertension   . History of chicken pox   . Hyperbilirubinemia 01/19/2017  . Hyperlipidemia   . Hypothyroidism   . Primary adenocarcinoma of upper lobe of right lung (HCC) 05/28/2016   RUL apex spiculated ~2cm nodule concerning by PET scan - referred to multidisciplinary  thoracic oncology clinic S/p thoracoscopic RULobectomy Dorris Fetch) 06/2016  . Sarcoidosis, lung (HCC) ~2004   treated with prendisone and stable since then    Past Surgical History:  Procedure Laterality Date  . CARDIOVASCULAR STRESS TEST  12/2016   Nuclear CP stress test: hypertensive response to exercise, normal low risk study, EF 68%  . COLONOSCOPY  09/2015   WNL Randa Evens)  . ENDOMETRIAL ABLATION W/ NOVASURE  01/2009   Hattie Perch 12/02/2010  . LOBECTOMY Right 06/14/2016   RU Lobectomy; Surgeon: Loreli Slot, MD  . LYMPH NODE DISSECTION Right 06/14/2016   RIGHT UPPER LOBE LYMPH NODE DISSECTION;  Surgeon: Loreli Slot, MD  . REFRACTIVE SURGERY Bilateral 1998  . TUBAL LIGATION  2001  . VIDEO ASSISTED THORACOSCOPY (VATS)/WEDGE RESECTION Right 06/14/2016   Surgeon: Loreli Slot, MD    Current Medications: Current Meds  Medication Sig  . albuterol (PROVENTIL HFA;VENTOLIN HFA) 108 (90 Base) MCG/ACT inhaler Inhale 2 puffs into the lungs every 6 (six) hours as needed for wheezing or shortness of breath.  Marland Kitchen aspirin 81 MG tablet Take 1 tablet (81 mg total) by mouth daily.  . Cholecalciferol (VITAMIN D-3) 1000 units CAPS Take 1,000 Units by mouth at bedtime.  . fluticasone (FLONASE) 50 MCG/ACT nasal spray Place 1 spray into both nostrils daily as needed for allergies.  . hydrochlorothiazide (HYDRODIURIL) 25 MG tablet Take 1 tablet (25 mg total) by mouth daily.  Marland Kitchen ibuprofen (ADVIL,MOTRIN) 200 MG tablet Take 400 mg by mouth every 6 (six) hours as needed for mild pain.   Marland Kitchen  levothyroxine (SYNTHROID, LEVOTHROID) 150 MCG tablet Take 75 mcg by mouth daily before breakfast.  . losartan (COZAAR) 25 MG tablet Take 1 tablet (25 mg total) by mouth daily.  . pantoprazole (PROTONIX) 40 MG tablet Take 40 mg by mouth at bedtime.  . rosuvastatin (CRESTOR) 10 MG tablet Take 1 tablet (10 mg total) by mouth daily at 6 PM.  . valACYclovir (VALTREX) 1000 MG tablet Take 1,000 mg by mouth 2  (two) times daily as needed (fever blisters).  . [DISCONTINUED] hydrochlorothiazide (HYDRODIURIL) 25 MG tablet Take 1 tablet (25 mg total) by mouth daily.  . [DISCONTINUED] losartan (COZAAR) 25 MG tablet Take 1 tablet (25 mg total) by mouth daily.  . [DISCONTINUED] rosuvastatin (CRESTOR) 10 MG tablet Take 1 tablet (10 mg total) by mouth daily at 6 PM.     Allergies:   Patient has no known allergies.   Social History   Social History  . Marital status: Married    Spouse name: N/A  . Number of children: N/A  . Years of education: N/A   Social History Main Topics  . Smoking status: Never Smoker  . Smokeless tobacco: Never Used  . Alcohol use No  . Drug use: No  . Sexual activity: Not Currently   Other Topics Concern  . 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 Cancer Mother 50       breast with met to bone  . Hypertension Mother   . Cancer Father 61       prostate  . CAD Maternal Grandmother        MI  . Stroke Maternal Grandmother   . Stroke Paternal Grandmother   . CAD Paternal Grandmother   . Diabetes Paternal Grandfather     ROS:   Please see the history of present illness.  All other systems are reviewed and otherwise negative.    PHYSICAL EXAM:   VS:  BP 106/66   Pulse 94   Ht '5\' 2"'$  (1.575 m)   Wt 198 lb 12.8 oz (90.2 kg)   SpO2 98%   BMI 36.36 kg/m   BMI: Body mass index is 36.36 kg/m. GEN: Well nourished, well developed WF, in no acute distress  HEENT: normocephalic, atraumatic Neck: no JVD, carotid bruits, or masses Cardiac: RRR; no murmurs, rubs, or gallops, no edema  Respiratory:  clear to auscultation bilaterally, normal work of breathing GI: soft, nontender, nondistended, + BS MS: no deformity or  atrophy  Skin: warm and dry, mild red dotted rash on LE ? Dry skin - not itchy, no suppuration, no other locations Neuro:  Alert and Oriented x 3, Strength and sensation are intact, follows commands Psych: euthymic mood, full affect  Wt Readings from Last 3 Encounters:  01/26/17 198 lb 12.8 oz (90.2 kg)  01/19/17 197 lb 11.2 oz (89.7 kg)  01/07/17 195 lb (88.5 kg)      Studies/Labs Reviewed:   EKG:  EKG was not ordered today. Reviewed from hospital - nonacute.  Recent Labs: 11/12/2016: TSH 1.98 01/12/2017: ALT 17; BUN 21.7; Creatinine 0.8; HGB 13.1; Platelets 245; Potassium 4.9; Sodium 143   Lipid Panel    Component Value Date/Time   CHOL 256 (H) 12/30/2016 1124  TRIG 133 12/30/2016 1124   HDL 62 12/30/2016 1124   CHOLHDL 4.1 12/30/2016 1124   VLDL 27 12/30/2016 1124   LDLCALC 167 (H) 12/30/2016 1124    Additional studies/ records that were reviewed today include: Summarized above.    ASSESSMENT & PLAN:   1. Atypical chest pain - she denies further chest pain, but has had some atypical type shoulder pain which sounds somewhat like nerve impingement or musculoskeletal - worse when laying on her side. May need further orthopedic eval but will defer to primary care. She has been taken off Advil by oncology pending further eval of bilirubin per PCP so would hold off NSAIDS at this time. Nuclear stress test was low risk with normal LV function. Would continue to observe for any progressive symptoms for now. Continue primary prevention measures. Discussed importance of lifestyle modifications. She seems motivated to continue the exercise program she recently started - we discussed notifying our office if dyspnea does not continue to improve. See below re: echo. Of note she is not hypoxic, tachycardic, or tachypneic and has no signs of DVT on exam. 2. Essential HTN - much improved. Discussed echocardiogram with patient. Initially we did not feel inclined since she feels improved but  upon further review of chart, Dr. Meda Coffee suggested this to evaluate for wall motion abnormalities. I will arrange. She has very minimal red spotting to her lower extremities that she herself had not even noticed - question razor burn versus dry skin, less likely med reaction given that it is not diffuse nor itchy. I asked her to monitor for now and review with primary care if it does not improve or worsens. 3. Hyperlipidemia - continue Crestor at low dose for now - discussed oncology recommendation for her to touch base with PCP to review her bilirubin levels and further workup. Recent LFTs otherwise were unremarkable. Since she does not necessarily need long-term cardiology follow-up at present time, I think it makes more sense for her lipids and LFTs to be followed by primary care. Will refill Crestor for now as she is almost out but asked her to touch base with PCP to discuss further plan for monitoring. 4. Hyperbilirubinemia - appears chronic even before Crestor. See above.  Disposition: F/u with Dr. Meda Coffee in 6 months.   Medication Adjustments/Labs and Tests Ordered: Current medicines are reviewed at length with the patient today.  Concerns regarding medicines are outlined above. Medication changes, Labs and Tests ordered today are summarized above and listed in the Patient Instructions accessible in Encounters.   Signed, Charlie Pitter, PA-C  01/26/2017 3:13 PM    [facility] [address], [place], [place]  [address] Phone: [phone]; Fax: [phone]

## 2017-01-26 ENCOUNTER — Ambulatory Visit (INDEPENDENT_AMBULATORY_CARE_PROVIDER_SITE_OTHER): Payer: BLUE CROSS/BLUE SHIELD | Admitting: Physician Assistant

## 2017-01-26 ENCOUNTER — Ambulatory Visit: Payer: BLUE CROSS/BLUE SHIELD | Admitting: Physician Assistant

## 2017-01-26 ENCOUNTER — Encounter: Payer: Self-pay | Admitting: Physician Assistant

## 2017-01-26 ENCOUNTER — Telehealth: Payer: Self-pay | Admitting: Physician Assistant

## 2017-01-26 VITALS — BP 106/66 | HR 94 | Ht 62.0 in | Wt 198.8 lb

## 2017-01-26 DIAGNOSIS — R079 Chest pain, unspecified: Secondary | ICD-10-CM

## 2017-01-26 DIAGNOSIS — I10 Essential (primary) hypertension: Secondary | ICD-10-CM | POA: Diagnosis not present

## 2017-01-26 DIAGNOSIS — E785 Hyperlipidemia, unspecified: Secondary | ICD-10-CM | POA: Diagnosis not present

## 2017-01-26 DIAGNOSIS — R0789 Other chest pain: Secondary | ICD-10-CM | POA: Diagnosis not present

## 2017-01-26 MED ORDER — HYDROCHLOROTHIAZIDE 25 MG PO TABS: 25.0000 mg | ORAL_TABLET | Freq: Every day | ORAL | 2 refills | Status: DC

## 2017-01-26 MED ORDER — LOSARTAN POTASSIUM 25 MG PO TABS: 25.0000 mg | ORAL_TABLET | Freq: Every day | ORAL | 2 refills | Status: DC

## 2017-01-26 MED ORDER — ROSUVASTATIN CALCIUM 10 MG PO TABS: 10.0000 mg | ORAL_TABLET | Freq: Every day | ORAL | 1 refills | Status: DC

## 2017-01-26 NOTE — Telephone Encounter (Signed)
Please call patient - I dug deeper into her chart after she left and noticed Dr. Meda Coffee had made mention that she would be interested in seeing echo result for LV wall motion, not just the LV thickness. Therefore I would recommend we go ahead and get the echo just to have a comprehensive assessment to make sure there are no abnormalities so we can lay the issue of her recent pains to rest from a cardiac standpoint.  Myrth Dahan PA-C

## 2017-01-26 NOTE — Patient Instructions (Signed)
Medication Instructions:  Your physician recommends that you continue on your current medications as directed. Please refer to the Current Medication list given to you today.   Labwork: None ordered  Testing/Procedures: None ordered  Follow-Up: Your physician wants you to follow-up in: [place].   You will receive a reminder letter in the mail two months in advance. If you don't receive a letter, please call our office to schedule the follow-up appointment.   Any Other Special Instructions Will Be Listed Below (If Applicable).     If you need a refill on your cardiac medications before your next appointment, please call your pharmacy.

## 2017-02-09 ENCOUNTER — Other Ambulatory Visit: Payer: Self-pay

## 2017-02-09 ENCOUNTER — Ambulatory Visit (HOSPITAL_COMMUNITY): Payer: BLUE CROSS/BLUE SHIELD | Attending: Cardiovascular Disease

## 2017-02-09 ENCOUNTER — Encounter (INDEPENDENT_AMBULATORY_CARE_PROVIDER_SITE_OTHER): Payer: Self-pay

## 2017-02-09 DIAGNOSIS — R079 Chest pain, unspecified: Secondary | ICD-10-CM | POA: Diagnosis not present

## 2017-02-09 DIAGNOSIS — I071 Rheumatic tricuspid insufficiency: Secondary | ICD-10-CM | POA: Diagnosis not present

## 2017-02-11 ENCOUNTER — Telehealth: Payer: Self-pay | Admitting: Physician Assistant

## 2017-02-11 NOTE — Telephone Encounter (Signed)
New message   Pt calling back Monica [name] She states to not leave a VM with the information that if she does not pick up call back next week.

## 2017-02-11 NOTE — Telephone Encounter (Signed)
Called, spoke with pt. Informed recent echo result from [facility], [place]. Reviewed all recommendations - long term control BP and weight, low sodium diet (2000 mg daily). Gave pt information from BMI chart r/t healthy weight for sex and height. Informed stay hydrated, but not > 2 liters a day. Pt teaching - informed about 4 cups in 1 liter. Pt stated she typically has 2-3 16 oz bottle of water a day. Pt verbalized understanding.

## 2017-03-23 ENCOUNTER — Telehealth: Payer: Self-pay | Admitting: Internal Medicine

## 2017-03-23 NOTE — Telephone Encounter (Signed)
Spoke to patient's husband regarding 12/26 appointment.

## 2017-03-30 ENCOUNTER — Encounter: Payer: Self-pay | Admitting: Family Medicine

## 2017-03-30 ENCOUNTER — Ambulatory Visit (INDEPENDENT_AMBULATORY_CARE_PROVIDER_SITE_OTHER): Payer: BLUE CROSS/BLUE SHIELD | Admitting: Family Medicine

## 2017-03-30 VITALS — BP 122/78 | HR 98 | Temp 98.0°F | Wt 200.5 lb

## 2017-03-30 DIAGNOSIS — E669 Obesity, unspecified: Secondary | ICD-10-CM

## 2017-03-30 DIAGNOSIS — R053 Chronic cough: Secondary | ICD-10-CM

## 2017-03-30 DIAGNOSIS — I10 Essential (primary) hypertension: Secondary | ICD-10-CM | POA: Diagnosis not present

## 2017-03-30 DIAGNOSIS — F458 Other somatoform disorders: Secondary | ICD-10-CM | POA: Diagnosis not present

## 2017-03-30 DIAGNOSIS — E785 Hyperlipidemia, unspecified: Secondary | ICD-10-CM

## 2017-03-30 DIAGNOSIS — E039 Hypothyroidism, unspecified: Secondary | ICD-10-CM

## 2017-03-30 DIAGNOSIS — R0989 Other specified symptoms and signs involving the circulatory and respiratory systems: Secondary | ICD-10-CM

## 2017-03-30 DIAGNOSIS — R198 Other specified symptoms and signs involving the digestive system and abdomen: Secondary | ICD-10-CM

## 2017-03-30 DIAGNOSIS — R05 Cough: Secondary | ICD-10-CM

## 2017-03-30 NOTE — Assessment & Plan Note (Signed)
Chronic, stable. Continue current regimen. 

## 2017-03-30 NOTE — Assessment & Plan Note (Signed)
Last LDL 167. She has been on crestor for last several months. No strong fmhx premature CAD. Low 10 yr risk. Suggested she stop crestor and aspirin and will recheck [place] next fasting labwork.

## 2017-03-30 NOTE — Assessment & Plan Note (Signed)
Recent TSH was stable.

## 2017-03-30 NOTE — Assessment & Plan Note (Signed)
Discussed tapering schedule for protonix.

## 2017-03-30 NOTE — Assessment & Plan Note (Signed)
Discussed tapering off of protonix.

## 2017-03-30 NOTE — Assessment & Plan Note (Signed)
Pt considering weight watchers.

## 2017-03-30 NOTE — Patient Instructions (Addendum)
We will monitor bilirubin levels.  Let's try to taper down on protonix to find least amount of medicine needed.  Stop crestor when you run out.  Ok to stop aspirin daily.  We will recheck cholesterol levels at your next physical in 8 months

## 2017-03-30 NOTE — Assessment & Plan Note (Signed)
Precedes statin. Possible Gilbert disease. Will continue to monitor.

## 2017-03-30 NOTE — Progress Notes (Signed)
BP 122/78   Pulse 98   Temp 98 F (36.7 C) (Oral)   Wt 200 lb 8 oz (90.9 kg)   SpO2 98%   BMI 36.67 kg/m    CC: f/u visit Subjective:    Patient ID: Monica [name], female    DOB: 02-18-1966, 51 y.o.   MRN: [id number]  HPI: KELLEIGH SKERRITT is a 51 y.o. female presenting on 03/30/2017 for Follow-up (cardiologist) and Labs Only (billirubin per oncologist )   Recent hospitalization 11/2016 with sharp central chest pain and sweating, evaluation including stress test unrevealing. Over last few months noticing worsening L shoulder pain. This has resolved.   Recent echocardiogram was reassuring.   HLD - rec PCP follow this. She is on crestor '10mg'$  daily. Last LDL was 1667 (11/2016). Very low cardiac risk but family history is present (grandparents). History of mild hyperbilirubinemia.  The 10-year ASCVD risk score Mikey Bussing [name]., et al., 2013) is: 2.2%   Values used to calculate the score:     Age: 47 years     Sex: Female     Is Non-Hispanic African American: No     Diabetic: No     Tobacco smoker: No     Systolic Blood Pressure: 998 mmHg     Is BP treated: Yes     HDL Cholesterol: 62 mg/dL     Total Cholesterol: 256 mg/dL   The CVD Risk score (D'Agostino, et al., 2008) failed to calculate for the following reasons:   CVD risk score not calculated   Globus sensation with dry cough - treated with protonix '40mg'$  daily.   Obesity - weight gain noted. She is considering restarting weight watchers.   Noticing bowel movement changes - looser more frequent stools.   Relevant past medical, surgical, family and social history reviewed and updated as indicated. Interim medical history since our last visit reviewed. Allergies and medications reviewed and updated. Outpatient Medications Prior to Visit  Medication Sig Dispense Refill  . albuterol (PROVENTIL HFA;VENTOLIN HFA) 108 (90 Base) MCG/ACT inhaler Inhale 2 puffs into the lungs every 6 (six) hours as needed for wheezing or  shortness of breath. 1 Inhaler 2  . Cholecalciferol (VITAMIN D-3) 1000 units CAPS Take 1,000 Units by mouth at bedtime.    . fluticasone (FLONASE) 50 MCG/ACT nasal spray Place 1 spray into both nostrils daily as needed for allergies. 16 g 0  . hydrochlorothiazide (HYDRODIURIL) 25 MG tablet Take 1 tablet (25 mg total) by mouth daily. 90 tablet 2  . ibuprofen (ADVIL,MOTRIN) 200 MG tablet Take 400 mg by mouth every 6 (six) hours as needed for mild pain.     Marland Kitchen levothyroxine (SYNTHROID, LEVOTHROID) 150 MCG tablet Take 75 mcg by mouth daily before breakfast.    . losartan (COZAAR) 25 MG tablet Take 1 tablet (25 mg total) by mouth daily. 90 tablet 2  . valACYclovir (VALTREX) 1000 MG tablet Take 1,000 mg by mouth 2 (two) times daily as needed (fever blisters).    Marland Kitchen aspirin 81 MG tablet Take 1 tablet (81 mg total) by mouth daily. 30 tablet 0  . pantoprazole (PROTONIX) 40 MG tablet Take 40 mg by mouth at bedtime.    . rosuvastatin (CRESTOR) 10 MG tablet Take 1 tablet (10 mg total) by mouth daily at 6 PM. 30 tablet 1   No facility-administered medications prior to visit.      Per HPI unless specifically indicated in ROS section below Review of Systems  Objective:    BP 122/78   Pulse 98   Temp 98 F (36.7 C) (Oral)   Wt 200 lb 8 oz (90.9 kg)   SpO2 98%   BMI 36.67 kg/m   Wt Readings from Last 3 Encounters:  03/30/17 200 lb 8 oz (90.9 kg)  01/26/17 198 lb 12.8 oz (90.2 kg)  01/19/17 197 lb 11.2 oz (89.7 kg)    Physical Exam  Constitutional: She appears well-developed and well-nourished. No distress.  HENT:  Mouth/Throat: Oropharynx is clear and moist. No oropharyngeal exudate.  Eyes: Pupils are equal, round, and reactive to light. Conjunctivae and EOM are normal. No scleral icterus.  Cardiovascular: Normal rate, regular rhythm, normal heart sounds and intact distal pulses.   No murmur heard. Pulmonary/Chest: Effort normal and breath sounds normal. No respiratory distress. She has no  wheezes. She has no rales.  Musculoskeletal: She exhibits no edema.  Skin: Skin is warm and dry. No rash noted.  Psychiatric: She has a normal mood and affect.  Nursing note and vitals reviewed.  Results for orders placed or performed in visit on 01/12/17  Comprehensive metabolic panel  Result Value Ref Range   Sodium 143 136 - 145 mEq/L   Potassium 4.9 3.5 - 5.1 mEq/L   Chloride 103 98 - 109 mEq/L   CO2 31 (H) 22 - 29 mEq/L   Glucose 92 70 - 140 mg/dl   BUN 21.7 7.0 - 26.0 mg/dL   Creatinine 0.8 0.6 - 1.1 mg/dL   Total Bilirubin 2.07 (H) 0.20 - 1.20 mg/dL   Alkaline Phosphatase 79 40 - 150 U/L   AST 16 5 - 34 U/L   ALT 17 0 - 55 U/L   Total Protein 7.5 6.4 - 8.3 g/dL   Albumin 4.1 3.5 - 5.0 g/dL   Calcium 10.1 8.4 - 10.4 mg/dL   Anion Gap 9 3 - 11 mEq/L   EGFR 80 (L) >90 ml/min/1.73 m2  CBC with Differential/Platelet  Result Value Ref Range   WBC 8.3 3.9 - 10.3 10e3/uL   NEUT# 5.4 1.5 - 6.5 10e3/uL   HGB 13.1 11.6 - 15.9 g/dL   HCT 39.2 34.8 - 46.6 %   Platelets 245 145 - 400 10e3/uL   MCV 82.0 79.5 - 101.0 fL   MCH 27.4 25.1 - 34.0 pg   MCHC 33.4 31.5 - 36.0 g/dL   RBC 4.78 3.70 - 5.45 10e6/uL   RDW 13.3 11.2 - 14.5 %   lymph# 1.9 0.9 - 3.3 10e3/uL   MONO# 0.5 0.1 - 0.9 10e3/uL   Eosinophils Absolute 0.5 0.0 - 0.5 10e3/uL   Basophils Absolute 0.1 0.0 - 0.1 10e3/uL   NEUT% 65.0 38.4 - 76.8 %   LYMPH% 22.6 14.0 - 49.7 %   MONO% 6.1 0.0 - 14.0 %   EOS% 5.7 0.0 - 7.0 %   BASO% 0.6 0.0 - 2.0 %   Lab Results  Component Value Date   TSH 1.98 11/12/2016       Assessment & Plan:   Problem List Items Addressed This Visit    Chronic cough    Discussed tapering off of protonix.       Globus sensation    Discussed tapering schedule for protonix.       HTN (hypertension)    Chronic, stable. Continue current regimen.       Hyperbilirubinemia    Precedes statin. Possible Gilbert disease. Will continue to monitor.       Hyperlipidemia - Primary  Last LDL  167. She has been on crestor for last several months. No strong fmhx premature CAD. Low 10 yr risk. Suggested she stop crestor and aspirin and will recheck [place] next fasting labwork.       Hypothyroidism    Recent TSH was stable.       Obesity, Class II, BMI 35-39.9, no comorbidity    Pt considering weight watchers.           Follow up plan: Return in about 8 months (around 11/28/2017) for follow up visit.  [name], MD

## 2017-06-15 ENCOUNTER — Other Ambulatory Visit: Payer: Self-pay | Admitting: Family Medicine

## 2017-06-20 ENCOUNTER — Other Ambulatory Visit: Payer: Self-pay | Admitting: Thoracic Surgery (Cardiothoracic Vascular Surgery)

## 2017-06-20 DIAGNOSIS — C3411 Malignant neoplasm of upper lobe, right bronchus or lung: Secondary | ICD-10-CM

## 2017-06-21 ENCOUNTER — Ambulatory Visit: Payer: BLUE CROSS/BLUE SHIELD | Admitting: Thoracic Surgery (Cardiothoracic Vascular Surgery)

## 2017-06-21 ENCOUNTER — Encounter: Payer: Self-pay | Admitting: Thoracic Surgery (Cardiothoracic Vascular Surgery)

## 2017-06-21 ENCOUNTER — Other Ambulatory Visit: Payer: Self-pay

## 2017-06-21 ENCOUNTER — Ambulatory Visit
Admission: RE | Admit: 2017-06-21 | Discharge: 2017-06-21 | Disposition: A | Discharge status: Home / Self Care | Payer: BLUE CROSS/BLUE SHIELD | Source: Ambulatory Visit | Attending: Thoracic Surgery (Cardiothoracic Vascular Surgery) | Admitting: Thoracic Surgery (Cardiothoracic Vascular Surgery)

## 2017-06-21 VITALS — BP 130/79 | HR 80 | Resp 16 | Ht 62.0 in | Wt 197.0 lb

## 2017-06-21 DIAGNOSIS — Z902 Acquired absence of lung [part of]: Secondary | ICD-10-CM | POA: Diagnosis not present

## 2017-06-21 DIAGNOSIS — C3411 Malignant neoplasm of upper lobe, right bronchus or lung: Secondary | ICD-10-CM

## 2017-06-21 DIAGNOSIS — C3491 Malignant neoplasm of unspecified part of right bronchus or lung: Secondary | ICD-10-CM | POA: Diagnosis not present

## 2017-06-21 DIAGNOSIS — R918 Other nonspecific abnormal finding of lung field: Secondary | ICD-10-CM | POA: Diagnosis not present

## 2017-06-21 NOTE — Progress Notes (Signed)
[address]       [place] [address]             [phone]     HPI: Monica [name] returns for a one year follow-up visit  She is a 51 year old woman who is a lifelong non-smoker.  She had a thoracoscopic right upper lobectomy for stage Ia adenocarcinoma in November 2017.  I last saw her in the office in February.  She was doing well at that time but did have a persistent cough.  Since her last visit the cough has persisted.  Says it feels like a tickle in her throat and is frequently worse at night.  She had been on Protonix daily but now is taking it every other day.  Past Medical History:  Diagnosis Date  . Essential hypertension   . History of chicken pox   . Hyperbilirubinemia 01/19/2017  . Hyperlipidemia   . Hypothyroidism   . Primary adenocarcinoma of upper lobe of right lung ([facility]) 05/28/2016   RUL apex spiculated ~2cm nodule concerning by PET scan - referred to multidisciplinary thoracic oncology clinic S/p thoracoscopic RULobectomy Roxan Hockey) 06/2016  . Sarcoidosis, lung ([facility]) ~2004   treated with prendisone and stable since then    Current Outpatient Medications  Medication Sig Dispense Refill  . albuterol (PROVENTIL HFA;VENTOLIN HFA) 108 (90 Base) MCG/ACT inhaler Inhale 2 puffs into the lungs every 6 (six) hours as needed for wheezing or shortness of breath. 1 Inhaler 2  . Cholecalciferol (VITAMIN D-3) 1000 units CAPS Take 1,000 Units by mouth at bedtime.    . fluticasone (FLONASE) 50 MCG/ACT nasal spray Place 1 spray into both nostrils daily as needed for allergies. 16 g 0  . hydrochlorothiazide (HYDRODIURIL) 25 MG tablet Take 1 tablet (25 mg total) by mouth daily. 90 tablet 2  . ibuprofen (ADVIL,MOTRIN) 200 MG tablet Take 400 mg by mouth every 6 (six) hours as needed for mild pain.     Marland Kitchen losartan (COZAAR) 25 MG tablet Take 1 tablet (25 mg total) by mouth daily. 90 tablet 2  . pantoprazole (PROTONIX) 40 MG tablet Take 1 tablet (40 mg  total) by mouth every other day.    Marland Kitchen SYNTHROID 150 MCG tablet TAKE 1 TABLET(150 MCG) BY MOUTH DAILY BEFORE BREAKFAST 30 tablet 5  . valACYclovir (VALTREX) 1000 MG tablet Take 1,000 mg by mouth 2 (two) times daily as needed (fever blisters).     No current facility-administered medications for this visit.     Physical Exam BP 130/79 (BP Location: Right Arm, Patient Position: Sitting, Cuff Size: Large)   Pulse 80   Resp 16   Ht 5\' 2"  (1.575 m)   Wt 197 lb (89.4 kg)   SpO2 99% Comment: ON RA  BMI 36.03 kg/m  Well-appearing 51 year old woman in no acute distress Alert and oriented x3 with no focal deficits Lungs slight diminished right base, otherwise clear No cervical or subclavicular adenopathy Cardiac regular rate and rhythm normal S1-S2  Diagnostic Tests: CHEST  2 VIEW  COMPARISON:  CT chest 01/12/2017. Chest x-rays 12/30/2016, 09/21/2016 and earlier.  FINDINGS: Cardiomediastinal silhouette unremarkable. Post surgical pleuroparenchymal scarring involving the right lung post right upper lobectomy, unchanged. Lungs otherwise clear. Normal pulmonary vascularity. Normal bronchovascular markings. No pleural effusions. Minimal degenerative changes involving the thoracic spine.  IMPRESSION: No acute cardiopulmonary disease. Stable post surgical changes related to right upper lobectomy.   Electronically Signed   By: Evangeline Dakin M.D.   On:  06/21/2017 09:26 I personally reviewed the chest x-ray images and concur with the findings noted above.  I also reviewed her previous CT from June.  Impression: Monica [name] is a 51 year old woman who had a thoracoscopic right upper lobectomy for a stage Ia adenocarcinoma a year ago.  She has no evidence of recurrent disease.  Her only problem related to the surgery is a persistent cough.  Not sure this is directly related to her surgery but it sometimes does occur after lumpectomy.  Her description of this being a tickle in her  cough makes me think it could be more related to something in her vocal cords or reflux.  The treatment with Protonix does not seem to make any significant difference.  She might want to consider it to run otolaryngology consult.  Bronchoscopy is also a consideration although I think the yield is pretty low and after discussing that with her she is not interested at this time.  Plan: Continue to follow-up with Dr. Julien Nordmann  Return in 1 year with chest x-ray  Melrose Nakayama, MD [facility] [phone]

## 2017-07-02 LAB — HM MAMMOGRAPHY: HM MAMMO: NORMAL (ref 0–4)

## 2017-07-20 ENCOUNTER — Encounter (HOSPITAL_COMMUNITY): Payer: Self-pay

## 2017-07-20 ENCOUNTER — Ambulatory Visit (HOSPITAL_COMMUNITY)
Admission: RE | Admit: 2017-07-20 | Discharge: 2017-07-20 | Disposition: A | Discharge status: Home / Self Care | Payer: BLUE CROSS/BLUE SHIELD | Source: Ambulatory Visit | Attending: Internal Medicine | Admitting: Internal Medicine

## 2017-07-20 ENCOUNTER — Other Ambulatory Visit (HOSPITAL_BASED_OUTPATIENT_CLINIC_OR_DEPARTMENT_OTHER): Payer: BLUE CROSS/BLUE SHIELD

## 2017-07-20 DIAGNOSIS — C3411 Malignant neoplasm of upper lobe, right bronchus or lung: Secondary | ICD-10-CM

## 2017-07-20 DIAGNOSIS — R918 Other nonspecific abnormal finding of lung field: Secondary | ICD-10-CM | POA: Insufficient documentation

## 2017-07-20 DIAGNOSIS — Z902 Acquired absence of lung [part of]: Secondary | ICD-10-CM | POA: Diagnosis not present

## 2017-07-20 LAB — COMPREHENSIVE METABOLIC PANEL
ALBUMIN: 4.2 g/dL (ref 3.5–5.0)
ALT: 12 U/L (ref 0–55)
AST: 15 U/L (ref 5–34)
Alkaline Phosphatase: 77 U/L (ref 40–150)
Anion Gap: 12 mEq/L — ABNORMAL HIGH (ref 3–11)
BUN: 13.4 mg/dL (ref 7.0–26.0)
CALCIUM: 9.7 mg/dL (ref 8.4–10.4)
CO2: 26 mEq/L (ref 22–29)
CREATININE: 0.8 mg/dL (ref 0.6–1.1)
Chloride: 102 mEq/L (ref 98–109)
EGFR: >60 mL/min/{1.73_m2} (ref >60)
Glucose: 88 mg/dl (ref 70–140)
POTASSIUM: 4.1 meq/L (ref 3.5–5.1)
Sodium: 140 mEq/L (ref 136–145)
Total Bilirubin: 1.71 mg/dL — ABNORMAL HIGH (ref 0.20–1.20)
Total Protein: 7.5 g/dL (ref 6.4–8.3)

## 2017-07-20 LAB — CBC WITH DIFFERENTIAL/PLATELET
BASO%: 0.4 % (ref 0.0–2.0)
Basophils Absolute: 0 10*3/uL (ref 0.0–0.1)
EOS%: 1.3 % (ref 0.0–7.0)
Eosinophils Absolute: 0.1 10*3/uL (ref 0.0–0.5)
HCT: 39.5 % (ref 34.8–46.6)
HGB: 13.5 g/dL (ref 11.6–15.9)
LYMPH%: 20.9 % (ref 14.0–49.7)
MCH: 27.5 pg (ref 25.1–34.0)
MCHC: 34.2 g/dL (ref 31.5–36.0)
MCV: 80.4 fL (ref 79.5–101.0)
MONO#: 0.4 10*3/uL (ref 0.1–0.9)
MONO%: 4.3 % (ref 0.0–14.0)
NEUT%: 73.1 % (ref 38.4–76.8)
NEUTROS ABS: 6.2 10*3/uL (ref 1.5–6.5)
Platelets: 265 10*3/uL (ref 145–400)
RBC: 4.91 10*6/uL (ref 3.70–5.45)
RDW: 13.8 % (ref 11.2–14.5)
WBC: 8.5 10*3/uL (ref 3.9–10.3)
lymph#: 1.8 10*3/uL (ref 0.9–3.3)
nRBC: 0 % (ref 0–0)

## 2017-07-20 MED ORDER — IOPAMIDOL (ISOVUE-300) INJECTION 61%
75.0000 mL | Freq: Once | INTRAVENOUS | Status: AC | PRN
  Administered 2017-07-20: 75 mL via INTRAVENOUS

## 2017-07-20 MED ORDER — IOPAMIDOL (ISOVUE-300) INJECTION 61%
INTRAVENOUS | Status: AC
  Filled 2017-07-20: qty 75

## 2017-07-27 ENCOUNTER — Telehealth: Payer: Self-pay | Admitting: Internal Medicine

## 2017-07-27 ENCOUNTER — Encounter: Payer: Self-pay | Admitting: Internal Medicine

## 2017-07-27 ENCOUNTER — Ambulatory Visit (HOSPITAL_BASED_OUTPATIENT_CLINIC_OR_DEPARTMENT_OTHER): Payer: BLUE CROSS/BLUE SHIELD | Admitting: Internal Medicine

## 2017-07-27 DIAGNOSIS — C3490 Malignant neoplasm of unspecified part of unspecified bronchus or lung: Secondary | ICD-10-CM

## 2017-07-27 DIAGNOSIS — C3411 Malignant neoplasm of upper lobe, right bronchus or lung: Secondary | ICD-10-CM

## 2017-07-27 NOTE — Telephone Encounter (Signed)
Scheduled appt per 12/26 los - Gave patient AVS and calender per los.  Lab and f/u in 6 months Central radiology to contact patient with ct

## 2017-07-27 NOTE — Progress Notes (Signed)
[place] [phone]   [phone]  OFFICE PROGRESS NOTE  [name], MD [place] [address]  DIAGNOSIS: Stage IA (T1a, N0, M0) non-small cell lung cancer, adenocarcinoma presented with right upper lobe pulmonary nodule diagnosed in November 2017.  PRIOR THERAPY:status post right upper lobectomy with lymph node dissection in November 2017 under the care of Dr. Roxan Hockey.  CURRENT THERAPY: Observation.  INTERVAL HISTORY: Monica [name] 51 y.o. female returns to the clinic today for 6 months follow-up visit.  The patient is feeling fine today with no specific complaints.  She denied having any chest pain, shortness of breath, cough or hemoptysis.  She denied having any recent weight loss or night sweats.  She has no nausea, vomiting, diarrhea or constipation.  She had repeat CT scan of the chest performed recently and she is here for evaluation and discussion of her scan results.  MEDICAL HISTORY: Past Medical History:  Diagnosis Date  . Essential hypertension   . History of chicken pox   . Hyperbilirubinemia 01/19/2017  . Hyperlipidemia   . Hypothyroidism   . Primary adenocarcinoma of upper lobe of right lung ([place]) 05/28/2016   RUL apex spiculated ~2cm nodule concerning by PET scan - referred to multidisciplinary thoracic oncology clinic S/p thoracoscopic RULobectomy Roxan Hockey) 06/2016  . Sarcoidosis, lung ([place]) ~2004   treated with prendisone and stable since then    ALLERGIES:  has No Known Allergies.  MEDICATIONS:  Current Outpatient Medications  Medication Sig Dispense Refill  . albuterol (PROVENTIL HFA;VENTOLIN HFA) 108 (90 Base) MCG/ACT inhaler Inhale 2 puffs into the lungs every 6 (six) hours as needed for wheezing or shortness of breath. 1 Inhaler 2  . Cholecalciferol (VITAMIN D-3) 1000 units CAPS Take 1,000 Units by mouth at bedtime.    . fluticasone (FLONASE) 50 MCG/ACT nasal spray Place 1  spray into both nostrils daily as needed for allergies. 16 g 0  . hydrochlorothiazide (HYDRODIURIL) 25 MG tablet Take 1 tablet (25 mg total) by mouth daily. 90 tablet 2  . ibuprofen (ADVIL,MOTRIN) 200 MG tablet Take 400 mg by mouth every 6 (six) hours as needed for mild pain.     Marland Kitchen losartan (COZAAR) 25 MG tablet Take 1 tablet (25 mg total) by mouth daily. 90 tablet 2  . pantoprazole (PROTONIX) 40 MG tablet Take 1 tablet (40 mg total) by mouth every other day.    Marland Kitchen SYNTHROID 150 MCG tablet TAKE 1 TABLET(150 MCG) BY MOUTH DAILY BEFORE BREAKFAST 30 tablet 5  . valACYclovir (VALTREX) 1000 MG tablet Take 1,000 mg by mouth 2 (two) times daily as needed (fever blisters).     No current facility-administered medications for this visit.     SURGICAL HISTORY:  Past Surgical History:  Procedure Laterality Date  . CARDIOVASCULAR STRESS TEST  12/2016   Nuclear CP stress test: hypertensive response to exercise, normal low risk study, EF 68%  . COLONOSCOPY  09/2015   WNL [name])  . ENDOMETRIAL ABLATION W/ NOVASURE  01/2009   Archie Endo 12/02/2010  . LOBECTOMY Right 06/14/2016   RU Lobectomy; Surgeon: Melrose Nakayama, MD  . LYMPH NODE DISSECTION Right 06/14/2016   RIGHT UPPER LOBE LYMPH NODE DISSECTION;  Surgeon: Melrose Nakayama, MD  . [employer] Bilateral 1998  . TUBAL LIGATION  2001  . VIDEO ASSISTED THORACOSCOPY (VATS)/WEDGE RESECTION Right 06/14/2016   Surgeon: Melrose Nakayama, MD    REVIEW OF SYSTEMS:  A comprehensive review  of systems was negative.   PHYSICAL EXAMINATION: General appearance: alert, cooperative and no distress Head: Normocephalic, without obvious abnormality, atraumatic Neck: no adenopathy, no JVD, supple, symmetrical, trachea midline and thyroid not enlarged, symmetric, no tenderness/mass/nodules Lymph nodes: Cervical, supraclavicular, and axillary nodes normal. Resp: clear to auscultation bilaterally Back: symmetric, no curvature. ROM normal. No CVA  tenderness. Cardio: regular rate and rhythm, S1, S2 normal, no murmur, click, rub or gallop GI: soft, non-tender; bowel sounds normal; no masses,  no organomegaly Extremities: extremities normal, atraumatic, no cyanosis or edema  ECOG PERFORMANCE STATUS: 0 - Asymptomatic  Blood pressure (!) 142/85, pulse 89, temperature 98.4 F (36.9 C), temperature source Oral, resp. rate 20, height 5\' 2"  (1.575 m), weight 198 lb 3.2 oz (89.9 kg), SpO2 99 %.  LABORATORY DATA: Lab Results  Component Value Date   WBC 8.5 07/20/2017   HGB 13.5 07/20/2017   HCT 39.5 07/20/2017   MCV 80.4 07/20/2017   PLT 265 07/20/2017      Chemistry      Component Value Date/Time   NA 140 07/20/2017 1338   K 4.1 07/20/2017 1338   CL 104 12/30/2016 0832   CO2 26 07/20/2017 1338   BUN 13.4 07/20/2017 1338   CREATININE 0.8 07/20/2017 1338      Component Value Date/Time   CALCIUM 9.7 07/20/2017 1338   ALKPHOS 77 07/20/2017 1338   AST 15 07/20/2017 1338   ALT 12 07/20/2017 1338   BILITOT 1.71 (H) 07/20/2017 1338       RADIOGRAPHIC STUDIES: Ct Chest W Contrast  Result Date: 07/20/2017 CLINICAL DATA:  Right upper lobe adenocarcinoma in 2017, status post right upper lobectomy. Chronic cough. EXAM: CT CHEST WITH CONTRAST TECHNIQUE: Multidetector CT imaging of the chest was performed during intravenous contrast administration. CONTRAST:  58mL ISOVUE-300 IOPAMIDOL (ISOVUE-300) INJECTION 61% COMPARISON:  01/12/2017 FINDINGS: Cardiovascular: The heart size is normal. No pericardial effusion. No thoracic aortic aneurysm. Mediastinum/Nodes: 6 mm short axis prevascular lymph node is stable. No mediastinal lymphadenopathy 6 mm short axis left hilar lymph node measured on the previous study is unchanged. 10 mm short axis right hilar lymph node measured previously is stable. The esophagus has normal imaging features. There is no axillary lymphadenopathy. Lungs/Pleura: 4 mm nodule left apex is stable at 4 mm today. Tiny left  upper lobe ground-glass nodule seen on image 58 are unchanged. 4 mm nodule identified left lower lobe on image 63 is unchanged. 4 mm left lower lobe nodule seen on image 78 is stable. Multiple tiny ground-glass nodules in the posterior right lower lobe are similar to prior. Scarring in the parahilar right lung is compatible with the surgical history. No pleural effusion. Upper Abdomen: Unremarkable. Musculoskeletal: Bone windows reveal no worrisome lytic or sclerotic osseous lesions. IMPRESSION: 1. Stable exam. Status post right upper lobectomy. No new or progressive findings. 2. Scattered tiny bilateral pulmonary nodules, stable in the interval. Attention on follow-up recommended. Electronically Signed   By: Misty Stanley M.D.   On: 07/20/2017 16:47    ASSESSMENT AND PLAN: This is a very pleasant 51 years old white female with stage IA non-small cell lung cancer, adenocarcinoma presented with right upper lobe pulmonary nodule is status post right upper lobectomy with lymph node dissection in November 2017 under the care of Dr. Roxan Hockey. The patient is currently on observation and she is feeling very well. Her recent CT scan of the chest showed no evidence for disease recurrence. I discussed the scan results with the patient and  recommended for her to continue on observation with repeat CT scan of the chest in 6 months. She was advised to call immediately if she has any concerning symptoms in the interval. The patient voices understanding of current disease status and treatment options and is in agreement with the current care plan. All questions were answered. The patient knows to call the clinic with any problems, questions or concerns. We can certainly see the patient much sooner if necessary.  I spent 10 minutes counseling the patient face to face. The total time spent in the appointment was 15 minutes.  Disclaimer: This note was dictated with voice recognition software. Similar sounding words  can inadvertently be transcribed and may not be corrected upon review.

## 2017-07-29 DIAGNOSIS — H1131 Conjunctival hemorrhage, right eye: Secondary | ICD-10-CM | POA: Diagnosis not present

## 2017-08-22 ENCOUNTER — Other Ambulatory Visit: Payer: Self-pay | Admitting: Family Medicine

## 2017-08-28 DIAGNOSIS — J0100 Acute maxillary sinusitis, unspecified: Secondary | ICD-10-CM | POA: Diagnosis not present

## 2017-08-31 DIAGNOSIS — Z6836 Body mass index (BMI) 36.0-36.9, adult: Secondary | ICD-10-CM | POA: Diagnosis not present

## 2017-08-31 DIAGNOSIS — Z13820 Encounter for screening for osteoporosis: Secondary | ICD-10-CM | POA: Diagnosis not present

## 2017-08-31 DIAGNOSIS — Z808 Family history of malignant neoplasm of other organs or systems: Secondary | ICD-10-CM | POA: Diagnosis not present

## 2017-08-31 DIAGNOSIS — Z01419 Encounter for gynecological examination (general) (routine) without abnormal findings: Secondary | ICD-10-CM | POA: Diagnosis not present

## 2017-08-31 DIAGNOSIS — Z85118 Personal history of other malignant neoplasm of bronchus and lung: Secondary | ICD-10-CM | POA: Diagnosis not present

## 2017-08-31 DIAGNOSIS — Z803 Family history of malignant neoplasm of breast: Secondary | ICD-10-CM | POA: Diagnosis not present

## 2017-08-31 DIAGNOSIS — Z1231 Encounter for screening mammogram for malignant neoplasm of breast: Secondary | ICD-10-CM | POA: Diagnosis not present

## 2017-08-31 DIAGNOSIS — Z8042 Family history of malignant neoplasm of prostate: Secondary | ICD-10-CM | POA: Diagnosis not present

## 2017-10-11 DIAGNOSIS — Z809 Family history of malignant neoplasm, unspecified: Secondary | ICD-10-CM | POA: Diagnosis not present

## 2017-10-13 ENCOUNTER — Other Ambulatory Visit: Payer: Self-pay | Admitting: Obstetrics and Gynecology

## 2017-10-13 DIAGNOSIS — Z803 Family history of malignant neoplasm of breast: Secondary | ICD-10-CM

## 2017-10-21 DIAGNOSIS — D225 Melanocytic nevi of trunk: Secondary | ICD-10-CM | POA: Diagnosis not present

## 2017-10-21 DIAGNOSIS — L814 Other melanin hyperpigmentation: Secondary | ICD-10-CM | POA: Diagnosis not present

## 2017-10-21 DIAGNOSIS — L821 Other seborrheic keratosis: Secondary | ICD-10-CM | POA: Diagnosis not present

## 2017-10-21 DIAGNOSIS — D2261 Melanocytic nevi of right upper limb, including shoulder: Secondary | ICD-10-CM | POA: Diagnosis not present

## 2017-12-04 ENCOUNTER — Other Ambulatory Visit: Payer: Self-pay | Admitting: Family Medicine

## 2017-12-04 DIAGNOSIS — I10 Essential (primary) hypertension: Secondary | ICD-10-CM

## 2017-12-04 DIAGNOSIS — E039 Hypothyroidism, unspecified: Secondary | ICD-10-CM

## 2017-12-04 DIAGNOSIS — Z862 Personal history of diseases of the blood and blood-forming organs and certain disorders involving the immune mechanism: Secondary | ICD-10-CM

## 2017-12-04 DIAGNOSIS — E559 Vitamin D deficiency, unspecified: Secondary | ICD-10-CM

## 2017-12-04 DIAGNOSIS — E785 Hyperlipidemia, unspecified: Secondary | ICD-10-CM

## 2017-12-09 ENCOUNTER — Other Ambulatory Visit (INDEPENDENT_AMBULATORY_CARE_PROVIDER_SITE_OTHER): Payer: BLUE CROSS/BLUE SHIELD

## 2017-12-09 DIAGNOSIS — E559 Vitamin D deficiency, unspecified: Secondary | ICD-10-CM

## 2017-12-09 DIAGNOSIS — E039 Hypothyroidism, unspecified: Secondary | ICD-10-CM

## 2017-12-09 DIAGNOSIS — E785 Hyperlipidemia, unspecified: Secondary | ICD-10-CM | POA: Diagnosis not present

## 2017-12-09 DIAGNOSIS — I10 Essential (primary) hypertension: Secondary | ICD-10-CM | POA: Diagnosis not present

## 2017-12-09 DIAGNOSIS — Z862 Personal history of diseases of the blood and blood-forming organs and certain disorders involving the immune mechanism: Secondary | ICD-10-CM | POA: Diagnosis not present

## 2017-12-09 LAB — COMPREHENSIVE METABOLIC PANEL
ALT: 11 U/L (ref 0–35)
AST: 14 U/L (ref 0–37)
Albumin: 4.1 g/dL (ref 3.5–5.2)
Alkaline Phosphatase: 52 U/L (ref 39–117)
BUN: 14 mg/dL (ref 6–23)
CALCIUM: 9.4 mg/dL (ref 8.4–10.5)
CHLORIDE: 100 meq/L (ref 96–112)
CO2: 30 meq/L (ref 19–32)
CREATININE: 0.72 mg/dL (ref 0.40–1.20)
GFR: 89.96 mL/min (ref >60.00)
Glucose, Bld: 100 mg/dL — ABNORMAL HIGH (ref 70–99)
POTASSIUM: 4.1 meq/L (ref 3.5–5.1)
SODIUM: 138 meq/L (ref 135–145)
Total Bilirubin: 1.8 mg/dL — ABNORMAL HIGH (ref 0.2–1.2)
Total Protein: 7 g/dL (ref 6.0–8.3)

## 2017-12-09 LAB — CBC WITH DIFFERENTIAL/PLATELET
BASOS ABS: 0.1 10*3/uL (ref 0.0–0.1)
BASOS PCT: 0.8 % (ref 0.0–3.0)
Eosinophils Absolute: 0.2 10*3/uL (ref 0.0–0.7)
Eosinophils Relative: 2.7 % (ref 0.0–5.0)
HCT: 39.8 % (ref 36.0–46.0)
Hemoglobin: 13.6 g/dL (ref 12.0–15.0)
Lymphocytes Relative: 23 % (ref 12.0–46.0)
Lymphs Abs: 1.9 10*3/uL (ref 0.7–4.0)
MCHC: 34.1 g/dL (ref 30.0–36.0)
MCV: 80.9 fl (ref 78.0–100.0)
Monocytes Absolute: 0.5 10*3/uL (ref 0.1–1.0)
Monocytes Relative: 6 % (ref 3.0–12.0)
NEUTROS ABS: 5.5 10*3/uL (ref 1.4–7.7)
Neutrophils Relative %: 67.5 % (ref 43.0–77.0)
PLATELETS: 286 10*3/uL (ref 150.0–400.0)
RBC: 4.92 Mil/uL (ref 3.87–5.11)
RDW: 13.8 % (ref 11.5–15.5)
WBC: 8.1 10*3/uL (ref 4.0–10.5)

## 2017-12-09 LAB — LIPID PANEL
CHOL/HDL RATIO: 4
CHOLESTEROL: 203 mg/dL — AB (ref 0–200)
HDL: 48.4 mg/dL (ref >39.00)
LDL CALC: 138 mg/dL — AB (ref 0–99)
NonHDL: 154.21
TRIGLYCERIDES: 83 mg/dL (ref 0.0–149.0)
VLDL: 16.6 mg/dL (ref 0.0–40.0)

## 2017-12-09 LAB — MICROALBUMIN / CREATININE URINE RATIO
Creatinine,U: 93.8 mg/dL
Microalb Creat Ratio: 0.7 mg/g (ref 0.0–30.0)
Microalb, Ur: <0.7 mg/dL (ref 0.0–1.9)

## 2017-12-09 LAB — BASIC METABOLIC PANEL
BUN: 14 mg/dL (ref 6–23)
CALCIUM: 9.4 mg/dL (ref 8.4–10.5)
CO2: 30 mEq/L (ref 19–32)
CREATININE: 0.72 mg/dL (ref 0.40–1.20)
Chloride: 100 mEq/L (ref 96–112)
GFR: 89.96 mL/min (ref >60.00)
GLUCOSE: 100 mg/dL — AB (ref 70–99)
POTASSIUM: 4.1 meq/L (ref 3.5–5.1)
Sodium: 138 mEq/L (ref 135–145)

## 2017-12-09 LAB — VITAMIN D 25 HYDROXY (VIT D DEFICIENCY, FRACTURES): VITD: 29.12 ng/mL — ABNORMAL LOW (ref 30.00–100.00)

## 2017-12-09 LAB — TSH: TSH: 1.59 u[IU]/mL (ref 0.35–4.50)

## 2017-12-15 ENCOUNTER — Other Ambulatory Visit: Payer: Self-pay | Admitting: Physician Assistant

## 2017-12-16 ENCOUNTER — Encounter: Payer: Self-pay | Admitting: Family Medicine

## 2017-12-16 ENCOUNTER — Ambulatory Visit (INDEPENDENT_AMBULATORY_CARE_PROVIDER_SITE_OTHER): Payer: BLUE CROSS/BLUE SHIELD | Admitting: Family Medicine

## 2017-12-16 VITALS — BP 120/84 | HR 78 | Temp 97.9°F | Ht 62.0 in | Wt 195.5 lb

## 2017-12-16 DIAGNOSIS — E039 Hypothyroidism, unspecified: Secondary | ICD-10-CM | POA: Diagnosis not present

## 2017-12-16 DIAGNOSIS — Z0000 Encounter for general adult medical examination without abnormal findings: Secondary | ICD-10-CM

## 2017-12-16 DIAGNOSIS — I10 Essential (primary) hypertension: Secondary | ICD-10-CM | POA: Diagnosis not present

## 2017-12-16 DIAGNOSIS — E559 Vitamin D deficiency, unspecified: Secondary | ICD-10-CM | POA: Diagnosis not present

## 2017-12-16 DIAGNOSIS — E669 Obesity, unspecified: Secondary | ICD-10-CM | POA: Diagnosis not present

## 2017-12-16 DIAGNOSIS — Z85118 Personal history of other malignant neoplasm of bronchus and lung: Secondary | ICD-10-CM

## 2017-12-16 DIAGNOSIS — E785 Hyperlipidemia, unspecified: Secondary | ICD-10-CM

## 2017-12-16 DIAGNOSIS — R05 Cough: Secondary | ICD-10-CM

## 2017-12-16 DIAGNOSIS — R053 Chronic cough: Secondary | ICD-10-CM

## 2017-12-16 MED ORDER — LOSARTAN POTASSIUM 25 MG PO TABS: ORAL_TABLET | ORAL | 3 refills | Status: DC

## 2017-12-16 MED ORDER — PANTOPRAZOLE SODIUM 40 MG PO TBEC: 40.0000 mg | DELAYED_RELEASE_TABLET | Freq: Every day | ORAL | 11 refills | Status: DC

## 2017-12-16 MED ORDER — HYDROCHLOROTHIAZIDE 25 MG PO TABS: 25.0000 mg | ORAL_TABLET | Freq: Every day | ORAL | 3 refills | Status: DC

## 2017-12-16 MED ORDER — LEVOTHYROXINE SODIUM 150 MCG PO TABS: 150.0000 ug | ORAL_TABLET | Freq: Every day | ORAL | 3 refills | Status: DC

## 2017-12-16 NOTE — Assessment & Plan Note (Signed)
Continue to encourage healthy diet and lifestyle changes for sustainable weight loss. Has implemented healthy diet changes over the last year with improvement in cholesterol control - congratulated.

## 2017-12-16 NOTE — Assessment & Plan Note (Signed)
Chronic, stable. improved control with healthy diet changes.  The 10-year ASCVD risk score Mikey Bussing [name]., et al., 2013) is: 2.3%   Values used to calculate the score:     Age: 52 years     Sex: Female     Is Non-Hispanic African American: No     Diabetic: No     Tobacco smoker: No     Systolic Blood Pressure: 838 mmHg     Is BP treated: Yes     HDL Cholesterol: 48.4 mg/dL     Total Cholesterol: 203 mg/dL

## 2017-12-16 NOTE — Assessment & Plan Note (Signed)
Chronic, stable. Continue current regimen. 

## 2017-12-16 NOTE — Assessment & Plan Note (Signed)
Continue 2000 IU replacement - almost at goal

## 2017-12-16 NOTE — Assessment & Plan Note (Signed)
?  GERD related. If persists, consider ENT eval.

## 2017-12-16 NOTE — Progress Notes (Signed)
BP 120/84 (BP Location: Left Arm, Patient Position: Sitting, Cuff Size: Normal)   Pulse 78   Temp 97.9 F (36.6 C) (Oral)   Ht 5' 2" (1.575 m)   Wt 195 lb 8 oz (88.7 kg)   SpO2 100%   BMI 35.76 kg/m    CC: CPE Subjective:    Patient ID: Monica [name], female    DOB: [birth year], 52 y.o.   MRN: [id number]  HPI: Monica [name] is a 52 y.o. female presenting on 12/16/2017 for Annual Exam   Stage IA lung adenocarcinoma s/p thoracoscopic RU lobectomy 06/2016 - incidentally found on CXR for sarcoidosis. Adjuvant chemo was not recommended. Followed regularly by onc [name]). Persistent nagging dry cough - daily PPI has been helpful.   Preventative: Colonoscopy 09/2015 WNL [name]) Well woman yearly 07/2017 [facility]) [place] yearly with OBGYN - normal 07/2017 LMP 2010 s/p endometrial ablation, h/o polyps BRCA gene negative Flu shot work Tdap 10/2016 Seat belt use discussed Sunscreen use discussed. No changing moles on skin. Sees derm regularly. 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 Interim medical history since our last visit reviewed. Allergies and medications reviewed and updated. Outpatient Medications Prior to Visit  Medication Sig Dispense Refill  . Cholecalciferol (VITAMIN D-3) 1000 units CAPS Take 1,000 Units by mouth at bedtime.    . fluticasone (FLONASE) 50 MCG/ACT nasal spray Place 1 spray into both nostrils daily as needed for allergies. 16 g 0  . ibuprofen (ADVIL,MOTRIN) 200 MG tablet Take 400 mg by mouth every 6 (six) hours as needed for mild pain.     . valACYclovir (VALTREX) 1000 MG tablet Take 1,000 mg by mouth 2 (two) times daily as needed (fever blisters).      . hydrochlorothiazide (HYDRODIURIL) 25 MG tablet Take 1 tablet (25 mg total) by mouth daily. Please schedule follow up with Dr Meda Coffee. 30 tablet 0  . losartan (COZAAR) 25 MG tablet TAKE 1 TABLET(25 MG) BY MOUTH DAILY 30 tablet 0  . pantoprazole (PROTONIX) 40 MG tablet TAKE 1 TABLET(40 MG) BY MOUTH DAILY 30 tablet 3  . SYNTHROID 150 MCG tablet TAKE 1 TABLET(150 MCG) BY MOUTH DAILY BEFORE BREAKFAST 30 tablet 5  . albuterol (PROVENTIL HFA;VENTOLIN HFA) 108 (90 Base) MCG/ACT inhaler Inhale 2 puffs into the lungs every 6 (six) hours as needed for wheezing or shortness of breath. 1 Inhaler 2  . pantoprazole (PROTONIX) 40 MG tablet Take 1 tablet (40 mg total) by mouth every other day.     No facility-administered medications prior to visit.      Per HPI unless specifically indicated in ROS section below Review of Systems  Constitutional: Negative for activity change, appetite change, chills, fatigue, fever and unexpected weight change.  HENT: Negative for hearing loss.   Eyes: Negative for visual disturbance.  Respiratory: Positive for cough (chronic). Negative for chest tightness, shortness of breath and wheezing.   Cardiovascular: Negative for chest pain, palpitations and leg swelling.  Gastrointestinal: Negative for abdominal distention, abdominal pain, blood in stool, constipation, diarrhea, nausea and vomiting.  Genitourinary: Negative for difficulty urinating and hematuria.  Musculoskeletal: Negative for arthralgias, myalgias and neck pain.  Skin: Negative for rash.  Neurological: Negative for dizziness, seizures, syncope and headaches.  Hematological: Negative for adenopathy. Does not bruise/bleed easily.  Psychiatric/Behavioral: Negative for dysphoric mood. The patient is not nervous/anxious.        Objective:    BP 120/84 (BP Location: Left Arm, Patient Position: Sitting, Cuff Size: Normal)   Pulse 78   Temp 97.9 F (36.6 C) (Oral)   Ht 5' 2" (1.575 m)   Wt 195 lb 8 oz (88.7 kg)    SpO2 100%   BMI 35.76 kg/m   Wt Readings from Last 3 Encounters:  12/16/17 195 lb 8 oz (88.7 kg)  07/27/17 198 lb 3.2 oz (89.9 kg)  06/21/17 197 lb (89.4 kg)    Physical Exam  Constitutional: She is oriented to person, place, and time. She appears well-developed and well-nourished. No distress.  HENT:  Head: Normocephalic and atraumatic.  Right Ear: Hearing, tympanic membrane, external ear and ear canal normal.  Left Ear: Hearing, tympanic membrane, external ear and ear canal normal.  Nose: Nose normal.  Mouth/Throat: Uvula is midline, oropharynx is clear and moist and mucous membranes are normal. No oropharyngeal exudate, posterior oropharyngeal edema or posterior oropharyngeal erythema.  Eyes: Pupils are equal, round, and reactive to light. Conjunctivae and EOM are normal. No scleral icterus.  Neck: Normal range of motion. Neck supple. No thyromegaly present.  Cardiovascular: Normal rate, regular rhythm, normal heart sounds and intact distal pulses.  No murmur heard. Pulses:      Radial pulses are 2+ on the right side, and 2+ on the left side.  Pulmonary/Chest: Effort normal and breath sounds normal. No respiratory distress. She has no wheezes. She has no rales.  Abdominal: Soft. Bowel sounds are normal. She exhibits no distension and no mass. There is no tenderness. There is no rebound and no guarding.  Musculoskeletal: Normal range of motion. She exhibits no edema.  Lymphadenopathy:    She has no cervical adenopathy.  Neurological: She is alert and oriented to person, place, and time.  CN grossly intact, station and gait intact  Skin: Skin is warm and dry. No rash noted.  Psychiatric: She has a normal mood and affect. Her behavior is normal. Judgment and thought content normal.  Nursing note and vitals reviewed.  Results for orders placed or performed in visit on 12/16/17  [facility] MAMMOGRAPHY  Result Value Ref Range   HM Mammogram Self Reported Normal 0-4 Bi-Rad, Self Reported  Normal      Assessment & Plan:   Problem List Items Addressed This Visit    Chronic cough    ?GERD related. If persists, consider ENT eval.       Health maintenance examination - Primary    Preventative protocols reviewed and updated unless pt declined. Discussed healthy diet and lifestyle.       History of adenocarcinoma of lung    No signs of recurrence. Appreciate onc and thoracic surgery care.       HTN (hypertension)    Chronic, stable. Continue current regimen.       Relevant Medications   losartan (COZAAR) 25 MG tablet   hydrochlorothiazide (HYDRODIURIL) 25 MG tablet   Hyperlipidemia    Chronic, stable. improved control with healthy diet changes.  The 10-year ASCVD risk score Mikey Bussing DC Jr., et al., 2013) is: 2.3%   Values used to calculate the score:     Age: 69 years     Sex: Female     Is Non-Hispanic African American: No     Diabetic: No     Tobacco smoker: No  Systolic Blood Pressure: 683 mmHg     Is BP treated: Yes     HDL Cholesterol: 48.4 mg/dL     Total Cholesterol: 203 mg/dL       Relevant Medications   losartan (COZAAR) 25 MG tablet   hydrochlorothiazide (HYDRODIURIL) 25 MG tablet   Hypothyroidism    Chronic, stable. Continue current regimen.       Relevant Medications   levothyroxine (SYNTHROID) 150 MCG tablet   Obesity, Class II, BMI 35-39.9, no comorbidity    Continue to encourage healthy diet and lifestyle changes for sustainable weight loss. Has implemented healthy diet changes over the last year with improvement in cholesterol control - congratulated.       Vitamin D deficiency    Continue 2000 IU replacement - almost at goal          Meds ordered this encounter  Medications  . levothyroxine (SYNTHROID) 150 MCG tablet    Sig: Take 1 tablet (150 mcg total) by mouth daily before breakfast.    Dispense:  90 tablet    Refill:  3  . pantoprazole (PROTONIX) 40 MG tablet    Sig: Take 1 tablet (40 mg total) by mouth daily.     Dispense:  30 tablet    Refill:  11  . losartan (COZAAR) 25 MG tablet    Sig: TAKE 1 TABLET(25 MG) BY MOUTH DAILY    Dispense:  90 tablet    Refill:  3  . hydrochlorothiazide (HYDRODIURIL) 25 MG tablet    Sig: Take 1 tablet (25 mg total) by mouth daily.    Dispense:  90 tablet    Refill:  3   Orders Placed This Encounter  Procedures  . [facility] MAMMOGRAPHY    This external order was created through the Results Console.    Follow up plan: Return in about 1 year (around 12/17/2018) for annual exam, prior fasting for blood work.  [name], MD

## 2017-12-16 NOTE — Patient Instructions (Signed)
You are doing well today Congratulations on significant improvement in cholesterol! Keep up the good work. Return as needed or in 1 year for next physical. Health Maintenance, Female Adopting a healthy lifestyle and getting preventive care can go a long way to promote health and wellness. Talk with your health care provider about what schedule of regular examinations is right for you. This is a good chance for you to check in with your provider about disease prevention and staying healthy. In between checkups, there are plenty of things you can do on your own. Experts have done a lot of research about which lifestyle changes and preventive measures are most likely to keep you healthy. Ask your health care provider for more information. Weight and diet Eat a healthy diet  Be sure to include plenty of vegetables, fruits, low-fat dairy products, and lean protein.  Do not eat a lot of foods high in solid fats, added sugars, or salt.  Get regular exercise. This is one of the most important things you can do for your health. ? Most adults should exercise for at least 150 minutes each week. The exercise should increase your heart rate and make you sweat (moderate-intensity exercise). ? Most adults should also do strengthening exercises at least twice a week. This is in addition to the moderate-intensity exercise.  Maintain a healthy weight  Body mass index (BMI) is a measurement that can be used to identify possible weight problems. It estimates body fat based on height and weight. Your health care provider can help determine your BMI and help you achieve or maintain a healthy weight.  For females 30 years of age and older: ? A BMI below 18.5 is considered underweight. ? A BMI of 18.5 to 24.9 is normal. ? A BMI of 25 to 29.9 is considered overweight. ? A BMI of 30 and above is considered obese.  Watch levels of cholesterol and blood lipids  You should start having your blood tested for lipids  and cholesterol at 53 years of age, then have this test every 5 years.  You may need to have your cholesterol levels checked more often if: ? Your lipid or cholesterol levels are high. ? You are older than 53 years of age. ? You are at high risk for heart disease.  Cancer screening Lung Cancer  Lung cancer screening is recommended for adults 2-63 years old who are at high risk for lung cancer because of a history of smoking.  A yearly low-dose CT scan of the lungs is recommended for people who: ? Currently smoke. ? Have quit within the past 15 years. ? Have at least a 30-pack-year history of smoking. A pack year is smoking an average of one pack of cigarettes a day for 1 year.  Yearly screening should continue until it has been 15 years since you quit.  Yearly screening should stop if you develop a health problem that would prevent you from having lung cancer treatment.  Breast Cancer  Practice breast self-awareness. This means understanding how your breasts normally appear and feel.  It also means doing regular breast self-exams. Let your health care provider know about any changes, no matter how small.  If you are in your 20s or 30s, you should have a clinical breast exam (CBE) by a health care provider every 1-3 years as part of a regular health exam.  If you are 37 or older, have a CBE every year. Also consider having a breast X-ray (mammogram) every year.  If you have a family history of breast cancer, talk to your health care provider about genetic screening.  If you are at high risk for breast cancer, talk to your health care provider about having an MRI and a mammogram every year.  Breast cancer gene (BRCA) assessment is recommended for women who have family members with BRCA-related cancers. BRCA-related cancers include: ? Breast. ? Ovarian. ? Tubal. ? Peritoneal cancers.  Results of the assessment will determine the need for genetic counseling and BRCA1 and BRCA2  testing.  Cervical Cancer Your health care provider may recommend that you be screened regularly for cancer of the pelvic organs (ovaries, uterus, and vagina). This screening involves a pelvic examination, including checking for microscopic changes to the surface of your cervix (Pap test). You may be encouraged to have this screening done every 3 years, beginning at age 21.  For women ages 30-65, health care providers may recommend pelvic exams and Pap testing every 3 years, or they may recommend the Pap and pelvic exam, combined with testing for human papilloma virus (HPV), every 5 years. Some types of HPV increase your risk of cervical cancer. Testing for HPV may also be done on women of any age with unclear Pap test results.  Other health care providers may not recommend any screening for nonpregnant women who are considered low risk for pelvic cancer and who do not have symptoms. Ask your health care provider if a screening pelvic exam is right for you.  If you have had past treatment for cervical cancer or a condition that could lead to cancer, you need Pap tests and screening for cancer for at least 20 years after your treatment. If Pap tests have been discontinued, your risk factors (such as having a new sexual partner) need to be reassessed to determine if screening should resume. Some women have medical problems that increase the chance of getting cervical cancer. In these cases, your health care provider may recommend more frequent screening and Pap tests.  Colorectal Cancer  This type of cancer can be detected and often prevented.  Routine colorectal cancer screening usually begins at 53 years of age and continues through 53 years of age.  Your health care provider may recommend screening at an earlier age if you have risk factors for colon cancer.  Your health care provider may also recommend using home test kits to check for hidden blood in the stool.  A small camera at the end of a  tube can be used to examine your colon directly (sigmoidoscopy or colonoscopy). This is done to check for the earliest forms of colorectal cancer.  Routine screening usually begins at age 50.  Direct examination of the colon should be repeated every 5-10 years through 53 years of age. However, you may need to be screened more often if early forms of precancerous polyps or small growths are found.  Skin Cancer  Check your skin from head to toe regularly.  Tell your health care provider about any new moles or changes in moles, especially if there is a change in a mole's shape or color.  Also tell your health care provider if you have a mole that is larger than the size of a pencil eraser.  Always use sunscreen. Apply sunscreen liberally and repeatedly throughout the day.  Protect yourself by wearing long sleeves, pants, a wide-brimmed hat, and sunglasses whenever you are outside.  Heart disease, diabetes, and high blood pressure  High blood pressure causes heart disease and   increases the risk of stroke. High blood pressure is more likely to develop in: ? People who have blood pressure in the high end of the normal range (130-139/85-89 mm Hg). ? People who are overweight or obese. ? People who are African American.  If you are 18-39 years of age, have your blood pressure checked every 3-5 years. If you are 40 years of age or older, have your blood pressure checked every year. You should have your blood pressure measured twice-once when you are at a hospital or clinic, and once when you are not at a hospital or clinic. Record the average of the two measurements. To check your blood pressure when you are not at a hospital or clinic, you can use: ? An automated blood pressure machine at a pharmacy. ? A home blood pressure monitor.  If you are between 55 years and 79 years old, ask your health care provider if you should take aspirin to prevent strokes.  Have regular diabetes screenings. This  involves taking a blood sample to check your fasting blood sugar level. ? If you are at a normal weight and have a low risk for diabetes, have this test once every three years after 53 years of age. ? If you are overweight and have a high risk for diabetes, consider being tested at a younger age or more often. Preventing infection Hepatitis B  If you have a higher risk for hepatitis B, you should be screened for this virus. You are considered at high risk for hepatitis B if: ? You were born in a country where hepatitis B is common. Ask your health care provider which countries are considered high risk. ? Your parents were born in a high-risk country, and you have not been immunized against hepatitis B (hepatitis B vaccine). ? You have HIV or AIDS. ? You use needles to inject street drugs. ? You live with someone who has hepatitis B. ? You have had sex with someone who has hepatitis B. ? You get hemodialysis treatment. ? You take certain medicines for conditions, including cancer, organ transplantation, and autoimmune conditions.  Hepatitis C  Blood testing is recommended for: ? Everyone born from 1945 through 1965. ? Anyone with known risk factors for hepatitis C.  Sexually transmitted infections (STIs)  You should be screened for sexually transmitted infections (STIs) including gonorrhea and chlamydia if: ? You are sexually active and are younger than 53 years of age. ? You are older than 53 years of age and your health care provider tells you that you are at risk for this type of infection. ? Your sexual activity has changed since you were last screened and you are at an increased risk for chlamydia or gonorrhea. Ask your health care provider if you are at risk.  If you do not have HIV, but are at risk, it may be recommended that you take a prescription medicine daily to prevent HIV infection. This is called pre-exposure prophylaxis (PrEP). You are considered at risk if: ? You are  sexually active and do not regularly use condoms or know the HIV status of your partner(s). ? You take drugs by injection. ? You are sexually active with a partner who has HIV.  Talk with your health care provider about whether you are at high risk of being infected with HIV. If you choose to begin PrEP, you should first be tested for HIV. You should then be tested every 3 months for as long as you are taking   PrEP. Pregnancy  If you are premenopausal and you may become pregnant, ask your health care provider about preconception counseling.  If you may become pregnant, take 400 to 800 micrograms (mcg) of folic acid every day.  If you want to prevent pregnancy, talk to your health care provider about birth control (contraception). Osteoporosis and menopause  Osteoporosis is a disease in which the bones lose minerals and strength with aging. This can result in serious bone fractures. Your risk for osteoporosis can be identified using a bone density scan.  If you are 104 years of age or older, or if you are at risk for osteoporosis and fractures, ask your health care provider if you should be screened.  Ask your health care provider whether you should take a calcium or vitamin D supplement to lower your risk for osteoporosis.  Menopause may have certain physical symptoms and risks.  Hormone replacement therapy may reduce some of these symptoms and risks. Talk to your health care provider about whether hormone replacement therapy is right for you. Follow these instructions at home:  Schedule regular health, dental, and eye exams.  Stay current with your immunizations.  Do not use any tobacco products including cigarettes, chewing tobacco, or electronic cigarettes.  If you are pregnant, do not drink alcohol.  If you are breastfeeding, limit how much and how often you drink alcohol.  Limit alcohol intake to no more than 1 drink per day for nonpregnant women. One drink equals 12 ounces of  beer, 5 ounces of wine, or 1 ounces of hard liquor.  Do not use street drugs.  Do not share needles.  Ask your health care provider for help if you need support or information about quitting drugs.  Tell your health care provider if you often feel depressed.  Tell your health care provider if you have ever been abused or do not feel safe at home. This information is not intended to replace advice given to you by your health care provider. Make sure you discuss any questions you have with your health care provider. Document Released: 02/01/2011 Document Revised: 12/25/2015 Document Reviewed: 04/22/2015 Elsevier Interactive Patient Education  [employer].

## 2017-12-16 NOTE — Assessment & Plan Note (Addendum)
No signs of recurrence. Appreciate onc and thoracic surgery care.

## 2017-12-16 NOTE — Assessment & Plan Note (Signed)
Preventative protocols reviewed and updated unless pt declined. Discussed healthy diet and lifestyle.  

## 2018-01-18 ENCOUNTER — Inpatient Hospital Stay: Payer: BLUE CROSS/BLUE SHIELD | Attending: Internal Medicine

## 2018-01-18 ENCOUNTER — Ambulatory Visit (INDEPENDENT_AMBULATORY_CARE_PROVIDER_SITE_OTHER): Payer: BLUE CROSS/BLUE SHIELD

## 2018-01-18 ENCOUNTER — Ambulatory Visit: Payer: BLUE CROSS/BLUE SHIELD | Admitting: Podiatry

## 2018-01-18 ENCOUNTER — Encounter: Payer: Self-pay | Admitting: Podiatry

## 2018-01-18 ENCOUNTER — Ambulatory Visit (HOSPITAL_COMMUNITY)
Admission: RE | Admit: 2018-01-18 | Discharge: 2018-01-18 | Disposition: A | Discharge status: Home / Self Care | Payer: BLUE CROSS/BLUE SHIELD | Source: Ambulatory Visit | Attending: Internal Medicine | Admitting: Internal Medicine

## 2018-01-18 DIAGNOSIS — Z902 Acquired absence of lung [part of]: Secondary | ICD-10-CM | POA: Diagnosis not present

## 2018-01-18 DIAGNOSIS — M7752 Other enthesopathy of left foot: Secondary | ICD-10-CM

## 2018-01-18 DIAGNOSIS — M722 Plantar fascial fibromatosis: Secondary | ICD-10-CM

## 2018-01-18 DIAGNOSIS — Z08 Encounter for follow-up examination after completed treatment for malignant neoplasm: Secondary | ICD-10-CM | POA: Diagnosis not present

## 2018-01-18 DIAGNOSIS — C3411 Malignant neoplasm of upper lobe, right bronchus or lung: Secondary | ICD-10-CM | POA: Insufficient documentation

## 2018-01-18 DIAGNOSIS — C3490 Malignant neoplasm of unspecified part of unspecified bronchus or lung: Secondary | ICD-10-CM | POA: Diagnosis not present

## 2018-01-18 DIAGNOSIS — M779 Enthesopathy, unspecified: Secondary | ICD-10-CM | POA: Diagnosis not present

## 2018-01-18 LAB — COMPREHENSIVE METABOLIC PANEL
ALBUMIN: 4.3 g/dL (ref 3.5–5.0)
ALK PHOS: 67 U/L (ref 40–150)
ALT: 10 U/L (ref 0–55)
ANION GAP: 7 (ref 3–11)
AST: 15 U/L (ref 5–34)
BUN: 14 mg/dL (ref 7–26)
CO2: 28 mmol/L (ref 22–29)
Calcium: 9.6 mg/dL (ref 8.4–10.4)
Chloride: 105 mmol/L (ref 98–109)
Creatinine, Ser: 0.78 mg/dL (ref 0.60–1.10)
GFR calc Af Amer: >60 mL/min (ref >60)
GFR calc non Af Amer: >60 mL/min (ref >60)
GLUCOSE: 83 mg/dL (ref 70–140)
POTASSIUM: 4.2 mmol/L (ref 3.5–5.1)
SODIUM: 140 mmol/L (ref 136–145)
Total Bilirubin: 1.5 mg/dL — ABNORMAL HIGH (ref 0.2–1.2)
Total Protein: 7.3 g/dL (ref 6.4–8.3)

## 2018-01-18 LAB — CBC WITH DIFFERENTIAL/PLATELET
BASOS PCT: 1 %
Basophils Absolute: 0 10*3/uL (ref 0.0–0.1)
Eosinophils Absolute: 0.1 10*3/uL (ref 0.0–0.5)
Eosinophils Relative: 1 %
HCT: 38.5 % (ref 34.8–46.6)
HEMOGLOBIN: 13.1 g/dL (ref 11.6–15.9)
Lymphocytes Relative: 18 %
Lymphs Abs: 1.5 10*3/uL (ref 0.9–3.3)
MCH: 27.6 pg (ref 25.1–34.0)
MCHC: 34 g/dL (ref 31.5–36.0)
MCV: 81.1 fL (ref 79.5–101.0)
MONOS PCT: 5 %
Monocytes Absolute: 0.4 10*3/uL (ref 0.1–0.9)
NEUTROS PCT: 75 %
Neutro Abs: 6.5 10*3/uL (ref 1.5–6.5)
Platelets: 241 10*3/uL (ref 145–400)
RBC: 4.75 MIL/uL (ref 3.70–5.45)
RDW: 13.7 % (ref 11.2–14.5)
WBC: 8.5 10*3/uL (ref 3.9–10.3)

## 2018-01-18 MED ORDER — IOHEXOL 300 MG/ML  SOLN
75.0000 mL | Freq: Once | INTRAMUSCULAR | Status: AC | PRN
  Administered 2018-01-18: 75 mL via INTRAVENOUS

## 2018-01-18 MED ORDER — DICLOFENAC SODIUM 75 MG PO TBEC: 75.0000 mg | DELAYED_RELEASE_TABLET | Freq: Two times a day (BID) | ORAL | 2 refills | Status: DC

## 2018-01-18 MED ORDER — TRIAMCINOLONE ACETONIDE 10 MG/ML IJ SUSP
10.0000 mg | Freq: Once | INTRAMUSCULAR | Status: AC
  Administered 2018-01-18: 10 mg

## 2018-01-18 NOTE — Patient Instructions (Addendum)

## 2018-01-18 NOTE — Progress Notes (Signed)
Subjective:   Patient ID: Monica [name], female   DOB: 52 y.o.   MRN: [id number]   HPI Patient presents with severe discomfort plantar aspect left heel that bothered her for a while and is worsened over the last month to the point she has trouble bearing plantar weight on the heel itself.  States is gradually become more aggravating over this period of time and is worse when she gets up in the morning.  Patient does not smoke and likes to be active   Review of Systems  All other systems reviewed and are negative.       Objective:  Physical Exam  Constitutional: She appears well-developed and well-nourished.  Cardiovascular: Intact distal pulses.  Pulmonary/Chest: Effort normal.  Musculoskeletal: Normal range of motion.  Neurological: She is alert.  Skin: Skin is warm.  Nursing note and vitals reviewed.   Neurovascular status intact muscle strength is adequate range of motion within normal limits with exquisite discomfort plantar aspect left heel the insertional point of the tendon into the calcaneus with inflammation and fluid around the medial band at its insertional point     Assessment:  Acute plantar fasciitis left with inflammation fluid around the medial band     Plan:  H&P condition reviewed and today I went ahead and injected the plantar fascia 3 mg Kenalog 5 mg Xylocaine applied fascial brace and gave instructions for physical therapy.  Patient will be seen back to recheck 2 weeks or earlier if necessary and placed on diclofenac 75 mg twice daily  X-ray indicates large spur with no indications of stress fracture or arthritis

## 2018-01-25 ENCOUNTER — Inpatient Hospital Stay (HOSPITAL_BASED_OUTPATIENT_CLINIC_OR_DEPARTMENT_OTHER): Payer: BLUE CROSS/BLUE SHIELD | Admitting: Internal Medicine

## 2018-01-25 ENCOUNTER — Other Ambulatory Visit: Payer: BLUE CROSS/BLUE SHIELD

## 2018-01-25 ENCOUNTER — Telehealth: Payer: Self-pay | Admitting: Internal Medicine

## 2018-01-25 ENCOUNTER — Encounter: Payer: Self-pay | Admitting: Internal Medicine

## 2018-01-25 VITALS — BP 131/90 | HR 83 | Temp 98.3°F | Resp 17 | Ht 62.0 in | Wt 189.6 lb

## 2018-01-25 DIAGNOSIS — C3411 Malignant neoplasm of upper lobe, right bronchus or lung: Secondary | ICD-10-CM

## 2018-01-25 DIAGNOSIS — E039 Hypothyroidism, unspecified: Secondary | ICD-10-CM

## 2018-01-25 DIAGNOSIS — C3490 Malignant neoplasm of unspecified part of unspecified bronchus or lung: Secondary | ICD-10-CM

## 2018-01-25 NOTE — Telephone Encounter (Signed)
Scheduled appt per 6/26 los - Gave patient aVS and calender per los.

## 2018-01-25 NOTE — Progress Notes (Signed)
[facility] [phone]   [phone]  OFFICE PROGRESS NOTE  Monica Bush, MD [place] [address]  DIAGNOSIS: Stage IA (T1a, N0, M0) non-small cell lung cancer, adenocarcinoma presented with right upper lobe pulmonary nodule diagnosed in November 2017.  PRIOR THERAPY:status post right upper lobectomy with lymph node dissection in November 2017 under the care of Dr. Roxan [name].  CURRENT THERAPY: Observation.  INTERVAL HISTORY: Monica [name] 52 y.o. female returns to the clinic today for follow-up visit.  The patient is feeling fine today with no specific complaints.  She intentionally lost around 12 pounds since her last visit.  She eats good and exercise regularly.  She denied having any chest pain, shortness of breath, cough or hemoptysis.  She denied having any nausea, vomiting, diarrhea or constipation.  She has no fever or chills.  She was seen by orthopedic surgeon recently for plantar fasciitis.  The patient had a repeat CT scan of the chest performed recently and she is here for evaluation and discussion of her risk her results.   MEDICAL HISTORY: Past Medical History:  Diagnosis Date  . Essential hypertension   . History of chicken pox   . Hyperbilirubinemia 01/19/2017  . Hyperlipidemia   . Hypothyroidism   . Primary adenocarcinoma of upper lobe of right lung ([place]) 05/28/2016   RUL apex spiculated ~2cm nodule concerning by PET scan - referred to multidisciplinary thoracic oncology clinic S/p thoracoscopic RULobectomy Monica [name]) 06/2016  . Sarcoidosis, lung ([place]) ~2004   treated with prendisone and stable since then    ALLERGIES:  has No Known Allergies.  MEDICATIONS:  Current Outpatient Medications  Medication Sig Dispense Refill  . Cholecalciferol (VITAMIN D-3) 1000 units CAPS Take 1,000 Units by mouth at bedtime.    . diclofenac (VOLTAREN) 75 MG EC tablet Take 1 tablet (75 mg total) by mouth 2  (two) times daily. 50 tablet 2  . fluticasone (FLONASE) 50 MCG/ACT nasal spray Place 1 spray into both nostrils daily as needed for allergies. 16 g 0  . hydrochlorothiazide (HYDRODIURIL) 25 MG tablet Take 1 tablet (25 mg total) by mouth daily. 90 tablet 3  . ibuprofen (ADVIL,MOTRIN) 200 MG tablet Take 400 mg by mouth every 6 (six) hours as needed for mild pain.     Marland Kitchen levothyroxine (SYNTHROID) 150 MCG tablet Take 1 tablet (150 mcg total) by mouth daily before breakfast. 90 tablet 3  . losartan (COZAAR) 25 MG tablet TAKE 1 TABLET(25 MG) BY MOUTH DAILY 90 tablet 3  . pantoprazole (PROTONIX) 40 MG tablet Take 1 tablet (40 mg total) by mouth daily. 30 tablet 11  . valACYclovir (VALTREX) 1000 MG tablet Take 1,000 mg by mouth 2 (two) times daily as needed (fever blisters).     No current facility-administered medications for this visit.     SURGICAL HISTORY:  Past Surgical History:  Procedure Laterality Date  . CARDIOVASCULAR STRESS TEST  12/2016   Nuclear CP stress test: hypertensive response to exercise, normal low risk study, EF 68%  . COLONOSCOPY  09/2015   WNL Monica [name])  . ENDOMETRIAL ABLATION W/ NOVASURE  01/2009   Archie Endo 12/02/2010  . LOBECTOMY Right 06/14/2016   RU Lobectomy; Surgeon: Monica Nakayama, MD  . LYMPH NODE DISSECTION Right 06/14/2016   RIGHT UPPER LOBE LYMPH NODE DISSECTION;  Surgeon: Monica Nakayama, MD  . [place] Bilateral 1998  . TUBAL LIGATION  2001  . VIDEO ASSISTED THORACOSCOPY (VATS)/WEDGE RESECTION  Right 06/14/2016   Surgeon: Monica Nakayama, MD    REVIEW OF SYSTEMS:  A comprehensive review of systems was negative.   PHYSICAL EXAMINATION: General appearance: alert, cooperative and no distress Head: Normocephalic, without obvious abnormality, atraumatic Neck: no adenopathy, no JVD, supple, symmetrical, trachea midline and thyroid not enlarged, symmetric, no tenderness/mass/nodules Lymph nodes: Cervical, supraclavicular, and axillary  nodes normal. Resp: clear to auscultation bilaterally Back: symmetric, no curvature. ROM normal. No CVA tenderness. Cardio: regular rate and rhythm, S1, S2 normal, no murmur, click, rub or gallop GI: soft, non-tender; bowel sounds normal; no masses,  no organomegaly Extremities: extremities normal, atraumatic, no cyanosis or edema  ECOG PERFORMANCE STATUS: 0 - Asymptomatic  Blood pressure 131/90, pulse 83, temperature 98.3 F (36.8 C), temperature source Oral, resp. rate 17, height 5\' 2"  (1.575 m), weight 189 lb 9.6 oz (86 kg), SpO2 100 %.  LABORATORY DATA: Lab Results  Component Value Date   WBC 8.5 01/18/2018   HGB 13.1 01/18/2018   HCT 38.5 01/18/2018   MCV 81.1 01/18/2018   PLT 241 01/18/2018      Chemistry      Component Value Date/Time   NA 140 01/18/2018 1335   NA 140 07/20/2017 1338   K 4.2 01/18/2018 1335   K 4.1 07/20/2017 1338   CL 105 01/18/2018 1335   CO2 28 01/18/2018 1335   CO2 26 07/20/2017 1338   BUN 14 01/18/2018 1335   BUN 13.4 07/20/2017 1338   CREATININE 0.78 01/18/2018 1335   CREATININE 0.8 07/20/2017 1338      Component Value Date/Time   CALCIUM 9.6 01/18/2018 1335   CALCIUM 9.7 07/20/2017 1338   ALKPHOS 67 01/18/2018 1335   ALKPHOS 77 07/20/2017 1338   AST 15 01/18/2018 1335   AST 15 07/20/2017 1338   ALT 10 01/18/2018 1335   ALT 12 07/20/2017 1338   BILITOT 1.5 (H) 01/18/2018 1335   BILITOT 1.71 (H) 07/20/2017 1338       RADIOGRAPHIC STUDIES: Ct Chest W Contrast  Result Date: 01/19/2018 CLINICAL DATA:  Follow-up lung cancer. Status post right upper lobectomy. EXAM: CT CHEST WITH CONTRAST TECHNIQUE: Multidetector CT imaging of the chest was performed during intravenous contrast administration. CONTRAST:  73mL OMNIPAQUE IOHEXOL 300 MG/ML  SOLN COMPARISON:  07/20/2017 FINDINGS: Cardiovascular: The heart size appears normal. No pericardial effusion. Mediastinum/Nodes: No mediastinal adenopathy. Right paratracheal lymph node is unchanged at 1  cm, image 59/2. No supraclavicular or axillary adenopathy. Thyroid gland, trachea, and esophagus demonstrate no significant findings. Lungs/Pleura: Stable postoperative change from right upper lobectomy. There are scattered small ground-glass attenuating nodules identified bilaterally scattered throughout both lungs. These appear similar to the previous exam. Several scattered solid nodules are also again noted and appears similar to previous exam. For example, superior segment left lower lobe lung nodule is unchanged measuring 4 mm, image 58/10. Stable 4 mm left apical nodule, image 20/10. 3 mm left lower lobe lung nodule is also unchanged, image 79/10. Upper Abdomen: No acute abnormality. Musculoskeletal: No chest wall abnormality. No acute or significant osseous findings. IMPRESSION: 1. Stable exam. Status post right upper lobectomy. No new or progressive findings. 2. Scattered small ground-glass and solid nodules are not significantly changed in the interval. Continued attention on follow-up imaging is recommended. Electronically Signed   By: Kerby Moors M.D.   On: 01/19/2018 07:33   Dg Foot 2 Views Left  Result Date: 01/18/2018 Please see detailed radiograph report in office note.   ASSESSMENT AND PLAN: This  is a very pleasant 52 years old white female with stage IA non-small cell lung cancer, adenocarcinoma presented with right upper lobe pulmonary nodule is status post right upper lobectomy with lymph node dissection in November 2017 under the care of Dr. Roxan [name]. The patient has been on observation since that time. Repeat CT scan of the chest performed recently showed no concerning findings for disease progression. I discussed the scan results with the patient and recommended for her to continue on observation with repeat CT scan of the chest in 6 months for restaging of her disease. She was advised to call immediately if she has any concerning symptoms in the interval. The patient voices  understanding of current disease status and treatment options and is in agreement with the current care plan. All questions were answered. The patient knows to call the clinic with any problems, questions or concerns. We can certainly see the patient much sooner if necessary. I spent 10 minutes counseling the patient face to face. The total time spent in the appointment was 15 minutes. Disclaimer: This note was dictated with voice recognition software. Similar sounding words can inadvertently be transcribed and may not be corrected upon review.

## 2018-02-01 ENCOUNTER — Ambulatory Visit: Payer: BLUE CROSS/BLUE SHIELD | Admitting: Internal Medicine

## 2018-02-10 ENCOUNTER — Ambulatory Visit: Payer: BLUE CROSS/BLUE SHIELD | Admitting: Podiatry

## 2018-02-17 ENCOUNTER — Ambulatory Visit: Payer: BLUE CROSS/BLUE SHIELD | Admitting: Podiatry

## 2018-02-17 ENCOUNTER — Encounter: Payer: Self-pay | Admitting: Podiatry

## 2018-02-17 DIAGNOSIS — M722 Plantar fascial fibromatosis: Secondary | ICD-10-CM | POA: Diagnosis not present

## 2018-02-17 MED ORDER — TRIAMCINOLONE ACETONIDE 10 MG/ML IJ SUSP
10.0000 mg | Freq: Once | INTRAMUSCULAR | Status: AC
  Administered 2018-02-17: 10 mg

## 2018-02-20 NOTE — Progress Notes (Signed)
Subjective:   Patient ID: Monica [name], female   DOB: 52 y.o.   MRN: [id number]   HPI Patient presents stating that at times her heel feels better but at times it seems like there is no progress and she feels like she is walking on the outside of her foot.  States her pain is worse when she gets up in the morning and after any periods of sitting   ROS      Objective:  Physical Exam  Neurovascular status intact with patient noted to have exquisite discomfort still in the medial central band of the plantar fascial left at its insertion into the calcaneus with moderate discomfort extending into the arch     Assessment:  Continued plantar fasciitis left with inflammation fluid around the medial band     Plan:  H&P condition reviewed and recommended physical therapy anti-inflammatories and I did dispense a night splint in order to completely stretch the heel and to provide for a good environment for ice.  I did reinject the plantar fascia today 3 mg Kenalog 5 mg Xylocaine and patient will be seen back again in 4 weeks

## 2018-03-13 ENCOUNTER — Ambulatory Visit: Payer: BLUE CROSS/BLUE SHIELD | Admitting: Podiatry

## 2018-03-24 ENCOUNTER — Ambulatory Visit: Payer: BLUE CROSS/BLUE SHIELD | Admitting: Podiatry

## 2018-03-31 DIAGNOSIS — H10413 Chronic giant papillary conjunctivitis, bilateral: Secondary | ICD-10-CM | POA: Diagnosis not present

## 2018-05-22 DIAGNOSIS — M542 Cervicalgia: Secondary | ICD-10-CM | POA: Diagnosis not present

## 2018-05-22 DIAGNOSIS — M25512 Pain in left shoulder: Secondary | ICD-10-CM | POA: Diagnosis not present

## 2018-05-22 DIAGNOSIS — M546 Pain in thoracic spine: Secondary | ICD-10-CM | POA: Diagnosis not present

## 2018-05-22 DIAGNOSIS — M9901 Segmental and somatic dysfunction of cervical region: Secondary | ICD-10-CM | POA: Diagnosis not present

## 2018-05-23 DIAGNOSIS — M542 Cervicalgia: Secondary | ICD-10-CM | POA: Diagnosis not present

## 2018-05-23 DIAGNOSIS — M9901 Segmental and somatic dysfunction of cervical region: Secondary | ICD-10-CM | POA: Diagnosis not present

## 2018-05-23 DIAGNOSIS — M25512 Pain in left shoulder: Secondary | ICD-10-CM | POA: Diagnosis not present

## 2018-05-23 DIAGNOSIS — M546 Pain in thoracic spine: Secondary | ICD-10-CM | POA: Diagnosis not present

## 2018-05-24 DIAGNOSIS — M9901 Segmental and somatic dysfunction of cervical region: Secondary | ICD-10-CM | POA: Diagnosis not present

## 2018-05-24 DIAGNOSIS — M546 Pain in thoracic spine: Secondary | ICD-10-CM | POA: Diagnosis not present

## 2018-05-24 DIAGNOSIS — M25512 Pain in left shoulder: Secondary | ICD-10-CM | POA: Diagnosis not present

## 2018-05-24 DIAGNOSIS — M542 Cervicalgia: Secondary | ICD-10-CM | POA: Diagnosis not present

## 2018-05-26 DIAGNOSIS — M25522 Pain in left elbow: Secondary | ICD-10-CM | POA: Diagnosis not present

## 2018-05-30 DIAGNOSIS — M25512 Pain in left shoulder: Secondary | ICD-10-CM | POA: Diagnosis not present

## 2018-05-30 DIAGNOSIS — M9901 Segmental and somatic dysfunction of cervical region: Secondary | ICD-10-CM | POA: Diagnosis not present

## 2018-05-30 DIAGNOSIS — M542 Cervicalgia: Secondary | ICD-10-CM | POA: Diagnosis not present

## 2018-05-30 DIAGNOSIS — M546 Pain in thoracic spine: Secondary | ICD-10-CM | POA: Diagnosis not present

## 2018-05-31 DIAGNOSIS — M25522 Pain in left elbow: Secondary | ICD-10-CM | POA: Diagnosis not present

## 2018-05-31 DIAGNOSIS — M6281 Muscle weakness (generalized): Secondary | ICD-10-CM | POA: Diagnosis not present

## 2018-05-31 DIAGNOSIS — M7712 Lateral epicondylitis, left elbow: Secondary | ICD-10-CM | POA: Diagnosis not present

## 2018-06-02 IMAGING — CT NM PET TUM IMG INITIAL (PI) SKULL BASE T - THIGH
10 series · 25 of 25 positions shown · non-contrast
Comparison: CT chest 06/03/2016.

CLINICAL DATA: Initial treatment strategy for pulmonary nodule.

EXAM:
NUCLEAR MEDICINE PET SKULL BASE TO THIGH
TECHNIQUE: 12.6 mCi F-18 FDG was injected intravenously. Full-ring PET imaging
was performed from the skull base to thigh after the radiotracer. CT
data was obtained and used for attenuation correction and anatomic
localization.
FASTING BLOOD GLUCOSE:  Value: 113 mg/dl

[Series 3: ct wb 5.0 b30f · axial · 5.0mm · 0.98mm/px · z∈[-1420,-554]mm · 3 of 290 slices shown]
[im 1/290  brain]
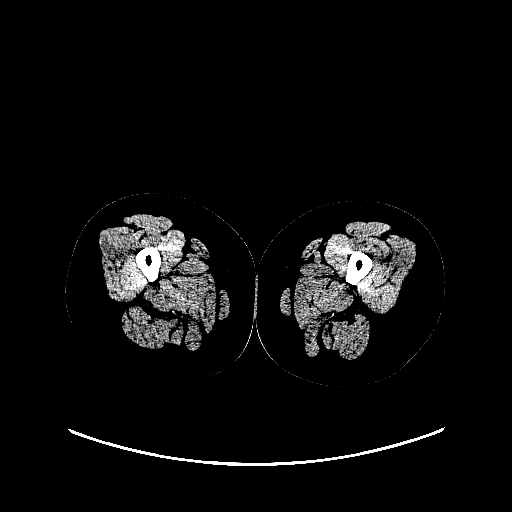
[im 145/290]
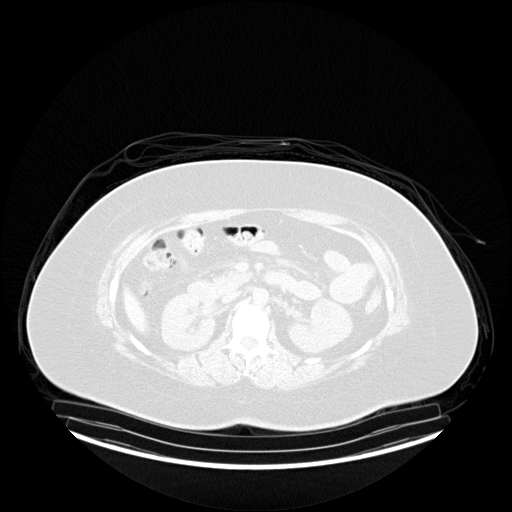
[im 290/290  brain]
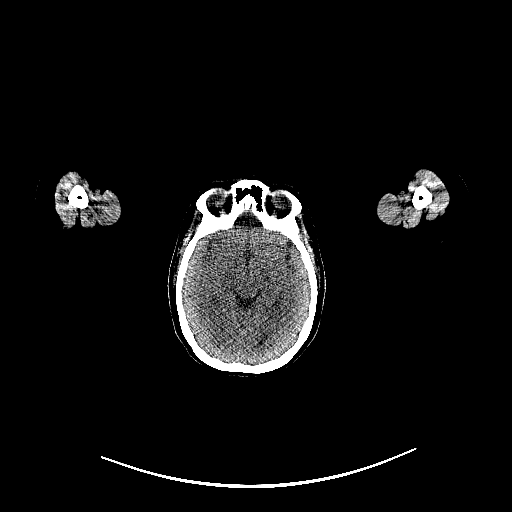

[Series 4: pet wb (ac) · axial · 5.0mm · 4.07mm/px · z∈[-1420,-554]mm · 3 of 290 slices shown]
[im 1/290]
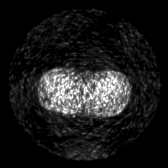
[im 145/290]
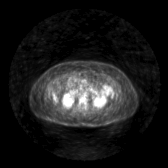
[im 290/290]
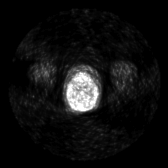

[Series 5: pet wb uncorrected (nac) · axial · 5.0mm · 4.07mm/px · z∈[-1420,-554]mm · 4 of 290 slices shown]
[im 1/290]
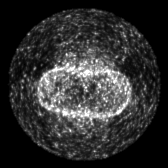
[im 97/290]
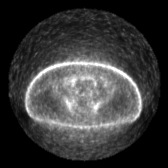
[im 193/290]
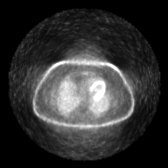
[im 290/290]
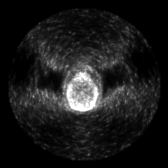

[Series 603: pet/ct axial · 4 of 289 slices shown]
[im 1/289]
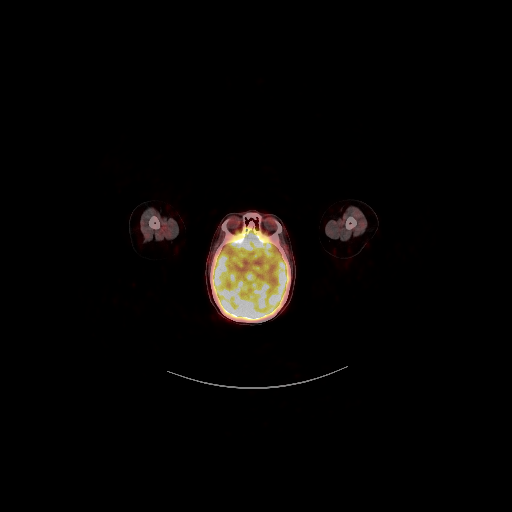
[im 97/289]
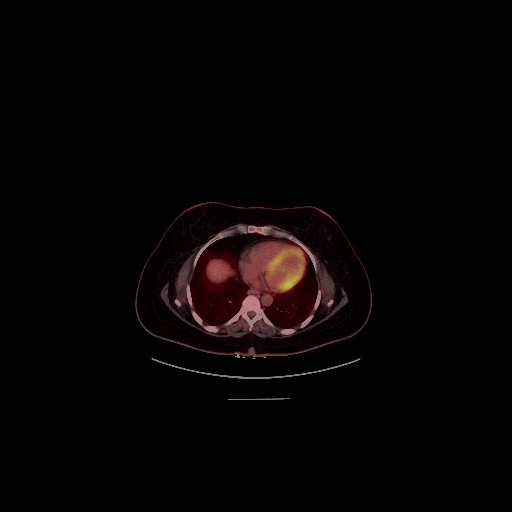
[im 193/289]
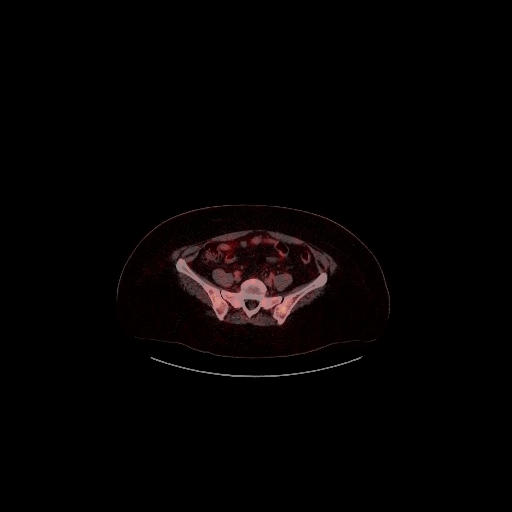
[im 289/289]
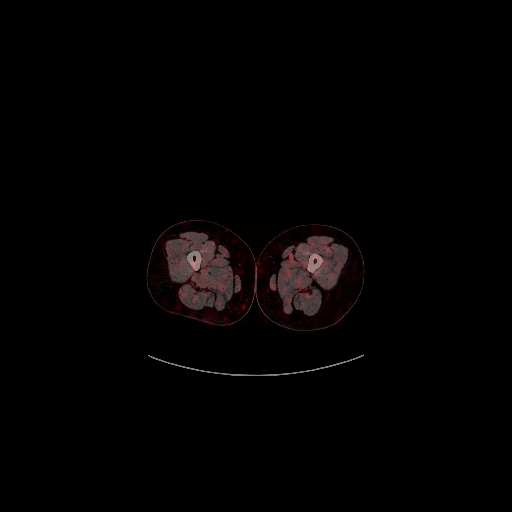

[Series 604: pet/ct coronal · 1 of 79 slices shown]
[im 1/79]
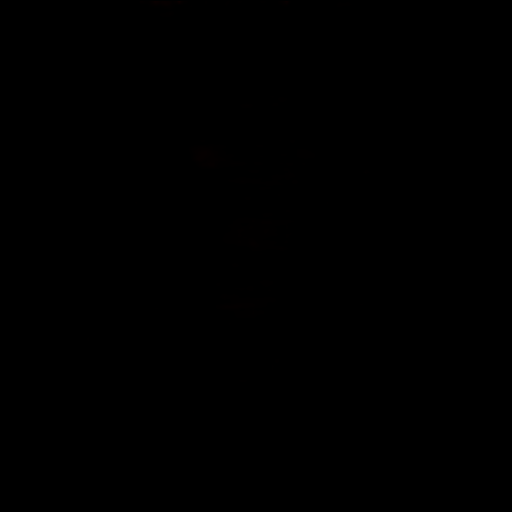

[Series 605: pet/ct sagittal · 2 of 136 slices shown]
[im 1/136]
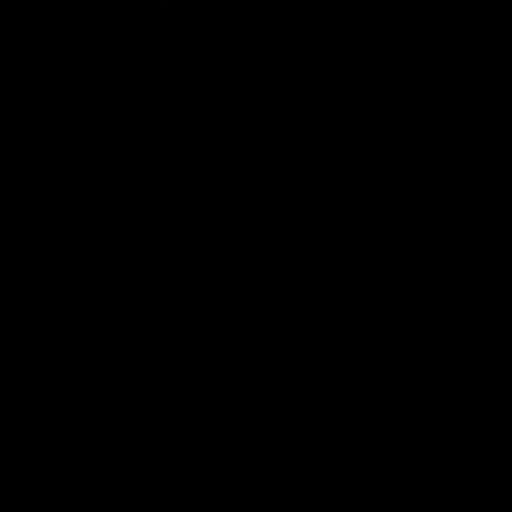
[im 136/136]
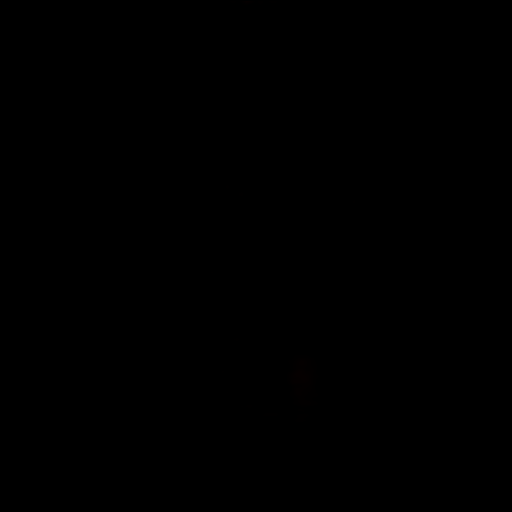

[Series 606: pet axial · 4 of 288 slices shown]
[im 1/288]
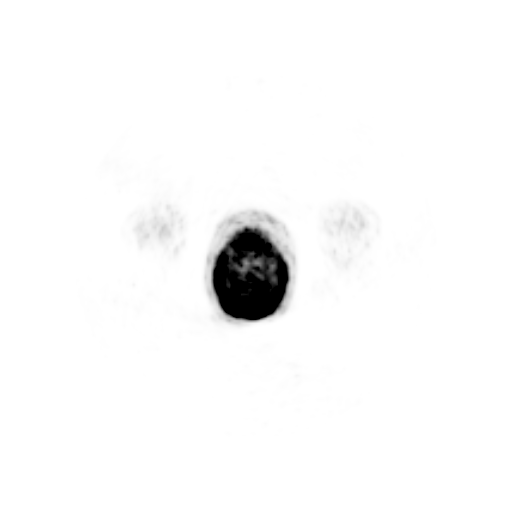
[im 96/288]
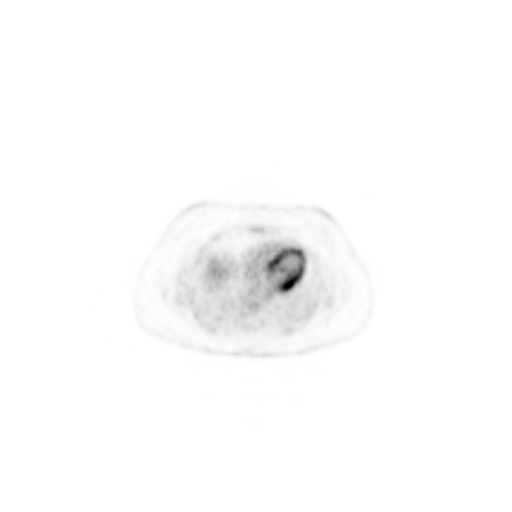
[im 192/288]
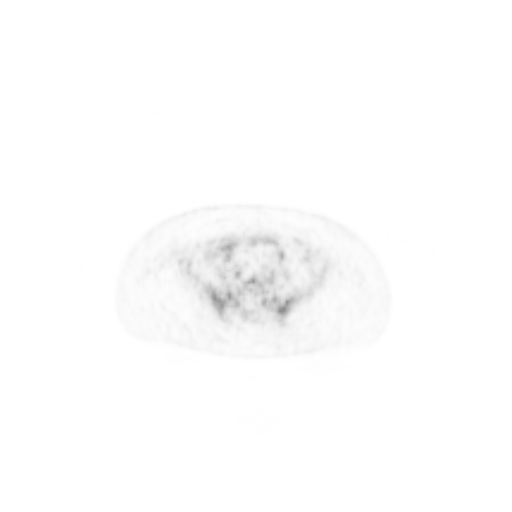
[im 288/288]
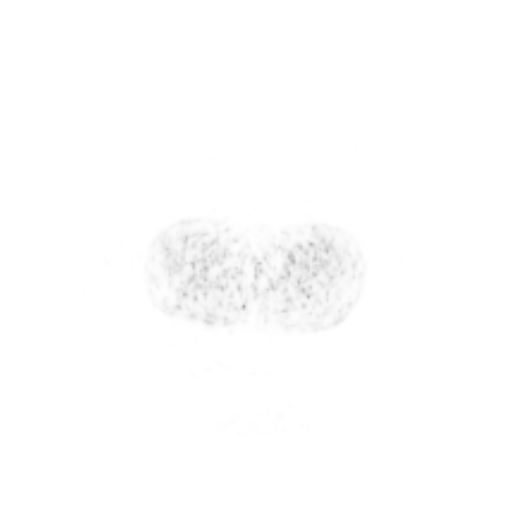

[Series 607: pet coronal · 1 of 98 slices shown]
[im 1/98]
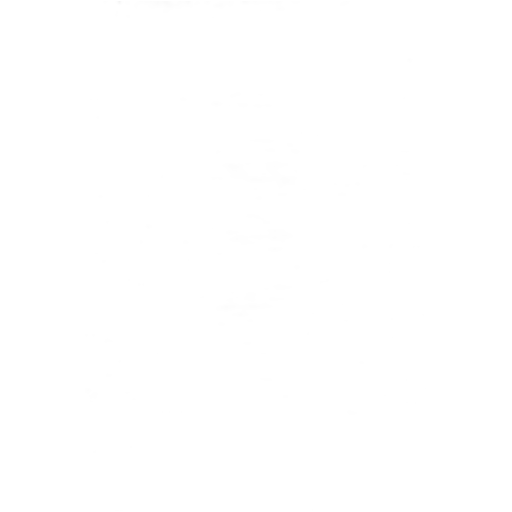

[Series 608: pet sagittal · 2 of 166 slices shown]
[im 1/166]
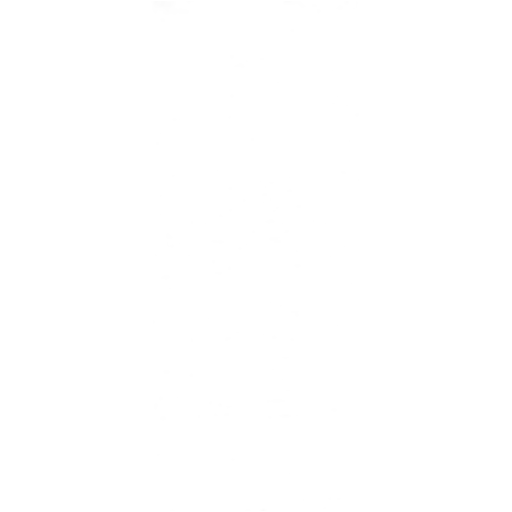
[im 166/166]
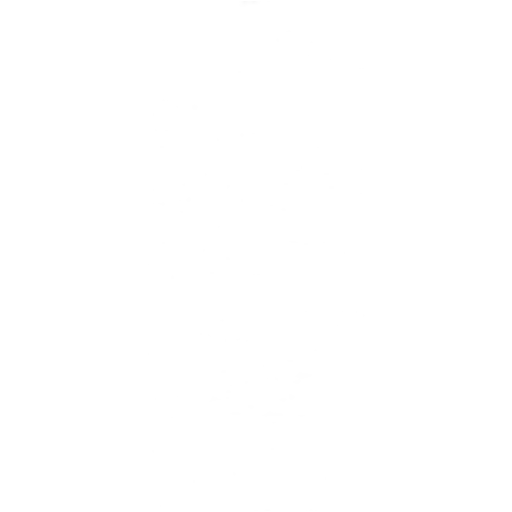

[Series 1038: results mm oncology reading · 0.89mm/px · 1 of 1 slices shown]
[im 1/1]
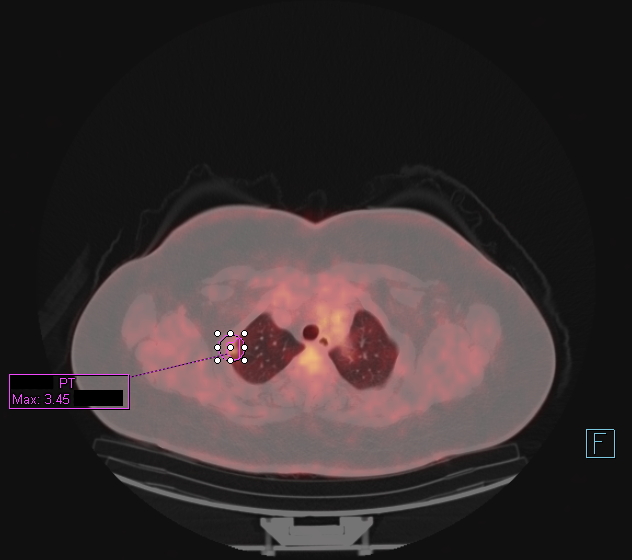

[25 of 25 positions shown; findings below may reference images not displayed]

FINDINGS: NECK

No hypermetabolic lymph nodes in the neck. CT images show no acute
findings.

CHEST

No hypermetabolic mediastinal, hilar or axillary lymph nodes. A 9 mm
spiculated nodule in the apical segment right upper lobe (CT image
69) has an SUV max of 3.5. No additional hypermetabolic pulmonary
nodules. No pericardial or pleural effusion.

ABDOMEN/PELVIS

No abnormal hypermetabolism in the liver, adrenal glands, spleen or
pancreas. No hypermetabolic lymph nodes. Sub cm low-attenuation
lesion in the dome of the liver is too small to characterize. Liver,
gallbladder, adrenal glands and right kidney are unremarkable.
cm low-attenuation lesion in the interpolar left kidney is difficult
to further characterize without post-contrast imaging. Spleen,
pancreas, stomach and bowel are grossly unremarkable. No free fluid.

SKELETON

No abnormal osseous hypermetabolism.
IMPRESSION: Malignant range hypermetabolism within a spiculated right upper lobe
nodule, highly worrisome for stage IA primary bronchogenic
carcinoma.

## 2018-06-09 DIAGNOSIS — M25522 Pain in left elbow: Secondary | ICD-10-CM | POA: Diagnosis not present

## 2018-06-09 DIAGNOSIS — M7712 Lateral epicondylitis, left elbow: Secondary | ICD-10-CM | POA: Diagnosis not present

## 2018-06-09 DIAGNOSIS — M6281 Muscle weakness (generalized): Secondary | ICD-10-CM | POA: Diagnosis not present

## 2018-06-14 DIAGNOSIS — M25522 Pain in left elbow: Secondary | ICD-10-CM | POA: Diagnosis not present

## 2018-06-14 DIAGNOSIS — M6281 Muscle weakness (generalized): Secondary | ICD-10-CM | POA: Diagnosis not present

## 2018-06-14 DIAGNOSIS — M7712 Lateral epicondylitis, left elbow: Secondary | ICD-10-CM | POA: Diagnosis not present

## 2018-06-19 ENCOUNTER — Other Ambulatory Visit: Payer: Self-pay | Admitting: Thoracic Surgery (Cardiothoracic Vascular Surgery)

## 2018-06-19 DIAGNOSIS — Z85118 Personal history of other malignant neoplasm of bronchus and lung: Secondary | ICD-10-CM

## 2018-06-20 ENCOUNTER — Encounter: Payer: Self-pay | Admitting: Thoracic Surgery (Cardiothoracic Vascular Surgery)

## 2018-06-20 ENCOUNTER — Ambulatory Visit
Admission: RE | Admit: 2018-06-20 | Discharge: 2018-06-20 | Disposition: A | Discharge status: Home / Self Care | Payer: BLUE CROSS/BLUE SHIELD | Source: Ambulatory Visit | Attending: Thoracic Surgery (Cardiothoracic Vascular Surgery) | Admitting: Thoracic Surgery (Cardiothoracic Vascular Surgery)

## 2018-06-20 ENCOUNTER — Other Ambulatory Visit: Payer: Self-pay

## 2018-06-20 ENCOUNTER — Encounter: Payer: BLUE CROSS/BLUE SHIELD | Admitting: Thoracic Surgery (Cardiothoracic Vascular Surgery)

## 2018-06-20 ENCOUNTER — Ambulatory Visit: Payer: BLUE CROSS/BLUE SHIELD | Admitting: Thoracic Surgery (Cardiothoracic Vascular Surgery)

## 2018-06-20 VITALS — BP 128/76 | HR 80 | Ht 62.0 in | Wt 188.0 lb

## 2018-06-20 DIAGNOSIS — M542 Cervicalgia: Secondary | ICD-10-CM | POA: Diagnosis not present

## 2018-06-20 DIAGNOSIS — Z902 Acquired absence of lung [part of]: Secondary | ICD-10-CM | POA: Diagnosis not present

## 2018-06-20 DIAGNOSIS — C3411 Malignant neoplasm of upper lobe, right bronchus or lung: Secondary | ICD-10-CM

## 2018-06-20 DIAGNOSIS — Z85118 Personal history of other malignant neoplasm of bronchus and lung: Secondary | ICD-10-CM

## 2018-06-20 NOTE — Progress Notes (Signed)
[address]       [place] [address]             [phone]    HPI: Monica [name] returns for a scheduled follow-up visit  Monica [name] is a 52 year old woman who had a thoracoscopic right upper lobectomy for stage IA adenocarcinoma in November 2017.  She is a lifelong non-smoker.  She did not require adjuvant therapy.  She is been on observation since her surgery.  I last saw her in the office a year ago.  She was doing well at that time but was complaining of a persistent cough.  In the interim since her last visit her cough more or less resolved over the summer but she has noticed it coming back over the past couple of weeks.  She is also had some sinus congestion.  Her appetite is good.  She has not had any significant weight loss.  She saw Dr. Julien Nordmann in June and sees him again in December.  Past Medical History:  Diagnosis Date  . Essential hypertension   . History of chicken pox   . Hyperbilirubinemia 01/19/2017  . Hyperlipidemia   . Hypothyroidism   . Primary adenocarcinoma of upper lobe of right lung ([place]) 05/28/2016   RUL apex spiculated ~2cm nodule concerning by PET scan - referred to multidisciplinary thoracic oncology clinic S/p thoracoscopic RULobectomy Roxan Hockey) 06/2016  . Sarcoidosis, lung ([place]) ~2004   treated with prendisone and stable since then    Current Outpatient Medications  Medication Sig Dispense Refill  . Cholecalciferol (VITAMIN D-3) 1000 units CAPS Take 1,000 Units by mouth at bedtime.    . fluticasone (FLONASE) 50 MCG/ACT nasal spray Place 1 spray into both nostrils daily as needed for allergies. 16 g 0  . hydrochlorothiazide (HYDRODIURIL) 25 MG tablet Take 1 tablet (25 mg total) by mouth daily. 90 tablet 3  . ibuprofen (ADVIL,MOTRIN) 200 MG tablet Take 400 mg by mouth every 6 (six) hours as needed for mild pain.     Marland Kitchen levothyroxine (SYNTHROID) 150 MCG tablet Take 1 tablet (150 mcg total) by mouth daily before  breakfast. 90 tablet 3  . losartan (COZAAR) 25 MG tablet TAKE 1 TABLET(25 MG) BY MOUTH DAILY 90 tablet 3  . meloxicam (MOBIC) 15 MG tablet Take 15 mg by mouth daily.    . valACYclovir (VALTREX) 1000 MG tablet Take 1,000 mg by mouth 2 (two) times daily as needed (fever blisters).     No current facility-administered medications for this visit.     Physical Exam BP 128/76 (BP Location: Right Arm, Patient Position: Sitting, Cuff Size: Large)   Pulse 80   Ht 5\' 2"  (1.575 m)   Wt 188 lb (85.3 kg)   SpO2 98% Comment: ON RA  BMI 34.98 kg/m  52 year old woman in no acute distress Alert and oriented x3 with no focal deficits No cervical or supraclavicular adenopathy Lungs diminished at right base, otherwise clear Cardiac regular rate and rhythm  Diagnostic Tests: CHEST - 2 VIEW  COMPARISON:  CT chest 01/18/2018, chest x-ray 12/30/2016  FINDINGS: There is chronic elevation of the right diaphragm. There are postsurgical changes in the right lung unchanged from the prior exams. No new focal parenchymal opacity. There is no focal parenchymal opacity. There is no pleural effusion or pneumothorax. The heart and mediastinal contours are unremarkable.  The osseous structures are unremarkable.  IMPRESSION: No acute cardiopulmonary disease.   Electronically Signed   By: Kathreen Devoid  On: 06/20/2018 12:11  Impression: Monica [name] is a 52 year old non-smoker who had a stage IA adenocarcinoma treated with a right upper lobectomy in 2017.  She is now 2 years out from surgery with no evidence of recurrent disease.  She will continue to be followed by Dr. Julien Nordmann.  She is scheduled to see him to have a CT scan in December.  He does not have any lingering effects from her surgery.  Plan: Follow-up as scheduled with Dr. Julien Nordmann.  I will be happy to see her back again anytime the future if I can be of any further assistance with her care  Melrose Nakayama, MD [facility] [phone]

## 2018-07-10 DIAGNOSIS — M4722 Other spondylosis with radiculopathy, cervical region: Secondary | ICD-10-CM | POA: Diagnosis not present

## 2018-07-10 DIAGNOSIS — M7712 Lateral epicondylitis, left elbow: Secondary | ICD-10-CM | POA: Diagnosis not present

## 2018-07-10 DIAGNOSIS — M6281 Muscle weakness (generalized): Secondary | ICD-10-CM | POA: Diagnosis not present

## 2018-07-10 DIAGNOSIS — M25522 Pain in left elbow: Secondary | ICD-10-CM | POA: Diagnosis not present

## 2018-07-19 DIAGNOSIS — M25522 Pain in left elbow: Secondary | ICD-10-CM | POA: Diagnosis not present

## 2018-07-19 DIAGNOSIS — M7712 Lateral epicondylitis, left elbow: Secondary | ICD-10-CM | POA: Diagnosis not present

## 2018-07-19 DIAGNOSIS — M4722 Other spondylosis with radiculopathy, cervical region: Secondary | ICD-10-CM | POA: Diagnosis not present

## 2018-07-19 DIAGNOSIS — M6281 Muscle weakness (generalized): Secondary | ICD-10-CM | POA: Diagnosis not present

## 2018-07-28 ENCOUNTER — Ambulatory Visit (HOSPITAL_COMMUNITY)
Admission: RE | Admit: 2018-07-28 | Discharge: 2018-07-28 | Disposition: A | Discharge status: Home / Self Care | Payer: BLUE CROSS/BLUE SHIELD | Source: Ambulatory Visit | Attending: Internal Medicine | Admitting: Internal Medicine

## 2018-07-28 ENCOUNTER — Inpatient Hospital Stay: Payer: BLUE CROSS/BLUE SHIELD | Attending: Internal Medicine

## 2018-07-28 DIAGNOSIS — Z85118 Personal history of other malignant neoplasm of bronchus and lung: Secondary | ICD-10-CM | POA: Insufficient documentation

## 2018-07-28 DIAGNOSIS — Z79899 Other long term (current) drug therapy: Secondary | ICD-10-CM | POA: Insufficient documentation

## 2018-07-28 DIAGNOSIS — C3490 Malignant neoplasm of unspecified part of unspecified bronchus or lung: Secondary | ICD-10-CM

## 2018-07-28 DIAGNOSIS — C3491 Malignant neoplasm of unspecified part of right bronchus or lung: Secondary | ICD-10-CM | POA: Diagnosis not present

## 2018-07-28 LAB — CMP (CANCER CENTER ONLY)
ALK PHOS: 70 U/L (ref 38–126)
ALT: 16 U/L (ref 0–44)
ANION GAP: 7 (ref 5–15)
AST: 15 U/L (ref 15–41)
Albumin: 3.6 g/dL (ref 3.5–5.0)
BILIRUBIN TOTAL: 1.6 mg/dL — AB (ref 0.3–1.2)
BUN: 9 mg/dL (ref 6–20)
CALCIUM: 9.3 mg/dL (ref 8.9–10.3)
CO2: 30 mmol/L (ref 22–32)
Chloride: 105 mmol/L (ref 98–111)
Creatinine: 0.78 mg/dL (ref 0.44–1.00)
Glucose, Bld: 94 mg/dL (ref 70–99)
Potassium: 4.7 mmol/L (ref 3.5–5.1)
Sodium: 142 mmol/L (ref 135–145)
TOTAL PROTEIN: 7.1 g/dL (ref 6.5–8.1)

## 2018-07-28 LAB — CBC WITH DIFFERENTIAL (CANCER CENTER ONLY)
Abs Immature Granulocytes: 0.03 10*3/uL (ref 0.00–0.07)
BASOS PCT: 1 %
Basophils Absolute: 0.1 10*3/uL (ref 0.0–0.1)
EOS ABS: 0.2 10*3/uL (ref 0.0–0.5)
Eosinophils Relative: 2 %
HCT: 39.9 % (ref 36.0–46.0)
Hemoglobin: 13.2 g/dL (ref 12.0–15.0)
IMMATURE GRANULOCYTES: 0 %
Lymphocytes Relative: 25 %
Lymphs Abs: 2 10*3/uL (ref 0.7–4.0)
MCH: 27.4 pg (ref 26.0–34.0)
MCHC: 33.1 g/dL (ref 30.0–36.0)
MCV: 82.8 fL (ref 80.0–100.0)
MONOS PCT: 5 %
Monocytes Absolute: 0.4 10*3/uL (ref 0.1–1.0)
NRBC: 0 % (ref 0.0–0.2)
Neutro Abs: 5.6 10*3/uL (ref 1.7–7.7)
Neutrophils Relative %: 67 %
PLATELETS: 265 10*3/uL (ref 150–400)
RBC: 4.82 MIL/uL (ref 3.87–5.11)
RDW: 13 % (ref 11.5–15.5)
WBC: 8.3 10*3/uL (ref 4.0–10.5)

## 2018-07-28 MED ORDER — SODIUM CHLORIDE (PF) 0.9 % IJ SOLN
INTRAMUSCULAR | Status: AC
  Filled 2018-07-28: qty 50

## 2018-07-28 MED ORDER — IOHEXOL 300 MG/ML  SOLN
75.0000 mL | Freq: Once | INTRAMUSCULAR | Status: AC | PRN
  Administered 2018-07-28: 75 mL via INTRAVENOUS

## 2018-07-31 ENCOUNTER — Telehealth: Payer: Self-pay | Admitting: Internal Medicine

## 2018-07-31 ENCOUNTER — Encounter: Payer: Self-pay | Admitting: Internal Medicine

## 2018-07-31 ENCOUNTER — Inpatient Hospital Stay: Payer: BLUE CROSS/BLUE SHIELD | Admitting: Internal Medicine

## 2018-07-31 VITALS — BP 138/87 | HR 82 | Temp 98.9°F | Resp 17 | Ht 62.0 in | Wt 193.4 lb

## 2018-07-31 DIAGNOSIS — Z79899 Other long term (current) drug therapy: Secondary | ICD-10-CM | POA: Diagnosis not present

## 2018-07-31 DIAGNOSIS — C3490 Malignant neoplasm of unspecified part of unspecified bronchus or lung: Secondary | ICD-10-CM

## 2018-07-31 DIAGNOSIS — I10 Essential (primary) hypertension: Secondary | ICD-10-CM

## 2018-07-31 DIAGNOSIS — Z85118 Personal history of other malignant neoplasm of bronchus and lung: Secondary | ICD-10-CM

## 2018-07-31 NOTE — Telephone Encounter (Signed)
Printed calendar and avs. °

## 2018-07-31 NOTE — Progress Notes (Signed)
[facility] [phone]   [phone]  OFFICE PROGRESS NOTE  [name], MD [place] [address]  DIAGNOSIS: Stage IA (T1a, N0, M0) non-small cell lung cancer, adenocarcinoma presented with right upper lobe pulmonary nodule diagnosed in November 2017.  PRIOR THERAPY:status post right upper lobectomy with lymph node dissection in November 2017 under the care of Dr. Roxan Hockey.  CURRENT THERAPY: Observation.  INTERVAL HISTORY: Monica [name] 52 y.o. female returns to the clinic today for 6 months follow-up visit.  The patient is feeling fine today with no concerning complaints.  She denied having any chest pain, shortness of breath, cough or hemoptysis.  She denied having any fever or chills.  She has no nausea, vomiting, diarrhea or constipation.  She has no headache or visual changes.  The patient had a repeat CT scan of the chest performed recently and she is here for evaluation and discussion of her risk her results.  MEDICAL HISTORY: Past Medical History:  Diagnosis Date  . Essential hypertension   . History of chicken pox   . Hyperbilirubinemia 01/19/2017  . Hyperlipidemia   . Hypothyroidism   . Primary adenocarcinoma of upper lobe of right lung ([place]) 05/28/2016   RUL apex spiculated ~2cm nodule concerning by PET scan - referred to multidisciplinary thoracic oncology clinic S/p thoracoscopic RULobectomy Roxan Hockey) 06/2016  . Sarcoidosis, lung ([place]) ~2004   treated with prendisone and stable since then    ALLERGIES:  has No Known Allergies.  MEDICATIONS:  Current Outpatient Medications  Medication Sig Dispense Refill  . Cholecalciferol (VITAMIN D-3) 1000 units CAPS Take 1,000 Units by mouth at bedtime.    . fluticasone (FLONASE) 50 MCG/ACT nasal spray Place 1 spray into both nostrils daily as needed for allergies. 16 g 0  . hydrochlorothiazide (HYDRODIURIL) 25 MG tablet Take 1 tablet (25 mg total) by  mouth daily. 90 tablet 3  . ibuprofen (ADVIL,MOTRIN) 200 MG tablet Take 400 mg by mouth every 6 (six) hours as needed for mild pain.     Marland Kitchen levothyroxine (SYNTHROID) 150 MCG tablet Take 1 tablet (150 mcg total) by mouth daily before breakfast. 90 tablet 3  . losartan (COZAAR) 25 MG tablet TAKE 1 TABLET(25 MG) BY MOUTH DAILY 90 tablet 3  . meloxicam (MOBIC) 15 MG tablet Take 15 mg by mouth daily.    . valACYclovir (VALTREX) 1000 MG tablet Take 1,000 mg by mouth 2 (two) times daily as needed (fever blisters).     No current facility-administered medications for this visit.     SURGICAL HISTORY:  Past Surgical History:  Procedure Laterality Date  . CARDIOVASCULAR STRESS TEST  12/2016   Nuclear CP stress test: hypertensive response to exercise, normal low risk study, EF 68%  . COLONOSCOPY  09/2015   WNL [name])  . ENDOMETRIAL ABLATION W/ NOVASURE  01/2009   Archie Endo 12/02/2010  . LOBECTOMY Right 06/14/2016   RU Lobectomy; Surgeon: Melrose Nakayama, MD  . LYMPH NODE DISSECTION Right 06/14/2016   RIGHT UPPER LOBE LYMPH NODE DISSECTION;  Surgeon: Melrose Nakayama, MD  . [place] Bilateral 1998  . TUBAL LIGATION  2001  . VIDEO ASSISTED THORACOSCOPY (VATS)/WEDGE RESECTION Right 06/14/2016   Surgeon: Melrose Nakayama, MD    REVIEW OF SYSTEMS:  A comprehensive review of systems was negative.   PHYSICAL EXAMINATION: General appearance: alert, cooperative and no distress Head: Normocephalic, without obvious abnormality, atraumatic Neck: no adenopathy, no JVD, supple, symmetrical,  trachea midline and thyroid not enlarged, symmetric, no tenderness/mass/nodules Lymph nodes: Cervical, supraclavicular, and axillary nodes normal. Resp: clear to auscultation bilaterally Back: symmetric, no curvature. ROM normal. No CVA tenderness. Cardio: regular rate and rhythm, S1, S2 normal, no murmur, click, rub or gallop GI: soft, non-tender; bowel sounds normal; no masses,  no  organomegaly Extremities: extremities normal, atraumatic, no cyanosis or edema  ECOG PERFORMANCE STATUS: 0 - Asymptomatic  Blood pressure 138/87, pulse 82, temperature 98.9 F (37.2 C), temperature source Oral, resp. rate 17, height 5\' 2"  (1.575 m), weight 193 lb 6.4 oz (87.7 kg), SpO2 99 %.  LABORATORY DATA: Lab Results  Component Value Date   WBC 8.3 07/28/2018   HGB 13.2 07/28/2018   HCT 39.9 07/28/2018   MCV 82.8 07/28/2018   PLT 265 07/28/2018      Chemistry      Component Value Date/Time   NA 142 07/28/2018 1114   NA 140 07/20/2017 1338   K 4.7 07/28/2018 1114   K 4.1 07/20/2017 1338   CL 105 07/28/2018 1114   CO2 30 07/28/2018 1114   CO2 26 07/20/2017 1338   BUN 9 07/28/2018 1114   BUN 13.4 07/20/2017 1338   CREATININE 0.78 07/28/2018 1114   CREATININE 0.8 07/20/2017 1338      Component Value Date/Time   CALCIUM 9.3 07/28/2018 1114   CALCIUM 9.7 07/20/2017 1338   ALKPHOS 70 07/28/2018 1114   ALKPHOS 77 07/20/2017 1338   AST 15 07/28/2018 1114   AST 15 07/20/2017 1338   ALT 16 07/28/2018 1114   ALT 12 07/20/2017 1338   BILITOT 1.6 (H) 07/28/2018 1114   BILITOT 1.71 (H) 07/20/2017 1338       RADIOGRAPHIC STUDIES: Ct Chest W Contrast  Result Date: 07/28/2018 CLINICAL DATA:  Followup right lung carcinoma. Previous right upper lobectomy. EXAM: CT CHEST WITH CONTRAST TECHNIQUE: Multidetector CT imaging of the chest was performed during intravenous contrast administration. CONTRAST:  36mL OMNIPAQUE IOHEXOL 300 MG/ML  SOLN COMPARISON:  01/18/2018 FINDINGS: Cardiovascular:  No acute findings. Mediastinum/Nodes: 11 mm low-attenuation right hilar lymph node on image 59/2 shows no significant change. No new or increased lymphadenopathy identified within the thorax. Lungs/Pleura: Stable postop changes from right upper lobectomy. Scattered sub-cm bilateral pulmonary nodules remain stable, index lesion in the left lung apex measuring 4 mm. No new or enlarging pulmonary  nodules or masses identified. No evidence of pulmonary infiltrate or pleural effusion. Upper Abdomen:  Unremarkable. Musculoskeletal:  No suspicious bone lesions. IMPRESSION: No new or progressive disease within the thorax. Stable 11 mm right hilar lymph node. Stable tiny sub-cm bilateral pulmonary nodules. Electronically Signed   By: Earle Gell M.D.   On: 07/28/2018 15:48    ASSESSMENT AND PLAN: This is a very pleasant 52 years old white female with stage IA non-small cell lung cancer, adenocarcinoma presented with right upper lobe pulmonary nodule is status post right upper lobectomy with lymph node dissection in November 2017 under the care of Dr. Roxan Hockey. The patient is doing fine and has been in observation since that time. Repeat CT scan of the chest performed recently showed no concerning findings for disease recurrence or progression. I recommended for the patient to continue on observation for now. I will see her back for follow-up visit in 1 year for evaluation after repeating CT scan of the chest for restaging of her disease. The patient was advised to call immediately if she has any concerning symptoms in the interval. The patient voices understanding  of current disease status and treatment options and is in agreement with the current care plan. All questions were answered. The patient knows to call the clinic with any problems, questions or concerns. We can certainly see the patient much sooner if necessary. I spent 10 minutes counseling the patient face to face. The total time spent in the appointment was 15 minutes. Disclaimer: This note was dictated with voice recognition software. Similar sounding words can inadvertently be transcribed and may not be corrected upon review.

## 2018-08-04 DIAGNOSIS — M6281 Muscle weakness (generalized): Secondary | ICD-10-CM | POA: Diagnosis not present

## 2018-08-04 DIAGNOSIS — M7712 Lateral epicondylitis, left elbow: Secondary | ICD-10-CM | POA: Diagnosis not present

## 2018-08-04 DIAGNOSIS — M4722 Other spondylosis with radiculopathy, cervical region: Secondary | ICD-10-CM | POA: Diagnosis not present

## 2018-08-04 DIAGNOSIS — M25522 Pain in left elbow: Secondary | ICD-10-CM | POA: Diagnosis not present

## 2018-08-14 ENCOUNTER — Ambulatory Visit (INDEPENDENT_AMBULATORY_CARE_PROVIDER_SITE_OTHER): Payer: BLUE CROSS/BLUE SHIELD | Admitting: Family Medicine

## 2018-08-14 ENCOUNTER — Encounter: Payer: Self-pay | Admitting: Family Medicine

## 2018-08-14 VITALS — BP 122/80 | HR 103 | Temp 101.4°F | Ht 62.0 in | Wt 193.0 lb

## 2018-08-14 DIAGNOSIS — J208 Acute bronchitis due to other specified organisms: Secondary | ICD-10-CM

## 2018-08-14 DIAGNOSIS — B9689 Other specified bacterial agents as the cause of diseases classified elsewhere: Secondary | ICD-10-CM | POA: Diagnosis not present

## 2018-08-14 DIAGNOSIS — R509 Fever, unspecified: Secondary | ICD-10-CM

## 2018-08-14 DIAGNOSIS — J069 Acute upper respiratory infection, unspecified: Secondary | ICD-10-CM | POA: Insufficient documentation

## 2018-08-14 LAB — POC INFLUENZA A&B (BINAX/QUICKVUE)
INFLUENZA A, POC: NEGATIVE
Influenza B, POC: NEGATIVE

## 2018-08-14 MED ORDER — AZITHROMYCIN 250 MG PO TABS: ORAL_TABLET | ORAL | 0 refills | Status: DC

## 2018-08-14 NOTE — Patient Instructions (Addendum)
We will treat for bacterial bronchitis with zpack antibiotic.  Push fluids and rest. Continue robitussin DM night time.  Honey with lemon or herbal teas.  Watch for persistent fever >101, or worsening productive cough, or not improving with treatment.  May take ibuprofen 400-600mg  as needed for fever or cough

## 2018-08-14 NOTE — Progress Notes (Signed)
BP 122/80 (BP Location: Left Arm, Patient Position: Sitting, Cuff Size: Normal)   Pulse (!) 103   Temp (!) 101.4 F (38.6 C) (Oral)   Ht 5\' 2"  (1.575 m)   Wt 193 lb (87.5 kg)   SpO2 96%   BMI 35.30 kg/m    CC: cough Subjective:    Patient ID: Monica [name], female    DOB: Nov 07, 1965, 53 y.o.   MRN: [id number]  HPI: Monica [name] is a 53 y.o. female presenting on 08/14/2018 for Cough (C/o coughing episodes and wheezing. Cough started 08/11/17. Tried Robitussin, barely helpful. ); Headache (C/o HA yesterday. Tried ibuprofen, barely helpful. ); and Fever (C/o fever- max 100. Tried ibuprofen, helpful. )   URI sxs started a few weeks ago - cough, congestion, ST with PNDrainage, with wheezing, initial improving but then acutely worse last night with fever and productive cough. Sudden onset last night. Chest congestion. In bed all night last night. Coughing fits. Significant fatigue.   No ear or tooth pain, dyspnea, no significant body aches.  Treating with robitussin night time and ibuprofen.  No sick contacts at home.  No smokers at home.   H/o lung cancer - latest onc eval was reassuring.     Relevant past medical, surgical, family and social history reviewed and updated as indicated. Interim medical history since our last visit reviewed. Allergies and medications reviewed and updated. Outpatient Medications Prior to Visit  Medication Sig Dispense Refill  . Cholecalciferol (VITAMIN D-3) 1000 units CAPS Take 1,000 Units by mouth at bedtime.    . fluticasone (FLONASE) 50 MCG/ACT nasal spray Place 1 spray into both nostrils daily as needed for allergies. 16 g 0  . hydrochlorothiazide (HYDRODIURIL) 25 MG tablet Take 1 tablet (25 mg total) by mouth daily. 90 tablet 3  . ibuprofen (ADVIL,MOTRIN) 200 MG tablet Take 400 mg by mouth every 6 (six) hours as needed for mild pain.     Marland Kitchen levothyroxine (SYNTHROID) 150 MCG tablet Take 1 tablet (150 mcg total) by mouth daily before breakfast.  90 tablet 3  . losartan (COZAAR) 25 MG tablet TAKE 1 TABLET(25 MG) BY MOUTH DAILY 90 tablet 3  . valACYclovir (VALTREX) 1000 MG tablet Take 1,000 mg by mouth 2 (two) times daily as needed (fever blisters).    . meloxicam (MOBIC) 15 MG tablet Take 15 mg by mouth daily.     No facility-administered medications prior to visit.      Per HPI unless specifically indicated in ROS section below Review of Systems Objective:    BP 122/80 (BP Location: Left Arm, Patient Position: Sitting, Cuff Size: Normal)   Pulse (!) 103   Temp (!) 101.4 F (38.6 C) (Oral)   Ht 5\' 2"  (1.575 m)   Wt 193 lb (87.5 kg)   SpO2 96%   BMI 35.30 kg/m   Wt Readings from Last 3 Encounters:  08/14/18 193 lb (87.5 kg)  07/31/18 193 lb 6.4 oz (87.7 kg)  06/20/18 188 lb (85.3 kg)    Physical Exam Vitals signs and nursing note reviewed.  Constitutional:      Appearance: Normal appearance. She is well-developed. She is not ill-appearing.     Comments: Tired appearing  HENT:     Head: Normocephalic and atraumatic.     Right Ear: Hearing, tympanic membrane, ear canal and external ear normal.     Left Ear: Hearing, tympanic membrane, ear canal and external ear normal.     Nose: Mucosal edema present. No  congestion or rhinorrhea.     Right Sinus: No maxillary sinus tenderness or frontal sinus tenderness.     Left Sinus: No maxillary sinus tenderness or frontal sinus tenderness.     Mouth/Throat:     Mouth: Mucous membranes are moist.     Pharynx: Oropharynx is clear. Uvula midline. No oropharyngeal exudate or posterior oropharyngeal erythema.     Tonsils: No tonsillar abscesses.  Eyes:     General: No scleral icterus.    Conjunctiva/sclera: Conjunctivae normal.     Pupils: Pupils are equal, round, and reactive to light.  Neck:     Musculoskeletal: Normal range of motion and neck supple.  Cardiovascular:     Rate and Rhythm: Normal rate and regular rhythm.     Heart sounds: Normal heart sounds. No murmur.    Pulmonary:     Effort: Pulmonary effort is normal. No respiratory distress.     Breath sounds: Normal breath sounds. No wheezing, rhonchi or rales.     Comments: Lungs clear, coughing fits present Lymphadenopathy:     Cervical: No cervical adenopathy.  Skin:    General: Skin is warm and dry.     Findings: No rash.  Neurological:     Mental Status: She is alert.       Results for orders placed or performed in visit on 08/14/18  POC Influenza A&B(BINAX/QUICKVUE)  Result Value Ref Range   Influenza A, POC Negative Negative   Influenza B, POC Negative Negative    Assessment & Plan:   Problem List Items Addressed This Visit    URI with cough and congestion - Primary    Lungs sound clear today. Possible influenza - flu swab negative today. Will treat as bacterial bronchitis given fever, and acute worsening after initial improvement.  Further supportive care reviewed.  Update if not improving with treatment.       Relevant Medications   azithromycin (ZITHROMAX) 250 MG tablet    Other Visit Diagnoses    Fever, unspecified fever cause       Relevant Orders   POC Influenza A&B(BINAX/QUICKVUE) (Completed)       Meds ordered this encounter  Medications  . azithromycin (ZITHROMAX) 250 MG tablet    Sig: Take two tablets on day one followed by one tablet on days 2-5    Dispense:  6 each    Refill:  0   Orders Placed This Encounter  Procedures  . POC Influenza A&B(BINAX/QUICKVUE)   Patient Instructions  We will treat for bacterial bronchitis with zpack antibiotic.  Push fluids and rest. Continue robitussin DM night time.  Honey with lemon or herbal teas.  Watch for persistent fever >101, or worsening productive cough, or not improving with treatment.  May take ibuprofen 400-600mg  as needed for fever or cough   Follow up plan: Return if symptoms worsen or fail to improve.  [name], MD

## 2018-08-14 NOTE — Assessment & Plan Note (Addendum)
Lungs sound clear today. Possible influenza - flu swab negative today. Will treat as bacterial bronchitis given fever, and acute worsening after initial improvement.  Further supportive care reviewed.  Update if not improving with treatment.

## 2018-08-15 ENCOUNTER — Telehealth: Payer: Self-pay

## 2018-08-15 MED ORDER — DOXYCYCLINE HYCLATE 100 MG PO TABS: 100.0000 mg | ORAL_TABLET | Freq: Two times a day (BID) | ORAL | 0 refills | Status: DC

## 2018-08-15 NOTE — Telephone Encounter (Signed)
May try doxycycline 7d course in place of azithromycin. Unfortunately all abx can have diarrhea side effect, zpack tends to be less than others. How is cough and fever?

## 2018-08-15 NOTE — Telephone Encounter (Signed)
Spoke with Monica [name] relaying Dr. Synthia Innocent message.  Monica [name] verbalizes understanding. States she does not have a fever but the cough is still persistent and about same and the Robitussin does not help at all.

## 2018-08-15 NOTE — Telephone Encounter (Signed)
Pt left v/m; pt was seen on 08/14/18 and abx is causing severe diarrhea ever time pt takes med and request different abx to [facility]. Pt request cb when med sent to pharmacy.

## 2018-08-15 NOTE — Telephone Encounter (Signed)
Would she like to try codeine cough syrup?

## 2018-08-16 MED ORDER — GUAIFENESIN-CODEINE 100-10 MG/5ML PO SYRP: 5.0000 mL | ORAL_SOLUTION | Freq: Two times a day (BID) | ORAL | 0 refills | Status: DC | PRN

## 2018-08-16 NOTE — Telephone Encounter (Signed)
Spoke with pt asking about cough syrup.  Says, yes, she would like the rx for codeine cough syrup sent to [facility].

## 2018-08-16 NOTE — Telephone Encounter (Signed)
Cough syrup sent to pharmacy.

## 2018-08-16 NOTE — Addendum Note (Signed)
Addended by: [name] on: 08/16/2018 03:05 PM   Modules accepted: Orders

## 2018-08-17 NOTE — Telephone Encounter (Signed)
Yes - try with food.  If unable to tolerate this let us know.

## 2018-08-17 NOTE — Telephone Encounter (Signed)
Spoke to pt who states she was given an alternate abx, doxycycline. She took two doses on 00/17 with no complication, but began to vomit with dose this am. The pts bottle indicates that she is to take on an empty stomach, unless directed to take with food, as directed by physician to avoid upset stomach. She is wanting to know should she try taking it with food, or if she is needing another abx. pls advise

## 2018-08-17 NOTE — Telephone Encounter (Addendum)
Spoke with pt relaying Dr. G's message. Pt verbalizes understanding.  

## 2018-10-02 ENCOUNTER — Encounter: Payer: Self-pay | Admitting: Family Medicine

## 2018-10-02 ENCOUNTER — Ambulatory Visit: Payer: BLUE CROSS/BLUE SHIELD | Admitting: Family Medicine

## 2018-10-02 VITALS — BP 118/74 | HR 93 | Temp 98.6°F | Ht 62.0 in | Wt 198.2 lb

## 2018-10-02 DIAGNOSIS — R05 Cough: Secondary | ICD-10-CM | POA: Diagnosis not present

## 2018-10-02 DIAGNOSIS — R053 Chronic cough: Secondary | ICD-10-CM

## 2018-10-02 MED ORDER — ALBUTEROL SULFATE HFA 108 (90 BASE) MCG/ACT IN AERS: 2.0000 | INHALATION_SPRAY | Freq: Four times a day (QID) | RESPIRATORY_TRACT | 1 refills | Status: DC | PRN

## 2018-10-02 MED ORDER — PREDNISONE 20 MG PO TABS: ORAL_TABLET | ORAL | 0 refills | Status: DC

## 2018-10-02 NOTE — Assessment & Plan Note (Signed)
Lungs clear today.  Anticipate post-infectious cough.  Completed doxy course initially for presumed bacterial bronchitis. Will Rx prednisone, albuterol inhaler with indications how to use. Update if not improved after treatment, would consider CXR in personal hx lung cancer.

## 2018-10-02 NOTE — Patient Instructions (Addendum)
I think you have residual post infectious cough that is taking its time to go away. Treat with prednisone for inflammation, and albuterol inhaler 2 puffs three times daily for next 3 days then as needed.  Let us know if not better with treatment.

## 2018-10-02 NOTE — Progress Notes (Signed)
BP 118/74 (BP Location: Left Arm, Patient Position: Sitting, Cuff Size: Normal)   Pulse 93   Temp 98.6 F (37 C) (Oral)   Ht 5\' 2"  (1.575 m)   Wt 198 lb 3 oz (89.9 kg)   SpO2 98%   BMI 36.25 kg/m    CC: cough Subjective:    Patient ID: Bethann Berkshire, female    DOB: 1965/09/28, 53 y.o.   MRN: [id number]  HPI: ARIAM MOL is a 53 y.o. female presenting on 10/02/2018 for Cough (C/o she still has cough for about 1.5 mos. Seen and treated previously, no improvement. )   See prior note for details - treated early last month for acute bronchitis with zpack. zpack caused marked diarrhea - she only took 2 days of this. Doxycycline caused vomiting but she did better with food. Febrile at that time with productive cough. Was out of work for a week. She was very ill.   Now with persistent cough, occasionally productive of mucous, other times dry.  Ongoing cough for 6 wks.   No further fever. No significant congestion. Overall feeling better.  Has been using halls cough drops regularly. Has tried mucinex as well.   No h/o asthma.  Tessalon perls haven't helped much.      Relevant past medical, surgical, family and social history reviewed and updated as indicated. Interim medical history since our last visit reviewed. Allergies and medications reviewed and updated. Outpatient Medications Prior to Visit  Medication Sig Dispense Refill  . Cholecalciferol (VITAMIN D-3) 1000 units CAPS Take 1,000 Units by mouth at bedtime.    . fluticasone (FLONASE) 50 MCG/ACT nasal spray Place 1 spray into both nostrils daily as needed for allergies. 16 g 0  . hydrochlorothiazide (HYDRODIURIL) 25 MG tablet Take 1 tablet (25 mg total) by mouth daily. 90 tablet 3  . ibuprofen (ADVIL,MOTRIN) 200 MG tablet Take 400 mg by mouth every 6 (six) hours as needed for mild pain.     Marland Kitchen levothyroxine (SYNTHROID) 150 MCG tablet Take 1 tablet (150 mcg total) by mouth daily before breakfast. 90 tablet 3  . losartan  (COZAAR) 25 MG tablet TAKE 1 TABLET(25 MG) BY MOUTH DAILY 90 tablet 3  . meloxicam (MOBIC) 15 MG tablet Take 15 mg by mouth daily.    . valACYclovir (VALTREX) 1000 MG tablet Take 1,000 mg by mouth 2 (two) times daily as needed (fever blisters).    Marland Kitchen azithromycin (ZITHROMAX) 250 MG tablet Take two tablets on day one followed by one tablet on days 2-5 6 each 0  . doxycycline (VIBRA-TABS) 100 MG tablet Take 1 tablet (100 mg total) by mouth 2 (two) times daily. 14 tablet 0  . guaiFENesin-codeine (CHERATUSSIN AC) 100-10 MG/5ML syrup Take 5 mLs by mouth 2 (two) times daily as needed for cough (sedation precautions). 120 mL 0   No facility-administered medications prior to visit.      Per HPI unless specifically indicated in ROS section below Review of Systems Objective:    BP 118/74 (BP Location: Left Arm, Patient Position: Sitting, Cuff Size: Normal)   Pulse 93   Temp 98.6 F (37 C) (Oral)   Ht 5\' 2"  (1.575 m)   Wt 198 lb 3 oz (89.9 kg)   SpO2 98%   BMI 36.25 kg/m   Wt Readings from Last 3 Encounters:  10/02/18 198 lb 3 oz (89.9 kg)  08/14/18 193 lb (87.5 kg)  07/31/18 193 lb 6.4 oz (87.7 kg)    Physical  Exam Vitals signs and nursing note reviewed.  Constitutional:      General: She is not in acute distress.    Appearance: She is well-developed.  HENT:     Head: Normocephalic and atraumatic.     Right Ear: Hearing, tympanic membrane, ear canal and external ear normal.     Left Ear: Hearing, tympanic membrane, ear canal and external ear normal.     Nose: Nose normal. No mucosal edema, congestion or rhinorrhea.     Right Sinus: No maxillary sinus tenderness or frontal sinus tenderness.     Left Sinus: No maxillary sinus tenderness or frontal sinus tenderness.     Mouth/Throat:     Mouth: Mucous membranes are moist.     Pharynx: Uvula midline. No oropharyngeal exudate or posterior oropharyngeal erythema.     Tonsils: No tonsillar abscesses.  Eyes:     General: No scleral  icterus.    Conjunctiva/sclera: Conjunctivae normal.     Pupils: Pupils are equal, round, and reactive to light.  Neck:     Musculoskeletal: Normal range of motion and neck supple.  Cardiovascular:     Rate and Rhythm: Normal rate and regular rhythm.     Pulses: Normal pulses.     Heart sounds: Normal heart sounds. No murmur.  Pulmonary:     Effort: Pulmonary effort is normal. No respiratory distress.     Breath sounds: Normal breath sounds. No wheezing, rhonchi or rales.     Comments: Lungs clear Lymphadenopathy:     Cervical: No cervical adenopathy.  Skin:    General: Skin is warm and dry.     Findings: No rash.       Results for orders placed or performed in visit on 08/14/18  POC Influenza A&B(BINAX/QUICKVUE)  Result Value Ref Range   Influenza A, POC Negative Negative   Influenza B, POC Negative Negative   Assessment & Plan:   Problem List Items Addressed This Visit    Persistent cough - Primary    Lungs clear today.  Anticipate post-infectious cough.  Completed doxy course initially for presumed bacterial bronchitis. Will Rx prednisone, albuterol inhaler with indications how to use. Update if not improved after treatment, would consider CXR in personal hx lung cancer.          Meds ordered this encounter  Medications  . albuterol (PROVENTIL HFA;VENTOLIN HFA) 108 (90 Base) MCG/ACT inhaler    Sig: Inhale 2 puffs into the lungs every 6 (six) hours as needed for wheezing or shortness of breath.    Dispense:  1 Inhaler    Refill:  1  . predniSONE (DELTASONE) 20 MG tablet    Sig: Take two tablets daily for 3 days followed by one tablet daily for 4 days    Dispense:  10 tablet    Refill:  0   No orders of the defined types were placed in this encounter.   Follow up plan: Return if symptoms worsen or fail to improve.  [name], MD

## 2018-10-11 DIAGNOSIS — Z01419 Encounter for gynecological examination (general) (routine) without abnormal findings: Secondary | ICD-10-CM | POA: Diagnosis not present

## 2018-10-11 DIAGNOSIS — Z6837 Body mass index (BMI) 37.0-37.9, adult: Secondary | ICD-10-CM | POA: Diagnosis not present

## 2018-10-11 DIAGNOSIS — Z1231 Encounter for screening mammogram for malignant neoplasm of breast: Secondary | ICD-10-CM | POA: Diagnosis not present

## 2018-10-11 LAB — HM PAP SMEAR: HM Pap smear: NEGATIVE

## 2018-11-08 ENCOUNTER — Other Ambulatory Visit: Payer: Self-pay | Admitting: Obstetrics and Gynecology

## 2018-11-08 DIAGNOSIS — Z9189 Other specified personal risk factors, not elsewhere classified: Secondary | ICD-10-CM

## 2018-11-17 ENCOUNTER — Ambulatory Visit: Payer: BLUE CROSS/BLUE SHIELD | Admitting: Family Medicine

## 2018-11-17 ENCOUNTER — Encounter: Payer: Self-pay | Admitting: Family Medicine

## 2018-11-17 ENCOUNTER — Other Ambulatory Visit: Payer: Self-pay

## 2018-11-17 VITALS — BP 124/66 | HR 89 | Temp 98.6°F | Ht 62.0 in | Wt 200.3 lb

## 2018-11-17 DIAGNOSIS — Z85118 Personal history of other malignant neoplasm of bronchus and lung: Secondary | ICD-10-CM

## 2018-11-17 DIAGNOSIS — R1011 Right upper quadrant pain: Secondary | ICD-10-CM

## 2018-11-17 LAB — COMPREHENSIVE METABOLIC PANEL
ALT: 18 U/L (ref 0–35)
AST: 18 U/L (ref 0–37)
Albumin: 4.2 g/dL (ref 3.5–5.2)
Alkaline Phosphatase: 64 U/L (ref 39–117)
BUN: 12 mg/dL (ref 6–23)
CO2: 29 mEq/L (ref 19–32)
Calcium: 9.5 mg/dL (ref 8.4–10.5)
Chloride: 102 mEq/L (ref 96–112)
Creatinine, Ser: 0.76 mg/dL (ref 0.40–1.20)
GFR: 79.23 mL/min (ref >60.00)
Glucose, Bld: 117 mg/dL — ABNORMAL HIGH (ref 70–99)
Potassium: 4.4 mEq/L (ref 3.5–5.1)
Sodium: 141 mEq/L (ref 135–145)
Total Bilirubin: 1.8 mg/dL — ABNORMAL HIGH (ref 0.2–1.2)
Total Protein: 7.2 g/dL (ref 6.0–8.3)

## 2018-11-17 LAB — CBC WITH DIFFERENTIAL/PLATELET
Basophils Absolute: 0 10*3/uL (ref 0.0–0.1)
Basophils Relative: 0.5 % (ref 0.0–3.0)
Eosinophils Absolute: 0.1 10*3/uL (ref 0.0–0.7)
Eosinophils Relative: 1.5 % (ref 0.0–5.0)
HCT: 40.8 % (ref 36.0–46.0)
Hemoglobin: 13.9 g/dL (ref 12.0–15.0)
Lymphocytes Relative: 22.3 % (ref 12.0–46.0)
Lymphs Abs: 1.8 10*3/uL (ref 0.7–4.0)
MCHC: 33.9 g/dL (ref 30.0–36.0)
MCV: 81.6 fl (ref 78.0–100.0)
Monocytes Absolute: 0.4 10*3/uL (ref 0.1–1.0)
Monocytes Relative: 4.4 % (ref 3.0–12.0)
Neutro Abs: 5.7 10*3/uL (ref 1.4–7.7)
Neutrophils Relative %: 71.3 % (ref 43.0–77.0)
Platelets: 276 10*3/uL (ref 150.0–400.0)
RBC: 5 Mil/uL (ref 3.87–5.11)
RDW: 14.7 % (ref 11.5–15.5)
WBC: 8 10*3/uL (ref 4.0–10.5)

## 2018-11-17 LAB — LIPASE: Lipase: 16 U/L (ref 11.0–59.0)

## 2018-11-17 NOTE — Assessment & Plan Note (Signed)
Anticipate cholelithiasis given typical symptoms - check CMP, CBC, lipase and RUQ [place]. Consider HIDA scan if unrevealing [place]. Discussed possible gen surgery referral. Pt agrees with plan.

## 2018-11-17 NOTE — Progress Notes (Signed)
BP 124/66 (BP Location: Left Arm, Patient Position: Sitting, Cuff Size: Large)   Pulse 89   Temp 98.6 F (37 C) (Oral)   Ht 5\' 2"  (1.575 m)   Wt 200 lb 5 oz (90.9 kg)   SpO2 97%   BMI 36.64 kg/m    CC: R sided pain Subjective:    Patient ID: Monica [name], female    DOB: November 14, 1965, 53 y.o.   MRN: [id number]  HPI: Monica [name] is a 53 y.o. female presenting on 11/17/2018 for Abdominal Pain (C/o right side abd pain to the right of belly button. Denies N/V. Had diarrhea on 11/15/18. BMs this morning. Abd pain started yesterday, worsened overnight. Has had similar pain in same area yrs ago.)   Yesterday when she awoke noted constant sharp ache RUQ abd pain worse with deep breathing. Pain did radiate to posterior ribcage. Yesterday morning felt nauseated. Wednesday night had toast with milk gravy (made with pork chop grease). She normally avoids greasy foods. 2d ago she did have several episodes of diarrhea. BMs now back to normal. Some heartburn, bloating, gassiness noted.   No fevers/chills, vomiting, urinary symptoms.   Has had this pain in the past, last remotely >10 yrs ago with normal ultrasound. No h/o gallstones.      Relevant past medical, surgical, family and social history reviewed and updated as indicated. Interim medical history since our last visit reviewed. Allergies and medications reviewed and updated. Outpatient Medications Prior to Visit  Medication Sig Dispense Refill  . albuterol (PROVENTIL HFA;VENTOLIN HFA) 108 (90 Base) MCG/ACT inhaler Inhale 2 puffs into the lungs every 6 (six) hours as needed for wheezing or shortness of breath. 1 Inhaler 1  . Cholecalciferol (VITAMIN D-3) 1000 units CAPS Take 1,000 Units by mouth at bedtime.    . fluticasone (FLONASE) 50 MCG/ACT nasal spray Place 1 spray into both nostrils daily as needed for allergies. 16 g 0  . hydrochlorothiazide (HYDRODIURIL) 25 MG tablet Take 1 tablet (25 mg total) by mouth daily. 90 tablet 3  .  ibuprofen (ADVIL,MOTRIN) 200 MG tablet Take 400 mg by mouth every 6 (six) hours as needed for mild pain.     Marland Kitchen levothyroxine (SYNTHROID) 150 MCG tablet Take 1 tablet (150 mcg total) by mouth daily before breakfast. 90 tablet 3  . losartan (COZAAR) 25 MG tablet TAKE 1 TABLET(25 MG) BY MOUTH DAILY 90 tablet 3  . meloxicam (MOBIC) 15 MG tablet Take 15 mg by mouth daily.    . valACYclovir (VALTREX) 1000 MG tablet Take 1,000 mg by mouth 2 (two) times daily as needed (fever blisters).    . predniSONE (DELTASONE) 20 MG tablet Take two tablets daily for 3 days followed by one tablet daily for 4 days 10 tablet 0   No facility-administered medications prior to visit.      Per HPI unless specifically indicated in ROS section below Review of Systems Objective:    BP 124/66 (BP Location: Left Arm, Patient Position: Sitting, Cuff Size: Large)   Pulse 89   Temp 98.6 F (37 C) (Oral)   Ht 5\' 2"  (1.575 m)   Wt 200 lb 5 oz (90.9 kg)   SpO2 97%   BMI 36.64 kg/m   Wt Readings from Last 3 Encounters:  11/17/18 200 lb 5 oz (90.9 kg)  10/02/18 198 lb 3 oz (89.9 kg)  08/14/18 193 lb (87.5 kg)    Physical Exam Vitals signs and nursing note reviewed.  Constitutional:  Appearance: Normal appearance. She is obese. She is not ill-appearing.  HENT:     Mouth/Throat:     Mouth: Mucous membranes are moist.     Pharynx: No posterior oropharyngeal erythema.  Cardiovascular:     Rate and Rhythm: Normal rate and regular rhythm.     Pulses: Normal pulses.     Heart sounds: Normal heart sounds. No murmur.  Pulmonary:     Effort: Pulmonary effort is normal. No respiratory distress.     Breath sounds: Normal breath sounds. No wheezing, rhonchi or rales.  Abdominal:     General: Bowel sounds are normal. There is no distension.     Palpations: Abdomen is soft. There is no mass.     Tenderness: There is generalized abdominal tenderness. There is no right CVA tenderness, left CVA tenderness, guarding or  rebound. Negative signs include Murphy's sign.     Hernia: No hernia is present.     Comments: Predominant discomfort at RUQ  Neurological:     Mental Status: She is alert.  Psychiatric:        Mood and Affect: Mood normal.        Behavior: Behavior normal.        Assessment & Plan:   Problem List Items Addressed This Visit    RUQ abdominal pain - Primary    Anticipate cholelithiasis given typical symptoms - check CMP, CBC, lipase and RUQ [place]. Consider HIDA scan if unrevealing [place]. Discussed possible gen surgery referral. Pt agrees with plan.       Relevant Orders   Comprehensive metabolic panel   CBC with Differential/Platelet   Lipase   US Abdomen Limited RUQ   History of adenocarcinoma of lung   Gilbert disease       No orders of the defined types were placed in this encounter.  Orders Placed This Encounter  Procedures  . US Abdomen Limited RUQ    Standing Status:   Future    Standing Expiration Date:   01/17/2020    Order Specific Question:   Reason for Exam (SYMPTOM  OR DIAGNOSIS REQUIRED)    Answer:   RUQ abd pain    Order Specific Question:   Preferred imaging location?    Answer:   [facility]  . Comprehensive metabolic panel  . CBC with Differential/Platelet  . Lipase    Patient instructions: I think you have gallstones. Labs today. We will order abdominal ultrasound.  We will be in touch with results. In the mean time, avoid fatty greasy foods.   Follow up plan: No follow-ups on file.  [name], MD

## 2018-11-17 NOTE — Patient Instructions (Signed)
I think you have gallstones. Labs today. We will order abdominal ultrasound.  We will be in touch with results. In the mean time, avoid fatty greasy foods.   Cholelithiasis  Cholelithiasis is a form of gallbladder disease in which gallstones form in the gallbladder. The gallbladder is an organ that stores bile. Bile is made in the liver, and it helps to digest fats. Gallstones begin as small crystals and slowly grow into stones. They may cause no symptoms until the gallbladder tightens (contracts) and a gallstone is blocking the duct (gallbladder attack), which can cause pain. Cholelithiasis is also referred to as gallstones. There are two main types of gallstones:  Cholesterol stones. These are made of hardened cholesterol and are usually yellow-green in color. They are the most common type of gallstone. Cholesterol is a white, waxy, fat-like substance that is made in the liver.  Pigment stones. These are dark in color and are made of a red-yellow substance that forms when hemoglobin from red blood cells breaks down (bilirubin). What are the causes? This condition may be caused by an imbalance in the substances that bile is made of. This can happen if the bile:  Has too much bilirubin.  Has too much cholesterol.  Does not have enough bile salts. These salts help the body absorb and digest fats. In some cases, this condition can also be caused by the gallbladder not emptying completely or often enough. What increases the risk? The following factors may make you more likely to develop this condition:  Being female.  Having multiple pregnancies. Health care providers sometimes advise removing diseased gallbladders before future pregnancies.  Eating a diet that is heavy in fried foods, fat, and refined carbohydrates, like white bread and white rice.  Being obese.  Being older than age 48.  Prolonged use of medicines that contain female hormones (estrogen).  Having diabetes mellitus.   Rapidly losing weight.  Having a family history of gallstones.  Being of [place] or [place] descent.  Having an intestinal disease such as Crohn disease.  Having metabolic syndrome.  Having cirrhosis.  Having severe types of anemia such as sickle cell anemia. What are the signs or symptoms? In most cases, there are no symptoms. These are known as silent gallstones. If a gallstone blocks the bile ducts, it can cause a gallbladder attack. The main symptom of a gallbladder attack is sudden pain in the upper right abdomen. The pain usually comes at night or after eating a large meal. The pain can last for one or several hours and can spread to the right shoulder or chest. If the bile duct is blocked for more than a few hours, it can cause infection or inflammation of the gallbladder, liver, or pancreas, which may cause:  Nausea.  Vomiting.  Abdominal pain that lasts for 5 hours or more.  Fever or chills.  Yellowing of the skin or the whites of the eyes (jaundice).  Dark urine.  Light-colored stools. How is this diagnosed? This condition may be diagnosed based on:  A physical exam.  Your medical history.  An ultrasound of your gallbladder.  CT scan.  MRI.  Blood tests to check for signs of infection or inflammation.  A scan of your gallbladder and bile ducts (biliary system) using nonharmful radioactive material and special cameras that can see the radioactive material (cholescintigram). This test checks to see how your gallbladder contracts and whether bile ducts are blocked.  Inserting a small tube with a camera on the  end (endoscope) through your mouth to inspect bile ducts and check for blockages (endoscopic retrograde cholangiopancreatogram). How is this treated? Treatment for gallstones depends on the severity of the condition. Silent gallstones do not need treatment. If the gallstones cause a gallbladder attack or other symptoms, treatment may be  required. Options for treatment include:  Surgery to remove the gallbladder (cholecystectomy). This is the most common treatment.  Medicines to dissolve gallstones. These are most effective at treating small gallstones. You may need to take medicines for up to 6-12 months.  Shock wave treatment (extracorporeal biliary lithotripsy). In this treatment, an ultrasound machine sends shock waves to the gallbladder to break gallstones into smaller pieces. These pieces can then be passed into the intestines or be dissolved by medicine. This is rarely used.  Removing gallstones through endoscopic retrograde cholangiopancreatogram. A small basket can be attached to the endoscope and used to capture and remove gallstones. Follow these instructions at home:  Take over-the-counter and prescription medicines only as told by your health care provider.  Maintain a healthy weight and follow a healthy diet. This includes: ? Reducing fatty foods, such as fried food. ? Reducing refined carbohydrates, like white bread and white rice. ? Increasing fiber. Aim for foods like almonds, fruit, and beans.  Keep all follow-up visits as told by your health care provider. This is important. Contact a health care provider if:  You think you have had a gallbladder attack.  You have been diagnosed with silent gallstones and you develop abdominal pain or indigestion. Get help right away if:  You have pain from a gallbladder attack that lasts for more than 2 hours.  You have abdominal pain that lasts for more than 5 hours.  You have a fever or chills.  You have persistent nausea and vomiting.  You develop jaundice.  You have dark urine or light-colored stools. Summary  Cholelithiasis (also called gallstones) is a form of gallbladder disease in which gallstones form in the gallbladder.  This condition is caused by an imbalance in the substances that make up bile. This can happen if the bile has too much  cholesterol, too much bilirubin, or not enough bile salts.  You are more likely to develop this condition if you are female, pregnant, using medicines with estrogen, obese, older than age 38, or have a family history of gallstones. You may also develop gallstones if you have diabetes, an intestinal disease, cirrhosis, or metabolic syndrome.  Treatment for gallstones depends on the severity of the condition. Silent gallstones do not need treatment.  If gallstones cause a gallbladder attack or other symptoms, treatment may be needed. The most common treatment is surgery to remove the gallbladder. This information is not intended to replace advice given to you by your health care provider. Make sure you discuss any questions you have with your health care provider. Document Released: 07/15/2005 Document Revised: 01/17/2017 Document Reviewed: 04/04/2016 Elsevier Interactive Patient Education  2019 [employer].

## 2018-11-22 ENCOUNTER — Other Ambulatory Visit: Payer: Self-pay

## 2018-11-22 ENCOUNTER — Other Ambulatory Visit: Payer: Self-pay | Admitting: Family Medicine

## 2018-11-22 ENCOUNTER — Ambulatory Visit
Admission: RE | Admit: 2018-11-22 | Discharge: 2018-11-22 | Disposition: A | Discharge status: Home / Self Care | Payer: BLUE CROSS/BLUE SHIELD | Source: Ambulatory Visit | Attending: Family Medicine | Admitting: Family Medicine

## 2018-11-22 DIAGNOSIS — R1011 Right upper quadrant pain: Secondary | ICD-10-CM

## 2018-12-14 ENCOUNTER — Telehealth: Payer: Self-pay | Admitting: Family Medicine

## 2018-12-14 NOTE — Telephone Encounter (Signed)
Saw patient in person and patient asked me to send message regarding the Hida Scan that patient is scheduled for in a few weeks.She said that she is not having any pain at all now and wants to know if she should still do the gallbladder function test as scheduled. Can call patient on cell# [phone] can leave a voicemail or work# [phone] with what Dr Danise Mina recommends that she do.

## 2018-12-14 NOTE — Telephone Encounter (Signed)
Glad she's feeling better. Still suspicious for gallbladder dysfunction as cause of symptoms. So still reasonable to proceed with gallbladder study. If desired, she could cancel and defer for now, but if symptoms return would again want to obtain this.  Let me know what she decides.

## 2018-12-18 NOTE — Telephone Encounter (Signed)
Noted. Thanks.

## 2018-12-18 NOTE — Telephone Encounter (Signed)
Patient returned call and asked me to reschedule the Hida Scan to August 3rd, 2020 . Hida Scan appt changed and called patient and left her voicemail that it was changed.

## 2019-01-01 ENCOUNTER — Ambulatory Visit (HOSPITAL_COMMUNITY): Payer: BLUE CROSS/BLUE SHIELD

## 2019-01-26 ENCOUNTER — Ambulatory Visit
Admission: RE | Admit: 2019-01-26 | Discharge: 2019-01-26 | Disposition: A | Discharge status: Home / Self Care | Payer: BC Managed Care – PPO | Source: Ambulatory Visit | Attending: Obstetrics and Gynecology | Admitting: Obstetrics and Gynecology

## 2019-01-26 DIAGNOSIS — Z9189 Other specified personal risk factors, not elsewhere classified: Secondary | ICD-10-CM

## 2019-01-26 DIAGNOSIS — R928 Other abnormal and inconclusive findings on diagnostic imaging of breast: Secondary | ICD-10-CM | POA: Diagnosis not present

## 2019-01-26 DIAGNOSIS — Z803 Family history of malignant neoplasm of breast: Secondary | ICD-10-CM | POA: Diagnosis not present

## 2019-01-26 MED ORDER — GADOBUTROL 1 MMOL/ML IV SOLN
9.0000 mL | Freq: Once | INTRAVENOUS | Status: AC | PRN
  Administered 2019-01-26: 9 mL via INTRAVENOUS

## 2019-02-27 ENCOUNTER — Telehealth: Payer: Self-pay | Admitting: Family Medicine

## 2019-02-27 DIAGNOSIS — R1011 Right upper quadrant pain: Secondary | ICD-10-CM

## 2019-02-27 NOTE — Telephone Encounter (Signed)
Noted  

## 2019-02-27 NOTE — Telephone Encounter (Signed)
Received a call from the patient saying she wasn't comfortable going to the hospital to have the [place] and asked [place] to cancel the test. Hida cancelled.

## 2019-03-05 ENCOUNTER — Ambulatory Visit (HOSPITAL_COMMUNITY): Payer: BC Managed Care – PPO

## 2019-03-09 DIAGNOSIS — Z20828 Contact with and (suspected) exposure to other viral communicable diseases: Secondary | ICD-10-CM | POA: Diagnosis not present

## 2019-03-16 DIAGNOSIS — H04123 Dry eye syndrome of bilateral lacrimal glands: Secondary | ICD-10-CM | POA: Diagnosis not present

## 2019-03-16 DIAGNOSIS — H5213 Myopia, bilateral: Secondary | ICD-10-CM | POA: Diagnosis not present

## 2019-03-18 ENCOUNTER — Other Ambulatory Visit: Payer: Self-pay | Admitting: Family Medicine

## 2019-03-21 NOTE — Telephone Encounter (Signed)
E-scribed refill.  Plz schedule CPE and labs. 

## 2019-03-22 NOTE — Telephone Encounter (Signed)
lvm for pt to call back and schedule cpe with labs

## 2019-03-23 ENCOUNTER — Ambulatory Visit: Payer: BC Managed Care – PPO | Admitting: Podiatry

## 2019-03-23 ENCOUNTER — Other Ambulatory Visit: Payer: Self-pay

## 2019-03-23 ENCOUNTER — Ambulatory Visit (INDEPENDENT_AMBULATORY_CARE_PROVIDER_SITE_OTHER): Payer: BC Managed Care – PPO

## 2019-03-23 ENCOUNTER — Encounter: Payer: Self-pay | Admitting: Podiatry

## 2019-03-23 VITALS — Temp 97.2°F

## 2019-03-23 DIAGNOSIS — M722 Plantar fascial fibromatosis: Secondary | ICD-10-CM

## 2019-03-23 NOTE — Progress Notes (Signed)
Subjective:   Patient ID: Monica [name], female   DOB: 53 y.o.   MRN: [id number]   HPI Patient presents stating the heel is bothering him quite a bit again and it is worse when I get up in the morning and after periods of sitting   ROS      Objective:  Physical Exam  Neurovascular status intact with exquisite discomfort left heel at the insertional point tendon into the calcaneus with inflammation fluid buildup around the medial band     Assessment:  Acute plantar fasciitis left with inflammation fluid of the medial band     Plan:  H&P reviewed condition and discussed long-term stretching and I dispensed a night splint with all instructions on usage.  Explained ice therapy and today I did sterile prep and injected the fascia 3 mg Kenalog 5 mg Xylocaine and will be seen back to recheck

## 2019-03-26 NOTE — Telephone Encounter (Signed)
Pt called back and schedule labs 06/01/19 and cpe on 06/08/2019.

## 2019-04-20 ENCOUNTER — Telehealth: Payer: Self-pay | Admitting: Internal Medicine

## 2019-04-20 NOTE — Telephone Encounter (Signed)
Returned patient's phone call regarding rescheduling an appointment, left a voicemail. 

## 2019-04-24 ENCOUNTER — Telehealth: Payer: Self-pay | Admitting: Internal Medicine

## 2019-04-24 NOTE — Telephone Encounter (Signed)
Patient returned phone call regarding rescheduling December appointments, appointments have been moved to November.

## 2019-05-03 ENCOUNTER — Other Ambulatory Visit: Payer: Self-pay | Admitting: Family Medicine

## 2019-05-28 DIAGNOSIS — Z20828 Contact with and (suspected) exposure to other viral communicable diseases: Secondary | ICD-10-CM | POA: Diagnosis not present

## 2019-05-30 ENCOUNTER — Other Ambulatory Visit: Payer: Self-pay | Admitting: Family Medicine

## 2019-05-30 DIAGNOSIS — Z862 Personal history of diseases of the blood and blood-forming organs and certain disorders involving the immune mechanism: Secondary | ICD-10-CM

## 2019-05-30 DIAGNOSIS — Z85118 Personal history of other malignant neoplasm of bronchus and lung: Secondary | ICD-10-CM

## 2019-05-30 DIAGNOSIS — E039 Hypothyroidism, unspecified: Secondary | ICD-10-CM

## 2019-05-30 DIAGNOSIS — R739 Hyperglycemia, unspecified: Secondary | ICD-10-CM

## 2019-05-30 DIAGNOSIS — E785 Hyperlipidemia, unspecified: Secondary | ICD-10-CM

## 2019-05-30 DIAGNOSIS — E559 Vitamin D deficiency, unspecified: Secondary | ICD-10-CM

## 2019-06-01 ENCOUNTER — Other Ambulatory Visit (INDEPENDENT_AMBULATORY_CARE_PROVIDER_SITE_OTHER): Payer: BC Managed Care – PPO

## 2019-06-01 DIAGNOSIS — R739 Hyperglycemia, unspecified: Secondary | ICD-10-CM

## 2019-06-01 DIAGNOSIS — E039 Hypothyroidism, unspecified: Secondary | ICD-10-CM | POA: Diagnosis not present

## 2019-06-01 DIAGNOSIS — E559 Vitamin D deficiency, unspecified: Secondary | ICD-10-CM | POA: Diagnosis not present

## 2019-06-01 DIAGNOSIS — E785 Hyperlipidemia, unspecified: Secondary | ICD-10-CM | POA: Diagnosis not present

## 2019-06-01 LAB — COMPREHENSIVE METABOLIC PANEL
ALT: 15 U/L (ref 0–35)
AST: 16 U/L (ref 0–37)
Albumin: 4.3 g/dL (ref 3.5–5.2)
Alkaline Phosphatase: 70 U/L (ref 39–117)
BUN: 13 mg/dL (ref 6–23)
CO2: 32 mEq/L (ref 19–32)
Calcium: 9.7 mg/dL (ref 8.4–10.5)
Chloride: 101 mEq/L (ref 96–112)
Creatinine, Ser: 0.65 mg/dL (ref 0.40–1.20)
GFR: 94.71 mL/min (ref >60.00)
Glucose, Bld: 104 mg/dL — ABNORMAL HIGH (ref 70–99)
Potassium: 4 mEq/L (ref 3.5–5.1)
Sodium: 141 mEq/L (ref 135–145)
Total Bilirubin: 1.7 mg/dL — ABNORMAL HIGH (ref 0.2–1.2)
Total Protein: 7 g/dL (ref 6.0–8.3)

## 2019-06-01 LAB — LIPID PANEL
Cholesterol: 215 mg/dL — ABNORMAL HIGH (ref 0–200)
HDL: 49.5 mg/dL (ref >39.00)
LDL Cholesterol: 145 mg/dL — ABNORMAL HIGH (ref 0–99)
NonHDL: 165.34
Total CHOL/HDL Ratio: 4
Triglycerides: 103 mg/dL (ref 0.0–149.0)
VLDL: 20.6 mg/dL (ref 0.0–40.0)

## 2019-06-01 LAB — TSH: TSH: 2.62 u[IU]/mL (ref 0.35–4.50)

## 2019-06-01 LAB — VITAMIN D 25 HYDROXY (VIT D DEFICIENCY, FRACTURES): VITD: 40.97 ng/mL (ref 30.00–100.00)

## 2019-06-01 LAB — HEMOGLOBIN A1C: Hgb A1c MFr Bld: 5.4 % (ref 4.6–6.5)

## 2019-06-08 ENCOUNTER — Ambulatory Visit (INDEPENDENT_AMBULATORY_CARE_PROVIDER_SITE_OTHER): Payer: BC Managed Care – PPO | Admitting: Family Medicine

## 2019-06-08 ENCOUNTER — Other Ambulatory Visit: Payer: Self-pay

## 2019-06-08 ENCOUNTER — Encounter: Payer: Self-pay | Admitting: Family Medicine

## 2019-06-08 ENCOUNTER — Ambulatory Visit (INDEPENDENT_AMBULATORY_CARE_PROVIDER_SITE_OTHER)
Admission: RE | Admit: 2019-06-08 | Discharge: 2019-06-08 | Disposition: A | Discharge status: Home / Self Care | Payer: BC Managed Care – PPO | Source: Ambulatory Visit | Attending: Family Medicine | Admitting: Family Medicine

## 2019-06-08 VITALS — BP 118/82 | HR 88 | Temp 98.5°F | Ht 61.75 in | Wt 198.1 lb

## 2019-06-08 DIAGNOSIS — Z85118 Personal history of other malignant neoplasm of bronchus and lung: Secondary | ICD-10-CM

## 2019-06-08 DIAGNOSIS — M1811 Unilateral primary osteoarthritis of first carpometacarpal joint, right hand: Secondary | ICD-10-CM

## 2019-06-08 DIAGNOSIS — E559 Vitamin D deficiency, unspecified: Secondary | ICD-10-CM

## 2019-06-08 DIAGNOSIS — E7800 Pure hypercholesterolemia, unspecified: Secondary | ICD-10-CM

## 2019-06-08 DIAGNOSIS — Z0000 Encounter for general adult medical examination without abnormal findings: Secondary | ICD-10-CM | POA: Diagnosis not present

## 2019-06-08 DIAGNOSIS — I10 Essential (primary) hypertension: Secondary | ICD-10-CM

## 2019-06-08 DIAGNOSIS — R053 Chronic cough: Secondary | ICD-10-CM

## 2019-06-08 DIAGNOSIS — R1011 Right upper quadrant pain: Secondary | ICD-10-CM | POA: Diagnosis not present

## 2019-06-08 DIAGNOSIS — R05 Cough: Secondary | ICD-10-CM

## 2019-06-08 DIAGNOSIS — M79641 Pain in right hand: Secondary | ICD-10-CM | POA: Diagnosis not present

## 2019-06-08 DIAGNOSIS — E669 Obesity, unspecified: Secondary | ICD-10-CM

## 2019-06-08 DIAGNOSIS — E039 Hypothyroidism, unspecified: Secondary | ICD-10-CM

## 2019-06-08 MED ORDER — ALBUTEROL SULFATE HFA 108 (90 BASE) MCG/ACT IN AERS: 2.0000 | INHALATION_SPRAY | Freq: Four times a day (QID) | RESPIRATORY_TRACT | 3 refills | Status: AC | PRN

## 2019-06-08 MED ORDER — LOSARTAN POTASSIUM 25 MG PO TABS: 25.0000 mg | ORAL_TABLET | Freq: Every day | ORAL | 3 refills | Status: DC

## 2019-06-08 MED ORDER — HYDROCHLOROTHIAZIDE 25 MG PO TABS: ORAL_TABLET | ORAL | 3 refills | Status: DC

## 2019-06-08 MED ORDER — SPACER/AERO-HOLDING CHAMBERS DEVI: 0 refills | Status: AC

## 2019-06-08 MED ORDER — LEVOTHYROXINE SODIUM 75 MCG PO TABS: 75.0000 ug | ORAL_TABLET | Freq: Every day | ORAL | 3 refills | Status: DC

## 2019-06-08 NOTE — Assessment & Plan Note (Signed)
Largely improved - she cancelled HIDA scan. Yesterday noticed some RUQ discomfort returning.

## 2019-06-08 NOTE — Assessment & Plan Note (Addendum)
Stable levels on replacement.

## 2019-06-08 NOTE — Assessment & Plan Note (Signed)
Chronic, stable on current regimen.  

## 2019-06-08 NOTE — Progress Notes (Signed)
This visit was conducted in person.  BP 118/82 (BP Location: Left Arm, Patient Position: Sitting, Cuff Size: Large)   Pulse 88   Temp 98.5 F (36.9 C) (Temporal)   Ht 5' 1.75" (1.568 m)   Wt 198 lb 1 oz (89.8 kg)   SpO2 98%   BMI 36.52 kg/m    CC: CPE Subjective:    Patient ID: Monica [name], female    DOB: Feb 01, 1966, 53 y.o.   MRN: [id number]  HPI: Monica [name] is a 53 y.o. female presenting on 06/08/2019 for Annual Exam and Medication Refill (Requests new rx for Synthroid 75 mg. )   Husband had MI 4 wks ago - recovering well.   Stage IA lung adenocarcinoma s/p thoracoscopic RU lobectomy 06/2016 - incidentally found on CXR for sarcoidosis. Adjuvant chemo was not recommended. Followed regularly by onc [name]). Upcoming appt later this month.   Persistent nagging dry cough - daily PPI has been helpful. she is on losartan daily. She is not using inhalers.   2 wk h/o R base of thumb pain without inciting trauma or injury. Endorses remote h/o trigger thumb s/p steroid injections 10 yrs ago Monica [name]).   Preventative: Colonoscopy 09/2015 WNL [name]) Well woman yearly 08/2018 WNL ([name]) Mammo yearly with OBGYN - normal1/2020 LMP 2010 s/p endometrial ablation, h/o polyps BRCA gene negative Flu shot yearly  Tdap 10/2016  Shingrix - discussed, she wants this.  Seat belt use discussed Sunscreen use discussed. No changing moles on skin. Sees derm regularly. 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 Interim medical history since our last visit reviewed. Allergies and medications reviewed and updated. Outpatient Medications Prior to Visit   Medication Sig Dispense Refill  . Cholecalciferol (VITAMIN D-3) 1000 units CAPS Take 1,000 Units by mouth at bedtime.    . fluticasone (FLONASE) 50 MCG/ACT nasal spray Place 1 spray into both nostrils daily as needed for allergies. 16 g 0  . ibuprofen (ADVIL,MOTRIN) 200 MG tablet Take 400 mg by mouth every 6 (six) hours as needed for mild pain.     . valACYclovir (VALTREX) 1000 MG tablet Take 1,000 mg by mouth 2 (two) times daily as needed (fever blisters).    Marland Kitchen albuterol (PROVENTIL HFA;VENTOLIN HFA) 108 (90 Base) MCG/ACT inhaler Inhale 2 puffs into the lungs every 6 (six) hours as needed for wheezing or shortness of breath. 1 Inhaler 1  . hydrochlorothiazide (HYDRODIURIL) 25 MG tablet TAKE 1 TABLET(25 MG) BY MOUTH DAILY 90 tablet 0  . levothyroxine (SYNTHROID) 150 MCG tablet TAKE 1 TABLET(150 MCG) BY MOUTH DAILY BEFORE BREAKFAST (Patient taking differently: Takes 1/2 tablet daily) 90 tablet 0  . losartan (COZAAR) 25 MG tablet TAKE 1 TABLET(25 MG) BY MOUTH DAILY 90 tablet 0  . meloxicam (MOBIC) 15 MG tablet Take 15 mg by mouth daily.     No facility-administered medications prior to visit.      Per HPI unless specifically indicated in ROS section below Review of Systems  Constitutional: Negative for activity change, appetite change, chills, fatigue, fever and unexpected weight change.  HENT: Negative for hearing loss.   Eyes: Negative for visual disturbance.  Respiratory: Positive for cough (chronic nagging). Negative  for chest tightness, shortness of breath and wheezing.   Cardiovascular: Negative for chest pain, palpitations and leg swelling.  Gastrointestinal: Negative for abdominal distention, abdominal pain, blood in stool, constipation, diarrhea, nausea and vomiting.  Genitourinary: Negative for difficulty urinating and hematuria.  Musculoskeletal: Negative for arthralgias, myalgias and neck pain.  Skin: Negative for rash.  Neurological: Negative for dizziness, seizures, syncope and  headaches.  Hematological: Negative for adenopathy. Does not bruise/bleed easily.  Psychiatric/Behavioral: Negative for dysphoric mood. The patient is not nervous/anxious.    Objective:    BP 118/82 (BP Location: Left Arm, Patient Position: Sitting, Cuff Size: Large)   Pulse 88   Temp 98.5 F (36.9 C) (Temporal)   Ht 5' 1.75" (1.568 m)   Wt 198 lb 1 oz (89.8 kg)   SpO2 98%   BMI 36.52 kg/m   Wt Readings from Last 3 Encounters:  06/08/19 198 lb 1 oz (89.8 kg)  11/17/18 200 lb 5 oz (90.9 kg)  10/02/18 198 lb 3 oz (89.9 kg)    Physical Exam Vitals signs and nursing note reviewed.  Constitutional:      General: She is not in acute distress.    Appearance: Normal appearance. She is well-developed. She is obese. She is not ill-appearing.  HENT:     Head: Normocephalic and atraumatic.     Right Ear: Hearing, tympanic membrane, ear canal and external ear normal.     Left Ear: Hearing, tympanic membrane, ear canal and external ear normal.     Nose: Nose normal.     Mouth/Throat:     Mouth: Mucous membranes are moist.     Pharynx: Oropharynx is clear. Uvula midline. No posterior oropharyngeal erythema.  Eyes:     General: No scleral icterus.    Extraocular Movements: Extraocular movements intact.     Conjunctiva/sclera: Conjunctivae normal.     Pupils: Pupils are equal, round, and reactive to light.  Neck:     Musculoskeletal: Normal range of motion and neck supple.  Cardiovascular:     Rate and Rhythm: Normal rate and regular rhythm.     Pulses: Normal pulses.          Radial pulses are 2+ on the right side and 2+ on the left side.     Heart sounds: Normal heart sounds. No murmur.  Pulmonary:     Effort: Pulmonary effort is normal. No respiratory distress.     Breath sounds: Normal breath sounds. No wheezing, rhonchi or rales.  Abdominal:     General: Abdomen is flat. Bowel sounds are normal. There is no distension.     Palpations: Abdomen is soft. There is no mass.      Tenderness: There is no abdominal tenderness. There is no guarding or rebound.     Hernia: No hernia is present.  Musculoskeletal: Normal range of motion.     Right lower leg: No edema.     Left lower leg: No edema.  Lymphadenopathy:     Cervical: No cervical adenopathy.  Skin:    General: Skin is warm and dry.     Findings: No rash.  Neurological:     General: No focal deficit present.     Mental Status: She is alert and oriented to person, place, and time.     Comments: CN grossly intact, station and gait intact  Psychiatric:        Mood and Affect: Mood normal.        Behavior: Behavior normal.  Thought Content: Thought content normal.        Judgment: Judgment normal.       Results for orders placed or performed in visit on 06/01/19  Hemoglobin A1c  Result Value Ref Range   Hgb A1c MFr Bld 5.4 4.6 - 6.5 %  VITAMIN D 25 Hydroxy (Vit-D Deficiency, Fractures)  Result Value Ref Range   VITD 40.97 30.00 - 100.00 ng/mL  TSH  Result Value Ref Range   TSH 2.62 0.35 - 4.50 uIU/mL  Comprehensive metabolic panel  Result Value Ref Range   Sodium 141 135 - 145 mEq/L   Potassium 4.0 3.5 - 5.1 mEq/L   Chloride 101 96 - 112 mEq/L   CO2 32 19 - 32 mEq/L   Glucose, Bld 104 (H) 70 - 99 mg/dL   BUN 13 6 - 23 mg/dL   Creatinine, Ser 0.65 0.40 - 1.20 mg/dL   Total Bilirubin 1.7 (H) 0.2 - 1.2 mg/dL   Alkaline Phosphatase 70 39 - 117 U/L   AST 16 0 - 37 U/L   ALT 15 0 - 35 U/L   Total Protein 7.0 6.0 - 8.3 g/dL   Albumin 4.3 3.5 - 5.2 g/dL   Calcium 9.7 8.4 - 10.5 mg/dL   GFR 94.71 >60.00 mL/min  Lipid panel  Result Value Ref Range   Cholesterol 215 (H) 0 - 200 mg/dL   Triglycerides 103.0 0.0 - 149.0 mg/dL   HDL 49.50 >39.00 mg/dL   VLDL 20.6 0.0 - 40.0 mg/dL   LDL Cholesterol 145 (H) 0 - 99 mg/dL   Total CHOL/HDL Ratio 4    NonHDL 165.34    Assessment & Plan:   Problem List Items Addressed This Visit    Vitamin D deficiency    Stable levels on replacement.        RUQ abdominal pain    Largely improved - she cancelled HIDA scan. Yesterday noticed some RUQ discomfort returning.       Primary osteoarthritis of first carpometacarpal joint of right hand    Update xray. She has previously seen Dr Monica [name].       Relevant Orders   DG Hand Complete Right   Obesity, Class II, BMI 35-39.9, no comorbidity    Encouraged healthy diet and lifestyle changes to affect sustainable weight loss. She is motivated to start walking routine.       Hypothyroidism    Chronic, stable. Continue current regimen.       Relevant Medications   levothyroxine (SYNTHROID) 75 MCG tablet   Hyperlipidemia    Chronic, stable off meds. Discussed low chol diet, handout provided.  The 10-year ASCVD risk score Mikey Bussing [name]., et al., 2013) is: 2.5%   Values used to calculate the score:     Age: 62 years     Sex: Female     Is Non-Hispanic African American: No     Diabetic: No     Tobacco smoker: No     Systolic Blood Pressure: 161 mmHg     Is BP treated: Yes     HDL Cholesterol: 49.5 mg/dL     Total Cholesterol: 215 mg/dL       Relevant Medications   hydrochlorothiazide (HYDRODIURIL) 25 MG tablet   losartan (COZAAR) 25 MG tablet   HTN (hypertension)    Chronic, stable on current regimen.       Relevant Medications   hydrochlorothiazide (HYDRODIURIL) 25 MG tablet   losartan (COZAAR) 25 MG tablet   History of adenocarcinoma  of lung   Health maintenance examination - Primary    Preventative protocols reviewed and updated unless pt declined. Discussed healthy diet and lifestyle.       Chronic cough    Discussed trial albuterol with spacer as she has difficulty with correct administration of albuterol. If ineffective, will consider trial off losartan. She agrees with plan.           Meds ordered this encounter  Medications  . Spacer/Aero-Holding Chambers DEVI    Sig: Use as directed with albuterol inhaler    Dispense:  1 Units    Refill:  0  . albuterol (VENTOLIN  HFA) 108 (90 Base) MCG/ACT inhaler    Sig: Inhale 2 puffs into the lungs every 6 (six) hours as needed for wheezing or shortness of breath.    Dispense:  18 g    Refill:  3  . levothyroxine (SYNTHROID) 75 MCG tablet    Sig: Take 1 tablet (75 mcg total) by mouth daily before breakfast.    Dispense:  90 tablet    Refill:  3    Hold until next due  . hydrochlorothiazide (HYDRODIURIL) 25 MG tablet    Sig: TAKE 1 TABLET(25 MG) BY MOUTH DAILY    Dispense:  90 tablet    Refill:  3  . losartan (COZAAR) 25 MG tablet    Sig: Take 1 tablet (25 mg total) by mouth daily.    Dispense:  90 tablet    Refill:  3   Orders Placed This Encounter  Procedures  . DG Hand Complete Right    Standing Status:   Future    Standing Expiration Date:   08/07/2020    Order Specific Question:   Reason for Exam (SYMPTOM  OR DIAGNOSIS REQUIRED)    Answer:   R 1st CMC pain    Order Specific Question:   Is patient pregnant?    Answer:   No    Order Specific Question:   Preferred imaging location?    Answer:   [facility]    Order Specific Question:   Radiology Contrast Protocol - do NOT remove file path    Answer:   [email or web address]    Patient instructions: Use albuterol inhaler with spacer (sent to pharmacy) We will request records from Dr Radene Knee for mammogram and pap smear Price out shingrix series vaccine.  I think you have arthritis of 1st R CMC joint (thumb base). Treat with short course of ibuprofen or aleve, OTC topical voltaren, continue thumb brace. If worsening let me know for referral to hand surgery.  xrays today.  RTC 1 yr CPE  Follow up plan: Return in about 1 year (around 06/07/2020) for annual exam, prior fasting for blood work.  [name], MD

## 2019-06-08 NOTE — Assessment & Plan Note (Signed)
Update xray. She has previously seen Dr [name].

## 2019-06-08 NOTE — Assessment & Plan Note (Signed)
Preventative protocols reviewed and updated unless pt declined. Discussed healthy diet and lifestyle.  

## 2019-06-08 NOTE — Assessment & Plan Note (Signed)
Discussed trial albuterol with spacer as she has difficulty with correct administration of albuterol. If ineffective, will consider trial off losartan. She agrees with plan.

## 2019-06-08 NOTE — Assessment & Plan Note (Signed)
Chronic, stable. Continue current regimen. 

## 2019-06-08 NOTE — Assessment & Plan Note (Signed)
Encouraged healthy diet and lifestyle changes to affect sustainable weight loss. She is motivated to start walking routine.

## 2019-06-08 NOTE — Patient Instructions (Addendum)
Use albuterol inhaler with spacer (sent to pharmacy) We will request records from Dr Radene Knee for mammogram and pap smear Price out shingrix series vaccine.  I think you have arthritis of 1st R CMC joint (thumb base). Treat with short course of ibuprofen or aleve, OTC topical voltaren, continue thumb brace. If worsening let me know for referral to hand surgery.  xrays today.  RTC 1 yr CPE  Health Maintenance, Female Adopting a healthy lifestyle and getting preventive care are important in promoting health and wellness. Ask your health care provider about:  The right schedule for you to have regular tests and exams.  Things you can do on your own to prevent diseases and keep yourself healthy. What should I know about diet, weight, and exercise? Eat a healthy diet   Eat a diet that includes plenty of vegetables, fruits, low-fat dairy products, and lean protein.  Do not eat a lot of foods that are high in solid fats, added sugars, or sodium. Maintain a healthy weight Body mass index (BMI) is used to identify weight problems. It estimates body fat based on height and weight. Your health care provider can help determine your BMI and help you achieve or maintain a healthy weight. Get regular exercise Get regular exercise. This is one of the most important things you can do for your health. Most adults should:  Exercise for at least 150 minutes each week. The exercise should increase your heart rate and make you sweat (moderate-intensity exercise).  Do strengthening exercises at least twice a week. This is in addition to the moderate-intensity exercise.  Spend less time sitting. Even light physical activity can be beneficial. Watch cholesterol and blood lipids Have your blood tested for lipids and cholesterol at 54 years of age, then have this test every 5 years. Have your cholesterol levels checked more often if:  Your lipid or cholesterol levels are high.  You are older than 54 years of  age.  You are at high risk for heart disease. What should I know about cancer screening? Depending on your health history and family history, you may need to have cancer screening at various ages. This may include screening for:  Breast cancer.  Cervical cancer.  Colorectal cancer.  Skin cancer.  Lung cancer. What should I know about heart disease, diabetes, and high blood pressure? Blood pressure and heart disease  High blood pressure causes heart disease and increases the risk of stroke. This is more likely to develop in people who have high blood pressure readings, are of African descent, or are overweight.  Have your blood pressure checked: ? Every 3-5 years if you are 37-58 years of age. ? Every year if you are 55 years old or older. Diabetes Have regular diabetes screenings. This checks your fasting blood sugar level. Have the screening done:  Once every three years after age 62 if you are at a normal weight and have a low risk for diabetes.  More often and at a younger age if you are overweight or have a high risk for diabetes. What should I know about preventing infection? Hepatitis B If you have a higher risk for hepatitis B, you should be screened for this virus. Talk with your health care provider to find out if you are at risk for hepatitis B infection. Hepatitis C Testing is recommended for:  Everyone born from [birth year] through 1966.  Anyone with known risk factors for hepatitis C. Sexually transmitted infections (STIs)  Get screened for STIs,  including gonorrhea and chlamydia, if: ? You are sexually active and are younger than 54 years of age. ? You are older than 54 years of age and your health care provider tells you that you are at risk for this type of infection. ? Your sexual activity has changed since you were last screened, and you are at increased risk for chlamydia or gonorrhea. Ask your health care provider if you are at risk.  Ask your health care  provider about whether you are at high risk for HIV. Your health care provider may recommend a prescription medicine to help prevent HIV infection. If you choose to take medicine to prevent HIV, you should first get tested for HIV. You should then be tested every 3 months for as long as you are taking the medicine. Pregnancy  If you are about to stop having your period (premenopausal) and you may become pregnant, seek counseling before you get pregnant.  Take 400 to 800 micrograms (mcg) of folic acid every day if you become pregnant.  Ask for birth control (contraception) if you want to prevent pregnancy. Osteoporosis and menopause Osteoporosis is a disease in which the bones lose minerals and strength with aging. This can result in bone fractures. If you are 16 years old or older, or if you are at risk for osteoporosis and fractures, ask your health care provider if you should:  Be screened for bone loss.  Take a calcium or vitamin D supplement to lower your risk of fractures.  Be given hormone replacement therapy (HRT) to treat symptoms of menopause. Follow these instructions at home: Lifestyle  Do not use any products that contain nicotine or tobacco, such as cigarettes, e-cigarettes, and chewing tobacco. If you need help quitting, ask your health care provider.  Do not use street drugs.  Do not share needles.  Ask your health care provider for help if you need support or information about quitting drugs. Alcohol use  Do not drink alcohol if: ? Your health care provider tells you not to drink. ? You are pregnant, may be pregnant, or are planning to become pregnant.  If you drink alcohol: ? Limit how much you use to 0-1 drink a day. ? Limit intake if you are breastfeeding.  Be aware of how much alcohol is in your drink. In the [place] one drink equals one 12 oz bottle of beer (355 mL), one 5 oz glass of wine (148 mL), or one 1 oz glass of hard liquor (44 mL). General  instructions  Schedule regular health, dental, and eye exams.  Stay current with your vaccines.  Tell your health care provider if: ? You often feel depressed. ? You have ever been abused or do not feel safe at home. Summary  Adopting a healthy lifestyle and getting preventive care are important in promoting health and wellness.  Follow your health care provider's instructions about healthy diet, exercising, and getting tested or screened for diseases.  Follow your health care provider's instructions on monitoring your cholesterol and blood pressure. This information is not intended to replace advice given to you by your health care provider. Make sure you discuss any questions you have with your health care provider. Document Released: 02/01/2011 Document Revised: 07/12/2018 Document Reviewed: 07/12/2018 Elsevier Patient Education  2020 [employer].

## 2019-06-08 NOTE — Assessment & Plan Note (Addendum)
Chronic, stable off meds. Discussed low chol diet, handout provided.  The 10-year ASCVD risk score Mikey Bussing [name]., et al., 2013) is: 2.5%   Values used to calculate the score:     Age: 53 years     Sex: Female     Is Non-Hispanic African American: No     Diabetic: No     Tobacco smoker: No     Systolic Blood Pressure: 830 mmHg     Is BP treated: Yes     HDL Cholesterol: 49.5 mg/dL     Total Cholesterol: 215 mg/dL

## 2019-06-14 ENCOUNTER — Other Ambulatory Visit: Payer: Self-pay

## 2019-06-14 DIAGNOSIS — C3490 Malignant neoplasm of unspecified part of unspecified bronchus or lung: Secondary | ICD-10-CM

## 2019-06-15 ENCOUNTER — Ambulatory Visit (HOSPITAL_COMMUNITY)
Admission: RE | Admit: 2019-06-15 | Discharge: 2019-06-15 | Disposition: A | Discharge status: Home / Self Care | Payer: BC Managed Care – PPO | Source: Ambulatory Visit | Attending: Internal Medicine | Admitting: Internal Medicine

## 2019-06-15 ENCOUNTER — Other Ambulatory Visit: Payer: Self-pay

## 2019-06-15 ENCOUNTER — Inpatient Hospital Stay: Payer: BC Managed Care – PPO | Attending: Internal Medicine

## 2019-06-15 DIAGNOSIS — C3490 Malignant neoplasm of unspecified part of unspecified bronchus or lung: Secondary | ICD-10-CM | POA: Diagnosis not present

## 2019-06-15 DIAGNOSIS — C3411 Malignant neoplasm of upper lobe, right bronchus or lung: Secondary | ICD-10-CM | POA: Insufficient documentation

## 2019-06-15 LAB — CMP (CANCER CENTER ONLY)
ALT: 17 U/L (ref 0–44)
AST: 15 U/L (ref 15–41)
Albumin: 3.9 g/dL (ref 3.5–5.0)
Alkaline Phosphatase: 73 U/L (ref 38–126)
Anion gap: 10 (ref 5–15)
BUN: 14 mg/dL (ref 6–20)
CO2: 31 mmol/L (ref 22–32)
Calcium: 9.5 mg/dL (ref 8.9–10.3)
Chloride: 102 mmol/L (ref 98–111)
Creatinine: 0.73 mg/dL (ref 0.44–1.00)
GFR, Est AFR Am: >60 mL/min (ref >60)
GFR, Estimated: >60 mL/min (ref >60)
Glucose, Bld: 106 mg/dL — ABNORMAL HIGH (ref 70–99)
Potassium: 4.2 mmol/L (ref 3.5–5.1)
Sodium: 143 mmol/L (ref 135–145)
Total Bilirubin: 1.5 mg/dL — ABNORMAL HIGH (ref 0.3–1.2)
Total Protein: 7.1 g/dL (ref 6.5–8.1)

## 2019-06-15 MED ORDER — SODIUM CHLORIDE (PF) 0.9 % IJ SOLN
INTRAMUSCULAR | Status: AC
  Filled 2019-06-15: qty 50

## 2019-06-15 MED ORDER — IOHEXOL 300 MG/ML  SOLN
75.0000 mL | Freq: Once | INTRAMUSCULAR | Status: AC | PRN
  Administered 2019-06-15: 75 mL via INTRAVENOUS

## 2019-06-19 ENCOUNTER — Inpatient Hospital Stay: Payer: BC Managed Care – PPO | Admitting: Internal Medicine

## 2019-06-19 ENCOUNTER — Other Ambulatory Visit: Payer: Self-pay

## 2019-06-19 ENCOUNTER — Encounter: Payer: Self-pay | Admitting: *Deleted

## 2019-06-19 ENCOUNTER — Telehealth: Payer: Self-pay | Admitting: Internal Medicine

## 2019-06-19 ENCOUNTER — Inpatient Hospital Stay: Payer: BC Managed Care – PPO

## 2019-06-19 VITALS — BP 134/93 | HR 78 | Temp 98.6°F | Resp 16 | Ht 61.75 in | Wt 199.4 lb

## 2019-06-19 DIAGNOSIS — I10 Essential (primary) hypertension: Secondary | ICD-10-CM

## 2019-06-19 DIAGNOSIS — C3490 Malignant neoplasm of unspecified part of unspecified bronchus or lung: Secondary | ICD-10-CM | POA: Diagnosis not present

## 2019-06-19 DIAGNOSIS — Z85118 Personal history of other malignant neoplasm of bronchus and lung: Secondary | ICD-10-CM

## 2019-06-19 DIAGNOSIS — C3411 Malignant neoplasm of upper lobe, right bronchus or lung: Secondary | ICD-10-CM | POA: Diagnosis not present

## 2019-06-19 LAB — CMP (CANCER CENTER ONLY)
ALT: 20 U/L (ref 0–44)
AST: 17 U/L (ref 15–41)
Albumin: 4 g/dL (ref 3.5–5.0)
Alkaline Phosphatase: 74 U/L (ref 38–126)
Anion gap: 11 (ref 5–15)
BUN: 14 mg/dL (ref 6–20)
CO2: 28 mmol/L (ref 22–32)
Calcium: 9.5 mg/dL (ref 8.9–10.3)
Chloride: 101 mmol/L (ref 98–111)
Creatinine: 0.75 mg/dL (ref 0.44–1.00)
GFR, Est AFR Am: >60 mL/min (ref >60)
GFR, Estimated: >60 mL/min (ref >60)
Glucose, Bld: 112 mg/dL — ABNORMAL HIGH (ref 70–99)
Potassium: 4.2 mmol/L (ref 3.5–5.1)
Sodium: 140 mmol/L (ref 135–145)
Total Bilirubin: 1.9 mg/dL — ABNORMAL HIGH (ref 0.3–1.2)
Total Protein: 7.3 g/dL (ref 6.5–8.1)

## 2019-06-19 LAB — CBC WITH DIFFERENTIAL (CANCER CENTER ONLY)
Abs Immature Granulocytes: 0.02 10*3/uL (ref 0.00–0.07)
Basophils Absolute: 0.1 10*3/uL (ref 0.0–0.1)
Basophils Relative: 1 %
Eosinophils Absolute: 0.2 10*3/uL (ref 0.0–0.5)
Eosinophils Relative: 2 %
HCT: 40 % (ref 36.0–46.0)
Hemoglobin: 13.5 g/dL (ref 12.0–15.0)
Immature Granulocytes: 0 %
Lymphocytes Relative: 22 %
Lymphs Abs: 2.1 10*3/uL (ref 0.7–4.0)
MCH: 27.6 pg (ref 26.0–34.0)
MCHC: 33.8 g/dL (ref 30.0–36.0)
MCV: 81.8 fL (ref 80.0–100.0)
Monocytes Absolute: 0.5 10*3/uL (ref 0.1–1.0)
Monocytes Relative: 5 %
Neutro Abs: 6.6 10*3/uL (ref 1.7–7.7)
Neutrophils Relative %: 70 %
Platelet Count: 295 10*3/uL (ref 150–400)
RBC: 4.89 MIL/uL (ref 3.87–5.11)
RDW: 13.3 % (ref 11.5–15.5)
WBC Count: 9.5 10*3/uL (ref 4.0–10.5)
nRBC: 0 % (ref 0.0–0.2)

## 2019-06-19 NOTE — Progress Notes (Signed)
[place] [phone]   [phone]  OFFICE PROGRESS NOTE  Monica Bush, MD [place] [address]  DIAGNOSIS: Stage IA (T1a, N0, M0) non-small cell lung cancer, adenocarcinoma presented with right upper lobe pulmonary nodule diagnosed in November 2017.  PRIOR THERAPY:status post right upper lobectomy with lymph node dissection in November 2017 under the care of Dr. Roxan [name].  CURRENT THERAPY: Observation.  INTERVAL HISTORY: Monica [name] 53 y.o. female returns to the clinic today for annual follow-up visit.  The patient is feeling fine today with no concerning complaints.  She denied having any chest pain, shortness of breath, cough or hemoptysis.  She has no recent weight loss or night sweats.  She has no nausea, vomiting, diarrhea or constipation.  The patient had repeat CT scan of the chest performed recently and she is here for evaluation and discussion of her scan results.  MEDICAL HISTORY: Past Medical History:  Diagnosis Date  . Essential hypertension   . History of chicken pox   . Hyperbilirubinemia 01/19/2017  . Hyperlipidemia   . Hypothyroidism   . Primary adenocarcinoma of upper lobe of right lung ([place]) 05/28/2016   RUL apex spiculated ~2cm nodule concerning by PET scan - referred to multidisciplinary thoracic oncology clinic S/p thoracoscopic RULobectomy Monica [name]) 06/2016  . Sarcoidosis, lung (Monica [name]) ~2004   treated with prendisone and stable since then    ALLERGIES:  has No Known Allergies.  MEDICATIONS:  Current Outpatient Medications  Medication Sig Dispense Refill  . albuterol (VENTOLIN HFA) 108 (90 Base) MCG/ACT inhaler Inhale 2 puffs into the lungs every 6 (six) hours as needed for wheezing or shortness of breath. 18 g 3  . Cholecalciferol (VITAMIN D-3) 1000 units CAPS Take 1,000 Units by mouth at bedtime.    . fluticasone (FLONASE) 50 MCG/ACT nasal spray Place 1 spray into both  nostrils daily as needed for allergies. 16 g 0  . hydrochlorothiazide (HYDRODIURIL) 25 MG tablet TAKE 1 TABLET(25 MG) BY MOUTH DAILY 90 tablet 3  . ibuprofen (ADVIL,MOTRIN) 200 MG tablet Take 400 mg by mouth every 6 (six) hours as needed for mild pain.     Marland Kitchen levothyroxine (SYNTHROID) 75 MCG tablet Take 1 tablet (75 mcg total) by mouth daily before breakfast. 90 tablet 3  . losartan (COZAAR) 25 MG tablet Take 1 tablet (25 mg total) by mouth daily. 90 tablet 3  . Spacer/Aero-Holding Dorise Bullion Use as directed with albuterol inhaler 1 Units 0  . valACYclovir (VALTREX) 1000 MG tablet Take 1,000 mg by mouth 2 (two) times daily as needed (fever blisters).     No current facility-administered medications for this visit.     SURGICAL HISTORY:  Past Surgical History:  Procedure Laterality Date  . CARDIOVASCULAR STRESS TEST  12/2016   Nuclear CP stress test: hypertensive response to exercise, normal low risk study, EF 68%  . COLONOSCOPY  09/2015   WNL Monica [name])  . ENDOMETRIAL ABLATION W/ NOVASURE  01/2009   Archie Endo 12/02/2010  . LOBECTOMY Right 06/14/2016   RU Lobectomy; Surgeon: Monica Nakayama, MD  . LYMPH NODE DISSECTION Right 06/14/2016   RIGHT UPPER LOBE LYMPH NODE DISSECTION;  Surgeon: Monica Nakayama, MD  . [place] Bilateral 1998  . TUBAL LIGATION  2001  . VIDEO ASSISTED THORACOSCOPY (VATS)/WEDGE RESECTION Right 06/14/2016   Surgeon: Monica Nakayama, MD    REVIEW OF SYSTEMS:  A comprehensive review of systems was negative.   PHYSICAL  EXAMINATION: General appearance: alert, cooperative and no distress Head: Normocephalic, without obvious abnormality, atraumatic Neck: no adenopathy, no JVD, supple, symmetrical, trachea midline and thyroid not enlarged, symmetric, no tenderness/mass/nodules Lymph nodes: Cervical, supraclavicular, and axillary nodes normal. Resp: clear to auscultation bilaterally Back: symmetric, no curvature. ROM normal. No CVA tenderness.  Cardio: regular rate and rhythm, S1, S2 normal, no murmur, click, rub or gallop GI: soft, non-tender; bowel sounds normal; no masses,  no organomegaly Extremities: extremities normal, atraumatic, no cyanosis or edema  ECOG PERFORMANCE STATUS: 0 - Asymptomatic  Blood pressure (!) 134/93, pulse 78, temperature 98.6 F (37 C), temperature source Temporal, resp. rate 16, height 5' 1.75" (1.568 m), weight 199 lb 6.4 oz (90.4 kg), SpO2 100 %.  LABORATORY DATA: Lab Results  Component Value Date   WBC 9.5 06/19/2019   HGB 13.5 06/19/2019   HCT 40.0 06/19/2019   MCV 81.8 06/19/2019   PLT 295 06/19/2019      Chemistry      Component Value Date/Time   NA 143 06/15/2019 0945   NA 140 07/20/2017 1338   K 4.2 06/15/2019 0945   K 4.1 07/20/2017 1338   CL 102 06/15/2019 0945   CO2 31 06/15/2019 0945   CO2 26 07/20/2017 1338   BUN 14 06/15/2019 0945   BUN 13.4 07/20/2017 1338   CREATININE 0.73 06/15/2019 0945   CREATININE 0.8 07/20/2017 1338      Component Value Date/Time   CALCIUM 9.5 06/15/2019 0945   CALCIUM 9.7 07/20/2017 1338   ALKPHOS 73 06/15/2019 0945   ALKPHOS 77 07/20/2017 1338   AST 15 06/15/2019 0945   AST 15 07/20/2017 1338   ALT 17 06/15/2019 0945   ALT 12 07/20/2017 1338   BILITOT 1.5 (H) 06/15/2019 0945   BILITOT 1.71 (H) 07/20/2017 1338       RADIOGRAPHIC STUDIES: Ct Chest W Contrast  Result Date: 06/15/2019 CLINICAL DATA:  Non-small-cell lung cancer, restaging. Diagnosed in 2017 with surgery only. Right upper lobectomy. EXAM: CT CHEST WITH CONTRAST TECHNIQUE: Multidetector CT imaging of the chest was performed during intravenous contrast administration. CONTRAST:  36mL OMNIPAQUE IOHEXOL 300 MG/ML  SOLN COMPARISON:  07/28/2018 FINDINGS: Cardiovascular: Aortic atherosclerosis. Tortuous thoracic aorta. Normal heart size, without pericardial effusion. No central pulmonary embolism, on this non-dedicated study. Mediastinum/Nodes: No supraclavicular adenopathy. A  mildly enlarged right hilar/infrahilar node measures 9 mm on 65/2 versus 11 mm on the prior. Prevascular nodes are similar and not pathologic by size criteria. Tiny hiatal hernia. Lungs/Pleura: No pleural fluid.  Right upper lobectomy. Bilateral pulmonary nodules are identified on series 5 and are similar. Example left apical nodule of 4 mm on 25/5 is not significantly changed. Some of these nodules are similarly cavitary (example right apex image 34/5) and ill-defined, suggesting ground-glass morphology (example right lower lobe on 52/5). No enlarging or dominant nodules identified. Upper Abdomen: Normal imaged portions of the liver, spleen, pancreas, gallbladder, adrenal glands, kidneys. Mildly prominent porta hepatis nodes are unchanged. Musculoskeletal: No acute osseous abnormality. IMPRESSION: 1. Status post right upper lobectomy, without recurrent or metastatic disease. 2. Similar bilateral pulmonary nodules, consistent with a benign etiology. 3. Right hilar/infrahilar borderline size lymph node, similar to minimally decreased in size. Electronically Signed   By: Abigail Miyamoto M.D.   On: 06/15/2019 11:55   Dg Hand Complete Right  Result Date: 06/08/2019 CLINICAL DATA:  Right 1st [facility] pain EXAM: RIGHT HAND - COMPLETE 3+ VIEW COMPARISON:  None. FINDINGS: No fracture or dislocation is seen. The joint  spaces are preserved. The visualized soft tissues are unremarkable. IMPRESSION: Negative. Electronically Signed   By: Julian Hy M.D.   On: 06/08/2019 19:55    ASSESSMENT AND PLAN: This is a very pleasant 53 years old white female with stage IA non-small cell lung cancer, adenocarcinoma presented with right upper lobe pulmonary nodule is status post right upper lobectomy with lymph node dissection in November 2017 under the care of Dr. Roxan [name]. She is currently on observation and she is feeling fine with no concerning complaints. She had repeat CT scan of the chest performed recently.  I personally  and independently reviewed the scans and discussed the results with the patient today. Her scan showed no concerning findings for disease recurrence or metastasis. I recommended for her to continue on observation with repeat CT scan of the chest in 1 year. She was advised to call immediately if she has any concerning symptoms in the interval. The patient voices understanding of current disease status and treatment options and is in agreement with the current care plan. All questions were answered. The patient knows to call the clinic with any problems, questions or concerns. We can certainly see the patient much sooner if necessary. I spent 10 minutes counseling the patient face to face. The total time spent in the appointment was 15 minutes. Disclaimer: This note was dictated with voice recognition software. Similar sounding words can inadvertently be transcribed and may not be corrected upon review.

## 2019-06-19 NOTE — Telephone Encounter (Signed)
Scheduled appt 11/17 los.  Printed calendar and avs.

## 2019-07-23 ENCOUNTER — Other Ambulatory Visit: Payer: BLUE CROSS/BLUE SHIELD

## 2019-08-01 ENCOUNTER — Ambulatory Visit: Payer: BLUE CROSS/BLUE SHIELD | Admitting: Internal Medicine

## 2019-09-07 ENCOUNTER — Encounter (HOSPITAL_COMMUNITY): Payer: Self-pay | Admitting: Emergency Medicine

## 2019-09-07 ENCOUNTER — Telehealth: Payer: Self-pay

## 2019-09-07 ENCOUNTER — Emergency Department (HOSPITAL_COMMUNITY): Payer: BC Managed Care – PPO

## 2019-09-07 ENCOUNTER — Ambulatory Visit (HOSPITAL_COMMUNITY): Payer: BC Managed Care – PPO

## 2019-09-07 ENCOUNTER — Emergency Department (HOSPITAL_COMMUNITY)
Admission: EM | Admit: 2019-09-07 | Discharge: 2019-09-07 | Disposition: A | Discharge status: Home / Self Care | Payer: BC Managed Care – PPO | Attending: Emergency Medicine | Admitting: Emergency Medicine

## 2019-09-07 DIAGNOSIS — K429 Umbilical hernia without obstruction or gangrene: Secondary | ICD-10-CM | POA: Diagnosis not present

## 2019-09-07 DIAGNOSIS — I10 Essential (primary) hypertension: Secondary | ICD-10-CM | POA: Insufficient documentation

## 2019-09-07 DIAGNOSIS — R1011 Right upper quadrant pain: Secondary | ICD-10-CM

## 2019-09-07 DIAGNOSIS — R1031 Right lower quadrant pain: Secondary | ICD-10-CM | POA: Diagnosis not present

## 2019-09-07 DIAGNOSIS — Z85118 Personal history of other malignant neoplasm of bronchus and lung: Secondary | ICD-10-CM | POA: Diagnosis not present

## 2019-09-07 DIAGNOSIS — R109 Unspecified abdominal pain: Secondary | ICD-10-CM

## 2019-09-07 DIAGNOSIS — Z79899 Other long term (current) drug therapy: Secondary | ICD-10-CM | POA: Diagnosis not present

## 2019-09-07 DIAGNOSIS — R197 Diarrhea, unspecified: Secondary | ICD-10-CM | POA: Diagnosis not present

## 2019-09-07 DIAGNOSIS — R110 Nausea: Secondary | ICD-10-CM | POA: Diagnosis not present

## 2019-09-07 DIAGNOSIS — E039 Hypothyroidism, unspecified: Secondary | ICD-10-CM | POA: Insufficient documentation

## 2019-09-07 DIAGNOSIS — E876 Hypokalemia: Secondary | ICD-10-CM

## 2019-09-07 LAB — URINALYSIS, ROUTINE W REFLEX MICROSCOPIC
Bilirubin Urine: NEGATIVE
Glucose, UA: NEGATIVE mg/dL
Hgb urine dipstick: NEGATIVE
Ketones, ur: NEGATIVE mg/dL
Leukocytes,Ua: NEGATIVE
Nitrite: NEGATIVE
Protein, ur: NEGATIVE mg/dL
Specific Gravity, Urine: 1.005 (ref 1.005–1.030)
pH: 7 (ref 5.0–8.0)

## 2019-09-07 LAB — COMPREHENSIVE METABOLIC PANEL
ALT: 18 U/L (ref 0–44)
AST: 20 U/L (ref 15–41)
Albumin: 4.2 g/dL (ref 3.5–5.0)
Alkaline Phosphatase: 75 U/L (ref 38–126)
Anion gap: 15 (ref 5–15)
BUN: 9 mg/dL (ref 6–20)
CO2: 22 mmol/L (ref 22–32)
Calcium: 9.3 mg/dL (ref 8.9–10.3)
Chloride: 100 mmol/L (ref 98–111)
Creatinine, Ser: 0.74 mg/dL (ref 0.44–1.00)
GFR calc Af Amer: >60 mL/min (ref >60)
GFR calc non Af Amer: >60 mL/min (ref >60)
Glucose, Bld: 119 mg/dL — ABNORMAL HIGH (ref 70–99)
Potassium: 3.2 mmol/L — ABNORMAL LOW (ref 3.5–5.1)
Sodium: 137 mmol/L (ref 135–145)
Total Bilirubin: 2.3 mg/dL — ABNORMAL HIGH (ref 0.3–1.2)
Total Protein: 7.5 g/dL (ref 6.5–8.1)

## 2019-09-07 LAB — CBC
HCT: 41.8 % (ref 36.0–46.0)
Hemoglobin: 13.9 g/dL (ref 12.0–15.0)
MCH: 26.9 pg (ref 26.0–34.0)
MCHC: 33.3 g/dL (ref 30.0–36.0)
MCV: 80.9 fL (ref 80.0–100.0)
Platelets: 362 10*3/uL (ref 150–400)
RBC: 5.17 MIL/uL — ABNORMAL HIGH (ref 3.87–5.11)
RDW: 12.8 % (ref 11.5–15.5)
WBC: 10.1 10*3/uL (ref 4.0–10.5)
nRBC: 0 % (ref 0.0–0.2)

## 2019-09-07 LAB — LIPASE, BLOOD: Lipase: 25 U/L (ref 11–51)

## 2019-09-07 MED ORDER — SODIUM CHLORIDE 0.9% FLUSH
3.0000 mL | Freq: Once | INTRAVENOUS | Status: AC
  Administered 2019-09-07: 3 mL via INTRAVENOUS

## 2019-09-07 MED ORDER — SODIUM CHLORIDE 0.9 % IV BOLUS
1000.0000 mL | Freq: Once | INTRAVENOUS | Status: AC
  Administered 2019-09-07: 1000 mL via INTRAVENOUS

## 2019-09-07 NOTE — ED Triage Notes (Signed)
Pt here from home with c/o right lower quadrant pain that started about 2 am along with some nausea , pt still has her appendix

## 2019-09-07 NOTE — Telephone Encounter (Signed)
[facility] - Client TELEPHONE ADVICE RECORD [facility] Patient Name: Monica [name] Gender: Female DOB: 1966-04-19 Age: 54 Y 16 D Return Phone Number: [phone] (Primary) Address: City/State/Zip: [place] [address] Client [facility] - Client Client Site [place] - Day Physician [name] - MD Contact Type Call Who Is Calling Patient / Member / Family / Caregiver Call Type Triage / Clinical Relationship To Patient Self Return Phone Number [phone] (Primary) Chief Complaint ABDOMINAL PAIN - Severe and only in abdomen Reason for Call Symptomatic / Request for [place] states she is having sharp severe abdominal pains. She states it is a stabbing pain. Translation No Nurse Assessment Nurse: May, RN, Tammy Date/Time Eilene Ghazi Time): 09/07/2019 9:01:12 AM Confirm and document reason for call. If symptomatic, describe symptoms. ---Caller states she is having sharp stabbing right mid-lower abd pain. Caller states it woke her up. Caller states she is nauseated. Stools are loose. No temp Has the patient had close contact with a person known or suspected to have the novel coronavirus illness OR traveled / lives in area with major community spread (including international travel) in the last 14 days from the onset of symptoms? * If Asymptomatic, screen for exposure and travel within the last 14 days. ---No Does the patient have any new or worsening symptoms? ---Yes Will a triage be completed? ---Yes Related visit to physician within the last 2 weeks? ---No Does the PT have any chronic conditions? (i.e. diabetes, asthma, this includes High risk factors for pregnancy, etc.) ---Yes List chronic conditions. ---htn,thyroid Is this a behavioral health or substance abuse call? ---No Guidelines Guideline Title Affirmed Question Affirmed Notes Nurse Date/Time  (Eastern Time) Abdominal Pain - Female Patient sounds very sick or weak to the triager May, RN, Tammy 09/07/2019 9:04:21 AM Disp. Time Eilene Ghazi Time) Disposition Final User 09/07/2019 8:59:53 AM Send to Urgent Queue [place], [place] PLEASE NOTE: All timestamps contained within this report are represented as [place] Standard Time. CONFIDENTIALTY NOTICE: This fax transmission is intended only for the addressee. It contains information that is legally privileged, confidential or otherwise protected from use or disclosure. If you are not the intended recipient, you are strictly prohibited from reviewing, disclosing, copying using or disseminating any of this information or taking any action in reliance on or regarding this information. If you have received this fax in error, please notify us immediately by telephone so that we can arrange for its return to [place]. Phone: [phone], Toll-Free: [phone], Fax: [phone] Page: 2 of 2 Call Id: [id number] 09/07/2019 9:07:17 AM Go to ED Now (or PCP triage) Yes May, RN, Tammy Caller Disagree/Comply Comply Caller Understands Yes PreDisposition Did not know what to do Care Advice Given Per Guideline GO TO ED NOW (OR PCP TRIAGE): ANOTHER ADULT SHOULD DRIVE: * It is better and safer if another adult drives instead of you. BRING MEDICINES: * Please bring a list of your current medicines when you go to the Emergency Department (ER). CARE ADVICE given per Abdominal Pain, Female (Adult) guideline. * IF NO PCP (PRIMARY CARE PROVIDER) SECONDLEVEL TRIAGE: You need to be seen within the next hour. Go to the [facility] at _____________ [place] as soon as you can. Referrals GO TO FACILITY UNDECIDED

## 2019-09-07 NOTE — Telephone Encounter (Signed)
Noted. Will await ER eval.  

## 2019-09-07 NOTE — ED Notes (Signed)
Pt transported to ultrasound.

## 2019-09-07 NOTE — ED Provider Notes (Signed)
[facility] EMERGENCY DEPARTMENT Provider Note   CSN: [id number] Arrival date & time: 09/07/19  8341     History Chief Complaint  Patient presents with  . Abdominal Pain    Monica [name] is a 54 y.o. female.  HPI Patient presents to the emergency department with sudden onset of abdominal pain around 2 AM.  The patient states this awoke her from sleep.  Patient states she did have nausea but no vomiting with this.  Patient states that the pain seems to wax and wane and then around 630 it started hurting fairly severely again.  The patient states that she has had some diarrhea since Monday of this week.  The patient states that her appetite has been decreased.  She states she is not having any abdominal pain until this morning's episode.  Patient states she did not take any medications prior to arrival for her symptoms.  Patient states that she has had her tubes tied but no other abdominal surgeries.  The patient denies chest pain, shortness of breath, headache,blurred vision, neck pain, fever, cough, weakness, numbness, dizziness, anorexia, edema,  vomiting,  rash, back pain, dysuria, hematemesis, bloody stool, near syncope, or syncope.    Past Medical History:  Diagnosis Date  . Essential hypertension   . History of chicken pox   . Hyperbilirubinemia 01/19/2017  . Hyperlipidemia   . Hypothyroidism   . Primary adenocarcinoma of upper lobe of right lung ([place]) 05/28/2016   RUL apex spiculated ~2cm nodule concerning by PET scan - referred to multidisciplinary thoracic oncology clinic S/p thoracoscopic RULobectomy Roxan Hockey) 06/2016  . Sarcoidosis, lung ([place]) ~2004   treated with prendisone and stable since then    Patient Active Problem List   Diagnosis Date Noted  . RUQ abdominal pain 11/17/2018  . Persistent cough 10/02/2018  . 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 HTN (hypertension) 12/30/2016  . Hyperlipidemia 11/19/2016  . Fever blister 11/19/2016  . Chronic  cough 08/17/2016  . ETD (Eustachian tube dysfunction), right 08/17/2016  . Globus sensation 07/09/2016  . History of adenocarcinoma of lung 05/28/2016  . Bilateral carpal tunnel syndrome 05/28/2016  . Primary osteoarthritis of first carpometacarpal joint of right hand 05/28/2016  . Trigger finger of right thumb 05/28/2016  . Health maintenance examination 04/01/2015  . Vitamin D deficiency 04/01/2015  . Obesity, Class II, BMI 35-39.9, no comorbidity 04/01/2015  . History of sarcoidosis 10/10/2014  . Hypothyroidism 10/10/2014    Past Surgical History:  Procedure Laterality Date  . CARDIOVASCULAR STRESS TEST  12/2016   Nuclear CP stress test: hypertensive response to exercise, normal low risk study, EF 68%  . COLONOSCOPY  09/2015   WNL [name])  . ENDOMETRIAL ABLATION W/ NOVASURE  01/2009   Archie Endo 12/02/2010  . LOBECTOMY Right 06/14/2016   RU Lobectomy; Surgeon: [place] Nakayama, MD  . LYMPH NODE DISSECTION Right 06/14/2016   RIGHT UPPER LOBE LYMPH NODE DISSECTION;  Surgeon: [place] Nakayama, MD  . [place] Bilateral 1998  . TUBAL LIGATION  2001  . VIDEO ASSISTED THORACOSCOPY (VATS)/WEDGE RESECTION Right 06/14/2016   Surgeon: Melrose Nakayama, MD     OB History   No obstetric history on file.     Family History  Problem Relation Age of Onset  . Cancer Mother 67       breast with met to bone  . Hypertension Mother   . Cancer Father 52       prostate  . CAD Maternal Grandmother  MI  . Stroke Maternal Grandmother   . Stroke Paternal Grandmother   . CAD Paternal Grandmother   . Diabetes Paternal Grandfather     Social History   Tobacco Use  . Smoking status: Never Smoker  . Smokeless tobacco: Never Used  Substance Use Topics  . Alcohol use: No    Alcohol/week: 0.0 standard drinks  . Drug use: No    Home Medications Prior to Admission medications   Medication Sig Start Date End Date Taking? Authorizing Provider  albuterol (VENTOLIN  HFA) 108 (90 Base) MCG/ACT inhaler Inhale 2 puffs into the lungs every 6 (six) hours as needed for wheezing or shortness of breath. 06/08/19   [name], MD  Cholecalciferol (VITAMIN D-3) 1000 units CAPS Take 1,000 Units by mouth at bedtime.    [provider]  fluticasone (FLONASE) 50 MCG/ACT nasal spray Place 1 spray into both nostrils daily as needed for allergies. 12/31/16   Mikhail, Velta Addison, DO  hydrochlorothiazide (HYDRODIURIL) 25 MG tablet TAKE 1 TABLET(25 MG) BY MOUTH DAILY 06/08/19   [name], MD  ibuprofen (ADVIL,MOTRIN) 200 MG tablet Take 400 mg by mouth every 6 (six) hours as needed for mild pain.     [provider]  levothyroxine (SYNTHROID) 75 MCG tablet Take 1 tablet (75 mcg total) by mouth daily before breakfast. 06/08/19   [name], MD  losartan (COZAAR) 25 MG tablet Take 1 tablet (25 mg total) by mouth daily. 06/08/19   [name], MD  Spacer/Aero-Holding Josiah Lobo DEVI Use as directed with albuterol inhaler 06/08/19   [name], MD  valACYclovir (VALTREX) 1000 MG tablet Take 1,000 mg by mouth 2 (two) times daily as needed (fever blisters).    [provider]    Allergies    Patient has no known allergies.  Review of Systems   Review of Systems All other systems negative except as documented in the HPI. All pertinent positives and negatives as reviewed in the HPI. Physical Exam Updated Vital Signs BP (!) 143/88   Pulse 90   Temp 98.1 F (36.7 C) (Oral)   Resp 18   SpO2 100%   Physical Exam Vitals and nursing note reviewed.  Constitutional:      General: She is not in acute distress.    Appearance: She is well-developed.  HENT:     Head: Normocephalic and atraumatic.  Eyes:     Pupils: Pupils are equal, round, and reactive to light.  Cardiovascular:     Rate and Rhythm: Normal rate and regular rhythm.     Heart sounds: Normal heart sounds. No murmur. No friction rub. No gallop.   Pulmonary:      Effort: Pulmonary effort is normal. No respiratory distress.     Breath sounds: Normal breath sounds. No wheezing.  Abdominal:     General: Bowel sounds are normal. There is no distension.     Palpations: Abdomen is soft.     Tenderness: There is abdominal tenderness in the right lower quadrant. There is no guarding or rebound. Negative signs include Rovsing's sign.  Musculoskeletal:     Cervical back: Normal range of motion and neck supple.  Skin:    General: Skin is warm and dry.     Capillary Refill: Capillary refill takes less than 2 seconds.     Findings: No erythema or rash.  Neurological:     Mental Status: She is alert and oriented to person, place, and time.     Motor: No abnormal muscle tone.  Coordination: Coordination normal.  Psychiatric:        Behavior: Behavior normal.     ED Results / Procedures / Treatments   Labs (all labs ordered are listed, but only abnormal results are displayed) Labs Reviewed  LIPASE, BLOOD  COMPREHENSIVE METABOLIC PANEL  CBC  URINALYSIS, ROUTINE W REFLEX MICROSCOPIC    EKG None  Radiology No results found.  Procedures Procedures (including critical care time)  Medications Ordered in ED Medications  sodium chloride flush (NS) 0.9 % injection 3 mL (has no administration in time range)    ED Course  I have reviewed the triage vital signs and the nursing notes.  Pertinent labs & imaging results that were available during my care of the patient were reviewed by me and considered in my medical decision making (see chart for details).    MDM Rules/Calculators/A&P                     I explained to the patient that with the acute onset of her pain it seems that this could represent a kidney stone but other etiologies could be possible as well.  I advised her that we will get laboratory testing along with a CT scan to further evaluate her pain.  Patient has stable vital signs at this time.  I have advised her of the plan and all  questions are answered up into this point.   On reassessment of the patient the patient continues to have right lower abdominal pain but no worsening symptoms while here in the emergency department.  I did discuss the imaging findings with the patient and she has no upper abdominal pain that would lead [place] to believe that she has cholecystitis.  I did advise her that she could have an early appendicitis that yet to fully declare itself on CT scan.  I did advise her if she is continuing to have pain we would like to reassess her in the morning.  I advised her that if her condition worsens between now and tomorrow morning she needs to return here for further evaluation.  I did advise her that this reassessment may be necessary to further delineate the cause of her abdominal pain as that may be an evolving process that she had to fully declare itself.  The patient agrees with this plan and all questions were answered. Final Clinical Impression(s) / ED Diagnoses Final diagnoses:  None    Rx / DC Orders ED Discharge Orders    None       Dalia Heading, PA-C 09/07/19 1604    Pattricia Boss, MD 09/17/19 1455

## 2019-09-07 NOTE — Discharge Instructions (Signed)
Return here in the morning for reevaluation if you continue to have pain if your symptoms change before tomorrow morning you need to return here immediately for further evaluation.  This could be an evolving process that is yet to fully declare itself and that is why we would like for you to be reassessed tomorrow.

## 2019-09-07 NOTE — Telephone Encounter (Signed)
Per chart review tab pt is at [facility]. 

## 2019-09-10 NOTE — Telephone Encounter (Signed)
Lvm asking pt to call back.  Need an update on sxs.  

## 2019-09-10 NOTE — Telephone Encounter (Signed)
Sent home with normal CT scan.  plz call today for update on symptoms.

## 2019-09-11 NOTE — Telephone Encounter (Signed)
Lvm asking pt to call back.  Need an update on sxs.  

## 2019-09-12 NOTE — Telephone Encounter (Signed)
Lvm asking pt to call back.  Need an update on sxs.  

## 2019-09-12 NOTE — Telephone Encounter (Signed)
HIDA ordered. Recommend increase potassium rich foods (fruits/vegetables) and/or start OTC potassium supplement (ie 99mg  daily) and recheck levels in 1 wk. If persistently low, will need to start Rx potassium supplement daily.

## 2019-09-12 NOTE — Telephone Encounter (Addendum)
Would offer HIDA scan again or referral to gen surgery for further evaluation of ongoing RUQ abd pain.  Her potassium was low - was she given potassium replacement in the ER?

## 2019-09-12 NOTE — Telephone Encounter (Signed)
Pt stated pain seems like it was before when she saw dr g.  Its not constant .  Last  night she had chick fla nuggets and afterward she started having stomach pain  /diarrhea on right side.   Best number [phone] She goes to lunch from 1-2

## 2019-09-12 NOTE — Telephone Encounter (Addendum)
Spoke with pt relaying Dr. Synthia Innocent message.  Pt verbalizes understanding and agrees to HIDA scan.  Prefers a Friday appt. Mondays are 2nd choice.  States she is not sure if she was given potassium.

## 2019-09-12 NOTE — Addendum Note (Signed)
Addended by: [name] on: 09/12/2019 05:23 PM   Modules accepted: Orders

## 2019-09-13 NOTE — Telephone Encounter (Signed)
Spoke with pt relaying Dr. Synthia Innocent message.  Pt verbalizes understanding and scheduled lab visit on 09/21/19 at 8:25.

## 2019-09-19 ENCOUNTER — Telehealth: Payer: Self-pay

## 2019-09-19 NOTE — Telephone Encounter (Signed)
LVM to call clinic, need to R/S lab appt 2.17.2021 TLJ

## 2019-09-20 ENCOUNTER — Other Ambulatory Visit: Payer: Self-pay

## 2019-09-20 ENCOUNTER — Ambulatory Visit (HOSPITAL_COMMUNITY)
Admission: RE | Admit: 2019-09-20 | Discharge: 2019-09-20 | Disposition: A | Discharge status: Home / Self Care | Payer: BC Managed Care – PPO | Source: Ambulatory Visit | Attending: Family Medicine | Admitting: Family Medicine

## 2019-09-20 DIAGNOSIS — R1011 Right upper quadrant pain: Secondary | ICD-10-CM | POA: Insufficient documentation

## 2019-09-20 DIAGNOSIS — R1010 Upper abdominal pain, unspecified: Secondary | ICD-10-CM | POA: Diagnosis not present

## 2019-09-20 MED ORDER — TECHNETIUM TC 99M MEBROFENIN IV KIT
5.4000 | PACK | Freq: Once | INTRAVENOUS | Status: AC | PRN
  Administered 2019-09-20: 5.4 via INTRAVENOUS

## 2019-09-21 ENCOUNTER — Other Ambulatory Visit: Payer: Self-pay | Admitting: Family Medicine

## 2019-09-21 ENCOUNTER — Other Ambulatory Visit: Payer: BC Managed Care – PPO

## 2019-09-21 ENCOUNTER — Encounter: Payer: Self-pay | Admitting: Nurse Practitioner

## 2019-09-21 ENCOUNTER — Other Ambulatory Visit (INDEPENDENT_AMBULATORY_CARE_PROVIDER_SITE_OTHER): Payer: BC Managed Care – PPO

## 2019-09-21 DIAGNOSIS — E876 Hypokalemia: Secondary | ICD-10-CM | POA: Diagnosis not present

## 2019-09-21 DIAGNOSIS — R1011 Right upper quadrant pain: Secondary | ICD-10-CM | POA: Diagnosis not present

## 2019-09-21 LAB — POTASSIUM: Potassium: 3.8 mEq/L (ref 3.5–5.1)

## 2019-09-22 LAB — BILIRUBIN, FRACTIONATED(TOT/DIR/INDIR)
Bilirubin, Direct: 0.3 mg/dL — ABNORMAL HIGH (ref 0.0–0.2)
Indirect Bilirubin: 1.4 mg/dL (calc) — ABNORMAL HIGH (ref 0.2–1.2)
Total Bilirubin: 1.7 mg/dL — ABNORMAL HIGH (ref 0.2–1.2)

## 2019-09-28 ENCOUNTER — Other Ambulatory Visit: Payer: Self-pay

## 2019-09-28 ENCOUNTER — Ambulatory Visit: Payer: BC Managed Care – PPO | Admitting: Nurse Practitioner

## 2019-09-28 ENCOUNTER — Encounter: Payer: Self-pay | Admitting: Nurse Practitioner

## 2019-09-28 VITALS — BP 114/84 | HR 88 | Temp 98.1°F | Ht 61.75 in | Wt 198.0 lb

## 2019-09-28 DIAGNOSIS — R1011 Right upper quadrant pain: Secondary | ICD-10-CM | POA: Diagnosis not present

## 2019-09-28 DIAGNOSIS — R197 Diarrhea, unspecified: Secondary | ICD-10-CM

## 2019-09-28 MED ORDER — DICYCLOMINE HCL 10 MG PO CAPS: 10.0000 mg | ORAL_CAPSULE | Freq: Two times a day (BID) | ORAL | 0 refills | Status: DC | PRN

## 2019-09-28 NOTE — Patient Instructions (Addendum)
If you are age 55 or older, your body mass index should be between 23-30. Your Body mass index is 36.51 kg/m. If this is out of the aforementioned range listed, please consider follow up with your Primary Care Provider.  If you are age 17 or younger, your body mass index should be between 19-25. Your Body mass index is 36.51 kg/m. If this is out of the aformentioned range listed, please consider follow up with your Primary Care Provider.   We have sent the following medications to your pharmacy for you to pick up at your convenience: Bentyl 10 mg  Start Phillips Bacteria Probiotic  -  One a day for 2-4 weeks.  This is over-the-counter.  We are requesting a copy of colonoscopy from [facility].  Call if symptoms recur.  Thank you for choosing me and [facility].   Carl Best, NP

## 2019-09-28 NOTE — Progress Notes (Addendum)
09/28/2019 ESSA WENK [id number] 1965-11-29   CHIEF COMPLAINT: RUQ pain and diarrhea   HISTORY OF PRESENT ILLNESS:  Hinton Dyer B. Stankiewicz is a 54 year old female with a past medical history significant for hypertension, hypothyroidism, pulmonary sarcoidosis in remission x 16 years and adenocarcinoma to the upper lung s/p right upper lobectomy with lymph node dissection 06/2016.  Past tubal ligation. She was referred to our office by Dr. Danise Mina for further evaluation regarding intermittent RUQ abdominal pain which initially started one year ago and for new onset diarrhea. At the end of Jan. 2021, she ate a late lunch so she skipped dinner. Later that evening she ate several handfuls of pecans and cashews. She awakened in the middle of the night with watery diarrhea. The next day she had another bout of loose diarrhea. No bloody diarrhea. A few days later, she was awakened at 2am with  sharp RUQ abdominal pain which radiated down toward the RLQ  which felt like she was being stabbed with a knife which lasted for 1 hours. She presented to [facility] 09/07/2019 for further evaluation. Labs showed elevated T. Bili with normal AST/ALT and alk phos: Na 137. K 3.2. Glu 119. BUN 9. Cr. 0.74. Alk phos 75. AST 20. ALT 18. T. Bili 2.3. WBC 10.1. Hg 13.9. HCT 41.8. PLT 362. UA negative. A renal protocol CT showed a small fat containing umbilical hernia otherwise was normal. A pelvic sonogram was normal. She was discharged home. Dr. Danise Mina contacted the patient and a [facility] HIDA scan was ordered and potassium OTC was prescribed. CCK HIDA  2/18 was normal. Repeat K+ 3.8. She continued to have diarrhea for a few days then no BM for 3 to 4 days. She then passed mud like stools once daily for the next 2 1/2 weeks. She felt constipated yesterday. Today she passe one normal formed brown stool. No rectal bleeding or melena. No mucous per there rectum. No recent antibiotics. Her RUQ pain has nearly resolved at  this time. She has slight nausea. No dysphagia or  heartburn.  No fever, sweats or chills. No weight loss.  Infrequent NSAID use. No family history of IBD or colon cancer. She reported having a normal colonoscopy done by [facility] 5 years ago.   She received her 1st Covid vaccination 2/23.   Renal stone CT w/o contrast 09/07/2019:  Hepatobiliary: No focal liver abnormality is seen. No gallstones, gallbladder wall thickening, or biliary dilatation. Pancreas: Unremarkable. No pancreatic ductal dilatation or surrounding inflammatory changes. Spleen: Normal in size without focal abnormality. Stomach/Bowel: Stomach is within normal limits. Appendix appears normal. No evidence of bowel wall thickening, distention, or inflammatory changes Kidneys are normal, without renal calculi, focal lesion, or hydronephrosis. Bladder is unremarkable. Small fat containing umbilical hernia. No abdominopelvic Ascites.  Pelvic sonogram 09/07/2019:  Left ovary not visualized.  No evidence of right ovarian torsion. Endometrium not well evaluated.  [facility] HIDA 09/20/2019:  Liver uptake of radiotracer is unremarkable. There is prompt visualization of gallbladder and small bowel, indicating patency of the cystic and common bile ducts. The patient consumed 8 ounces of Ensure orally with calculation of the computer generated ejection fraction of radiotracer from the gallbladder. The patient did not experience clinical symptoms with the oral Ensure consumption. The computer generated ejection fraction of radiotracer from the gallbladder is normal at 68%, normal greater than 33% using the oral Agent  RUQ sonogram 11/22/2018:  No gallstones or wall thickening visualized. There is no  pericholecystic fluid. No sonographic Murphy sign noted by sonographer. Common bile duct: Diameter: 3 mm. No intrahepatic or extrahepatic biliary duct dilatation. Liver: No focal lesion identified. Liver echogenicity overall  is Increased, finding indicative of hepatic steatosis. Portal vein is patent on color Doppler imaging with normal direction of blood flow towards the liver.   Past Medical History:  Diagnosis Date  . Essential hypertension   . History of chicken pox   . Hyperbilirubinemia 01/19/2017  . Hyperlipidemia   . Hypothyroidism   . Primary adenocarcinoma of upper lobe of right lung ([place]) 05/28/2016   RUL apex spiculated ~2cm nodule concerning by PET scan - referred to multidisciplinary thoracic oncology clinic S/p thoracoscopic RULobectomy Roxan Hockey) 06/2016  . Sarcoidosis, lung ([place]) ~2004   treated with prendisone and stable since then   Past Surgical History:  Procedure Laterality Date  . CARDIOVASCULAR STRESS TEST  12/2016   Nuclear CP stress test: hypertensive response to exercise, normal low risk study, EF 68%  . COLONOSCOPY  09/2015   WNL [name])  . ENDOMETRIAL ABLATION W/ NOVASURE  01/2009   Archie Endo 12/02/2010  . LOBECTOMY Right 06/14/2016   RU Lobectomy; Surgeon: Melrose Nakayama, MD  . LYMPH NODE DISSECTION Right 06/14/2016   RIGHT UPPER LOBE LYMPH NODE DISSECTION;  Surgeon: Melrose Nakayama, MD  . Murphy Bilateral 1998  . TUBAL LIGATION  2001  . VIDEO ASSISTED THORACOSCOPY (VATS)/WEDGE RESECTION Right 06/14/2016   Surgeon: Melrose Nakayama, MD    Family History: Mother died at the age of 92 from breast cancer and HTN. Father with prostate cancer and leukemia. MGM and PGM with CAD.   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 Nonsmoker. No alcohol or drug use.     Outpatient Encounter Medications as of 09/28/2019  Medication Sig  . albuterol (VENTOLIN HFA) 108 (90 Base) MCG/ACT inhaler Inhale 2 puffs into the lungs every 6 (six) hours as needed for wheezing or shortness of breath.  . Cholecalciferol (VITAMIN D-3) 1000 units CAPS Take 1,000 Units by mouth at bedtime.  . fluticasone (FLONASE) 50 MCG/ACT nasal spray Place 1 spray into both  nostrils daily as needed for allergies.  . hydrochlorothiazide (HYDRODIURIL) 25 MG tablet TAKE 1 TABLET(25 MG) BY MOUTH DAILY  . ibuprofen (ADVIL,MOTRIN) 200 MG tablet Take 400 mg by mouth every 6 (six) hours as needed for mild pain.   Marland Kitchen levothyroxine (SYNTHROID) 75 MCG tablet Take 1 tablet (75 mcg total) by mouth daily before breakfast.  . losartan (COZAAR) 25 MG tablet Take 1 tablet (25 mg total) by mouth daily.  . Potassium 99 MG TABS Take by mouth daily.  Marland Kitchen Spacer/Aero-Holding Dorise Bullion Use as directed with albuterol inhaler  . valACYclovir (VALTREX) 1000 MG tablet Take 1,000 mg by mouth 2 (two) times daily as needed (fever blisters).   No facility-administered encounter medications on file as of 09/28/2019.   No Known Allergies   REVIEW OF SYSTEMS: All other systems reviewed and negative except where noted in the History of Present Illness.  PHYSICAL EXAM: BP 114/84   Pulse 88   Temp 98.1 F (36.7 C)   Ht 5' 1.75" (1.568 m)   Wt 198 lb (89.8 kg)   BMI 36.51 kg/m  General: Well developed 54 year old female in no acute distress. Head: Normocephalic and atraumatic. Eyes:  Sclerae non-icteric, conjunctive pink. Ears: Normal auditory acuity. Mouth: Dentition intact. No ulcers or lesions.  Neck: Supple, no lymphadenopathy or thyromegaly.  Lungs: Clear bilaterally to auscultation  without wheezes, crackles or rhonchi. Heart: Regular rate and rhythm. No murmur, rub or gallop appreciated.  Abdomen: Soft, nontender, non distended. No masses. No hepatosplenomegaly. Normoactive bowel sounds x 4 quadrants.  Rectal: Deferred.  Musculoskeletal: Symmetrical with no gross deformities. Skin: Warm and dry. No rash or lesions on visible extremities. Extremities: No edema. Neurological: Alert oriented x 4, no focal deficits.  Psychological:  Alert and cooperative. Normal mood and affect.  ASSESSMENT AND PLAN:  54. 54 year old female with diarrhea, resolved. 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 -Phillip's bacteria probiotic once daily  2. Right mid abdominal pain 5 to 6 cm right of the umbilicus at the horizontal midline is area of recurrent pain. No current pain at this time. Possible adhesions from past tubal ligation surgery.  -If her right mid abdominal pain recurs patient was instructed to call our office and I will orders labs and an abd/pelvic CT with contrast to be done stat during time of active pain. -Request a copy of her colonoscopy from [facility] done 5 years ago -I will consult with Dr. Tarri Glenn to consider scheduling an EGD and colonoscopy as the etiology of her right mid abdominal pain is unclear. -She will call our office if her diarrhea recurs.  -Dicyclomine '10mg'$  po bid PRN -Phillip's bacteria probiotic once daily  3. History of non-small cell lung cancer, adenocarcinoma s/p right upper lobectomy with lymph node dissection  06/2016  4. Remote history of pulmonary sarcoidosis   5. Elevated T. Bilirubin level with normal AST/ALT and alk phos consistent with history of Gilbert's   Colonoscopy records received from [facility]:  Colonoscopy by Dr. [name] 09/26/2015: The distal rectum and anal verge are normal on retroflexion. No specimens collected. Normal screening colonoscopy.  Bowel prep was god. 10 year recall.   CC:  [name], MD

## 2019-10-01 NOTE — Progress Notes (Signed)
Reviewed and agree with management plans including proceeding with endoscopic evaluation of her abdominal pain.  Jessenia Filippone L. Tarri Glenn, MD, MPH

## 2019-10-02 ENCOUNTER — Telehealth: Payer: Self-pay | Admitting: Nurse Practitioner

## 2019-10-02 ENCOUNTER — Encounter: Payer: Self-pay | Admitting: Gastroenterology

## 2019-10-02 NOTE — Telephone Encounter (Signed)
Bre, I called patient and left her a message on her voicemail that an EGD and colonoscopy are recommended due to her unexplained right mid abd pain. Please call the patient and schedule an EGD and colonoscopy at [facility] with Dr. Tarri Glenn. I am happy to speak with the patient if she would like to discuss this further, if so it would be best for her to provide me with a time that might be best to contact her. Thx.

## 2019-10-02 NOTE — Telephone Encounter (Signed)
Left message for patient call back to the office to be scheduled for an EGD/colon with Dr. Tarri Glenn at [facility];

## 2019-10-03 ENCOUNTER — Telehealth: Payer: Self-pay | Admitting: General Surgery

## 2019-10-03 NOTE — Telephone Encounter (Signed)
Medical records received from [facility] gastro as requested. Gave to Chandler, NP for review.

## 2019-10-05 NOTE — Telephone Encounter (Signed)
Left message with patient's husband-James-for the patient to call back to the office;

## 2019-10-05 NOTE — Telephone Encounter (Signed)
Patient called back to the office-patient reports she has already been scheduled for her previsit on 11/16/2019 at 1:00 pm; patient has been scheduled for EGD/colon on 04/23/202 1at 2:30 pm with Dr. Tarri Glenn; patient also reports she has had 2 COVID vaccinations; Patient advised to call back to the office at [phone] should questions/concerns arise;  Patient verbalized understanding of information/instructions;

## 2019-10-19 ENCOUNTER — Ambulatory Visit: Payer: BC Managed Care – PPO | Admitting: Gastroenterology

## 2019-11-01 HISTORY — PX: COLONOSCOPY: SHX174

## 2019-11-01 HISTORY — PX: ESOPHAGOGASTRODUODENOSCOPY: SHX1529

## 2019-11-07 DIAGNOSIS — Z6835 Body mass index (BMI) 35.0-35.9, adult: Secondary | ICD-10-CM | POA: Diagnosis not present

## 2019-11-07 DIAGNOSIS — Z9189 Other specified personal risk factors, not elsewhere classified: Secondary | ICD-10-CM | POA: Diagnosis not present

## 2019-11-07 DIAGNOSIS — Z01419 Encounter for gynecological examination (general) (routine) without abnormal findings: Secondary | ICD-10-CM | POA: Diagnosis not present

## 2019-11-07 DIAGNOSIS — Z1231 Encounter for screening mammogram for malignant neoplasm of breast: Secondary | ICD-10-CM | POA: Diagnosis not present

## 2019-11-08 ENCOUNTER — Other Ambulatory Visit: Payer: Self-pay | Admitting: Obstetrics and Gynecology

## 2019-11-08 DIAGNOSIS — Z9189 Other specified personal risk factors, not elsewhere classified: Secondary | ICD-10-CM

## 2019-11-16 ENCOUNTER — Other Ambulatory Visit: Payer: Self-pay

## 2019-11-16 ENCOUNTER — Ambulatory Visit (AMBULATORY_SURGERY_CENTER): Payer: Self-pay | Admitting: *Deleted

## 2019-11-16 VITALS — Temp 96.9°F | Ht 61.75 in | Wt 188.0 lb

## 2019-11-16 DIAGNOSIS — R1011 Right upper quadrant pain: Secondary | ICD-10-CM

## 2019-11-16 MED ORDER — SUTAB 1479-225-188 MG PO TABS: 24.0000 | ORAL_TABLET | ORAL | 0 refills | Status: DC

## 2019-11-16 NOTE — Progress Notes (Signed)
2-23 completed covid vaccines   No egg or soy allergy known to patient  No issues with past sedation with any surgeries  or procedures, no intubation problems  No diet pills per patient No home 02 use per patient  No blood thinners per patient  Pt denies issues with constipation  No A fib or A flutter  EMMI video sent to pt's e mail   Due to the COVID-19 pandemic we are asking patients to follow these guidelines. Please only bring one care partner. Please be aware that your care partner may wait in the car in the parking lot or if they feel like they will be too hot to wait in the car, they may wait in the lobby on the 4th floor. All care partners are required to wear a mask the entire time (we do not have any that we can provide them), they need to practice social distancing, and we will do a Covid check for all patient's and care partners when you arrive. Also we will check their temperature and your temperature. If the care partner waits in their car they need to stay in the parking lot the entire time and we will call them on their cell phone when the patient is ready for discharge so they can bring the car to the front of the building. Also all patient's will need to wear a mask into building.  Sutab Coupon and COde

## 2019-11-22 ENCOUNTER — Encounter: Payer: Self-pay | Admitting: Gastroenterology

## 2019-11-22 ENCOUNTER — Telehealth: Payer: Self-pay | Admitting: Gastroenterology

## 2019-11-22 DIAGNOSIS — R1011 Right upper quadrant pain: Secondary | ICD-10-CM

## 2019-11-22 MED ORDER — SUTAB 1479-225-188 MG PO TABS: 1.0000 | ORAL_TABLET | Freq: Once | ORAL | 0 refills | Status: AC

## 2019-11-22 NOTE — Telephone Encounter (Signed)
Pt reported that [facility] does not have Sutab and requested rx to be sent to [facility] on [address].

## 2019-11-22 NOTE — Telephone Encounter (Signed)
[facility],called pt. And message left that rx sent in.

## 2019-11-23 ENCOUNTER — Ambulatory Visit (AMBULATORY_SURGERY_CENTER): Payer: BC Managed Care – PPO | Admitting: Gastroenterology

## 2019-11-23 ENCOUNTER — Other Ambulatory Visit: Payer: Self-pay

## 2019-11-23 ENCOUNTER — Encounter: Payer: BC Managed Care – PPO | Admitting: Gastroenterology

## 2019-11-23 ENCOUNTER — Encounter: Payer: Self-pay | Admitting: Gastroenterology

## 2019-11-23 VITALS — BP 75/60 | HR 74 | Temp 97.1°F | Resp 12 | Ht 61.0 in | Wt 188.0 lb

## 2019-11-23 DIAGNOSIS — K635 Polyp of colon: Secondary | ICD-10-CM

## 2019-11-23 DIAGNOSIS — K259 Gastric ulcer, unspecified as acute or chronic, without hemorrhage or perforation: Secondary | ICD-10-CM | POA: Diagnosis not present

## 2019-11-23 DIAGNOSIS — R1011 Right upper quadrant pain: Secondary | ICD-10-CM | POA: Diagnosis not present

## 2019-11-23 DIAGNOSIS — K317 Polyp of stomach and duodenum: Secondary | ICD-10-CM | POA: Diagnosis not present

## 2019-11-23 DIAGNOSIS — D122 Benign neoplasm of ascending colon: Secondary | ICD-10-CM | POA: Diagnosis not present

## 2019-11-23 DIAGNOSIS — K2100 Gastro-esophageal reflux disease with esophagitis, without bleeding: Secondary | ICD-10-CM | POA: Diagnosis not present

## 2019-11-23 DIAGNOSIS — R197 Diarrhea, unspecified: Secondary | ICD-10-CM | POA: Diagnosis not present

## 2019-11-23 DIAGNOSIS — K449 Diaphragmatic hernia without obstruction or gangrene: Secondary | ICD-10-CM | POA: Diagnosis not present

## 2019-11-23 DIAGNOSIS — K3189 Other diseases of stomach and duodenum: Secondary | ICD-10-CM

## 2019-11-23 DIAGNOSIS — K5730 Diverticulosis of large intestine without perforation or abscess without bleeding: Secondary | ICD-10-CM | POA: Diagnosis not present

## 2019-11-23 DIAGNOSIS — K219 Gastro-esophageal reflux disease without esophagitis: Secondary | ICD-10-CM | POA: Diagnosis not present

## 2019-11-23 MED ORDER — PANTOPRAZOLE SODIUM 40 MG PO TBEC: 40.0000 mg | DELAYED_RELEASE_TABLET | Freq: Two times a day (BID) | ORAL | 3 refills | Status: DC

## 2019-11-23 MED ORDER — SODIUM CHLORIDE 0.9 % IV SOLN: 500.0000 mL | Freq: Once | INTRAVENOUS | Status: DC

## 2019-11-23 NOTE — Op Note (Signed)
[place] Patient Name: Monica [name] Procedure Date: 11/23/2019 2:12 PM MRN: [id number] Endoscopist: Thornton Park MD, MD Age: 54 Referring MD:  Date of Birth: 1966/04/27 Gender: Female Account #: [id number] Procedure:                Upper GI endoscopy Indications:              Abdominal pain in the right upper quadrant Medicines:                Monitored Anesthesia Care Procedure:                Pre-Anesthesia Assessment:                           - Prior to the procedure, a History and Physical                            was performed, and patient medications and                            allergies were reviewed. The patient's tolerance of                            previous anesthesia was also reviewed. The risks                            and benefits of the procedure and the sedation                            options and risks were discussed with the patient.                            All questions were answered, and informed consent                            was obtained. Prior Anticoagulants: The patient has                            taken no previous anticoagulant or antiplatelet                            agents. ASA Grade Assessment: II - A patient with                            mild systemic disease. After reviewing the risks                            and benefits, the patient was deemed in                            satisfactory condition to undergo the procedure.                           After obtaining informed consent, the endoscope was  passed under direct vision. Throughout the                            procedure, the patient's blood pressure, pulse, and                            oxygen saturations were monitored continuously. The                            Endoscope was introduced through the mouth, and                            advanced to the third part of duodenum. The upper                            GI  endoscopy was accomplished without difficulty.                            The patient tolerated the procedure well. Scope In: Scope Out: Findings:                 LA Grade A (one or more mucosal breaks less than 5                            mm, not extending between tops of 2 mucosal folds)                            esophagitis with no bleeding was found. Biopsies                            were taken with a cold forceps for histology.                            Estimated blood loss was minimal.                           One non-bleeding cratered gastric ulcer with                            pigmented material was found in the gastric antrum.                            The lesion was 3 mm in largest dimension. Biopsies                            were taken with a cold forceps for histology.                            Estimated blood loss was minimal.                           Diffuse mild inflammation characterized by  erythema, friability and granularity was found in                            the gastric body and in the gastric antrum.                            Biopsies were taken with a cold forceps for                            histology. Estimated blood loss was minimal.                           A medium-sized Hill Grade IV hiatal hernia was                            present.                           A few small sessile polyps with no stigmata of                            recent bleeding were found in the gastric fundus.                            Biopsies were taken with a cold forceps for                            histology. Estimated blood loss was minimal.                           Diffuse mildly erythematous mucosa without active                            bleeding and with no stigmata of bleeding was found                            in the duodenal bulb. Biopsies were taken with a                            cold forceps for histology. Estimated  blood loss                            was minimal. Complications:            No immediate complications. Estimated blood loss:                            Minimal. Estimated Blood Loss:     Estimated blood loss was minimal. Estimated blood                            loss was minimal. Impression:               - LA Grade A reflux esophagitis with no bleeding.  Biopsied.                           - Non-bleeding gastric ulcer with pigmented                            material. Biopsied.                           - Gastritis. Biopsied.                           - Medium-sized hiatal hernia.                           - Normal examined duodenum.                           - The examination was otherwise normal. Recommendation:           - Patient has a contact number available for                            emergencies. The signs and symptoms of potential                            delayed complications were discussed with the                            patient. Return to normal activities tomorrow.                            Written discharge instructions were provided to the                            patient.                           - Resume previous diet.                           - Continue present medications.                           - Await pathology results.                           - Pantoprazole 40 mg BID x 8 weeks.                           - No aspirin, ibuprofen, naproxen, or other                            non-steroidal anti-inflammatory drugs. Thornton Park MD, MD 11/23/2019 2:55:37 PM This report has been signed electronically.

## 2019-11-23 NOTE — Progress Notes (Signed)
Called to room to assist during endoscopic procedure.  Patient ID and intended procedure confirmed with present staff. Received instructions for my participation in the procedure from the performing physician.  

## 2019-11-23 NOTE — Progress Notes (Signed)
Temp check by:JB Vital check by:DT  The patient states no changes in medical or surgical history since pre-visit screening on 11/16/19.

## 2019-11-23 NOTE — Op Note (Signed)
[place] Patient Name: Monica [name] Procedure Date: 11/23/2019 2:12 PM MRN: [id number] Endoscopist: Thornton Park MD, MD Age: 54 Referring MD:  Date of Birth: 15-Aug-1965 Gender: Female Account #: [id number] Procedure:                Colonoscopy Indications:              Abdominal pain in the right upper quadrant, Recent                            diarrhea                           No known family history of colon cancer or polyps Medicines:                Monitored Anesthesia Care Procedure:                Pre-Anesthesia Assessment:                           - Prior to the procedure, a History and Physical                            was performed, and patient medications and                            allergies were reviewed. The patient's tolerance of                            previous anesthesia was also reviewed. The risks                            and benefits of the procedure and the sedation                            options and risks were discussed with the patient.                            All questions were answered, and informed consent                            was obtained. Prior Anticoagulants: The patient has                            taken no previous anticoagulant or antiplatelet                            agents. ASA Grade Assessment: II - A patient with                            mild systemic disease. After reviewing the risks                            and benefits, the patient was deemed in  satisfactory condition to undergo the procedure.                           After obtaining informed consent, the colonoscope                            was passed under direct vision. Throughout the                            procedure, the patient's blood pressure, pulse, and                            oxygen saturations were monitored continuously. The                            Colonoscope was introduced through the anus  and                            advanced to the 3 cm into the ileum. A second                            forward view of the right colon was performed. The                            colonoscopy was performed without difficulty. The                            patient tolerated the procedure well. The quality                            of the bowel preparation was good. The terminal                            ileum, ileocecal valve, appendiceal orifice, and                            rectum were photographed. Scope In: 2:29:09 PM Scope Out: 2:46:09 PM Scope Withdrawal Time: 0 hours 13 minutes 51 seconds  Total Procedure Duration: 0 hours 17 minutes 0 seconds  Findings:                 Hemorrhoids were found on perianal exam.                           Non-bleeding internal hemorrhoids were found. The                            hemorrhoids were small.                           A few small-mouthed diverticula were found in the                            sigmoid colon, descending colon and ascending colon.  A 2 mm polyp was found in the ascending colon. The                            polyp was flat. The polyp was removed with a cold                            snare. Resection and retrieval were complete.                            Estimated blood loss was minimal.                           The exam was otherwise without abnormality on                            direct and retroflexion views. Complications:            No immediate complications. Estimated blood loss:                            Minimal. Estimated Blood Loss:     Estimated blood loss was minimal. Impression:               - Hemorrhoids found on perianal exam.                           - Non-bleeding internal hemorrhoids.                           - Diverticulosis in the sigmoid colon, in the                            descending colon and in the ascending colon.                           - One 2 mm polyp  in the ascending colon, removed                            with a cold snare. Resected and retrieved.                           - The examination was otherwise normal on direct                            and retroflexion views. Recommendation:           - Patient has a contact number available for                            emergencies. The signs and symptoms of potential                            delayed complications were discussed with the  patient. Return to normal activities tomorrow.                            Written discharge instructions were provided to the                            patient.                           - Resume previous diet.                           - Continue present medications.                           - Await pathology results.                           - Repeat colonoscopy date to be determined after                            pending pathology results are reviewed for                            surveillance.                           - Emerging evidence supports eating a diet of                            fruits, vegetables, grains, calcium, and yogurt                            while reducing red meat and alcohol may reduce the                            risk of colon cancer. Thornton Park MD, MD 11/23/2019 3:00:21 PM This report has been signed electronically.

## 2019-11-23 NOTE — Patient Instructions (Signed)
YOU HAD AN ENDOSCOPIC PROCEDURE TODAY AT THE [facility]:   Refer to the procedure report that was given to you for any specific questions about what was found during the examination.  If the procedure report does not answer your questions, please call your gastroenterologist to clarify.  If you requested that your care partner not be given the details of your procedure findings, then the procedure report has been included in a sealed envelope for you to review at your convenience later.  YOU SHOULD EXPECT: Some feelings of bloating in the abdomen. Passage of more gas than usual.  Walking can help get rid of the air that was put into your GI tract during the procedure and reduce the bloating. If you had a lower endoscopy (such as a colonoscopy or flexible sigmoidoscopy) you may notice spotting of blood in your stool or on the toilet paper. If you underwent a bowel prep for your procedure, you may not have a normal bowel movement for a few days.  Please Note:  You might notice some irritation and congestion in your nose or some drainage.  This is from the oxygen used during your procedure.  There is no need for concern and it should clear up in a day or so.  SYMPTOMS TO REPORT IMMEDIATELY:   Following lower endoscopy (colonoscopy or flexible sigmoidoscopy):  Excessive amounts of blood in the stool  Significant tenderness or worsening of abdominal pains  Swelling of the abdomen that is new, acute  Fever of 100F or higher   Following upper endoscopy (EGD)  Vomiting of blood or coffee ground material  New chest pain or pain under the shoulder blades  Painful or persistently difficult swallowing  New shortness of breath  Fever of 100F or higher  Black, tarry-looking stools  For urgent or emergent issues, a gastroenterologist can be reached at any hour by calling [phone]. Do not use MyChart messaging for urgent concerns.    DIET:  We do recommend a small meal at first, but  then you may proceed to your regular diet.  Drink plenty of fluids but you should avoid alcoholic beverages for 24 hours.  ACTIVITY:  You should plan to take it easy for the rest of today and you should NOT DRIVE or use heavy machinery until tomorrow (because of the sedation medicines used during the test).    FOLLOW UP: Our staff will call the number listed on your records 48-72 hours following your procedure to check on you and address any questions or concerns that you may have regarding the information given to you following your procedure. If we do not reach you, we will leave a message.  We will attempt to reach you two times.  During this call, we will ask if you have developed any symptoms of COVID 19. If you develop any symptoms (ie: fever, flu-like symptoms, shortness of breath, cough etc.) before then, please call [phone].  If you test positive for Covid 19 in the 2 weeks post procedure, please call and report this information to us.    If any biopsies were taken you will be contacted by phone or by letter within the next 1-3 weeks.  Please call us at [phone] if you have not heard about the biopsies in 3 weeks.    SIGNATURES/CONFIDENTIALITY: You and/or your care partner have signed paperwork which will be entered into your electronic medical record.  These signatures attest to the fact that that the information above on   your After Visit Summary has been reviewed and is understood.  Full responsibility of the confidentiality of this discharge information lies with you and/or your care-partner. 

## 2019-11-23 NOTE — Progress Notes (Signed)
Report to PACU, RN, vss, BBS= Clear.  

## 2019-11-26 ENCOUNTER — Encounter: Payer: BC Managed Care – PPO | Admitting: Gastroenterology

## 2019-11-27 ENCOUNTER — Telehealth: Payer: Self-pay | Admitting: *Deleted

## 2019-11-27 NOTE — Telephone Encounter (Signed)
  Follow up Call-  Call back number 11/23/2019  Post procedure Call Back phone  # [phone]  Permission to leave phone message Yes  Some recent data might be hidden     Patient questions:  Do you have a fever, pain , or abdominal swelling? Yes.   Pain Score  5 *  Have you tolerated food without any problems? yes  Have you been able to return to your normal activities? Yes   Do you have any questions about your discharge instructions: Diet   No. Medications  No. Follow up visit  No.  Do you have questions or concerns about your Care? No.  Actions: * If pain score is 4 or above:  Pt c/o of same right upper quadrant abdominal pain as prior to procedure. Told pt it was ok to take tylenol and that were waiting on bx results. Will let Dr. Tarri Glenn know how shes doing.

## 2019-11-27 NOTE — Telephone Encounter (Signed)
Pathology results from her recent endoscopy are still pending.  Please offer the patient a follow-up appointment with me or Jaclyn Shaggy to further address her pain.  Thank you.

## 2019-11-27 NOTE — Telephone Encounter (Signed)
No answer for post procedure follow up call. Left message and will call patient back this afternoon.

## 2019-11-27 NOTE — Telephone Encounter (Signed)
Is at work and is requesting for Judson Roch or nurse to please call her back at her work number which is [phone]. Is aware she was calling to check up pon her after procedure but she wants to please speak to nurse instead of me.

## 2019-11-28 ENCOUNTER — Encounter: Payer: Self-pay | Admitting: Gastroenterology

## 2019-11-29 NOTE — Telephone Encounter (Signed)
Left message for pt to call back  °

## 2019-11-29 NOTE — Telephone Encounter (Signed)
Spoke with pt and she is aware. States she is doing better. She will call back if she starts having issues again and she will be contacted when results are back.

## 2019-12-17 DIAGNOSIS — R87619 Unspecified abnormal cytological findings in specimens from cervix uteri: Secondary | ICD-10-CM | POA: Diagnosis not present

## 2019-12-17 DIAGNOSIS — R8769 Abnormal cytological findings in specimens from other female genital organs: Secondary | ICD-10-CM | POA: Diagnosis not present

## 2020-04-04 ENCOUNTER — Ambulatory Visit
Admission: RE | Admit: 2020-04-04 | Discharge: 2020-04-04 | Disposition: A | Discharge status: Home / Self Care | Payer: BC Managed Care – PPO | Source: Ambulatory Visit | Attending: Obstetrics and Gynecology | Admitting: Obstetrics and Gynecology

## 2020-04-04 DIAGNOSIS — Z803 Family history of malignant neoplasm of breast: Secondary | ICD-10-CM | POA: Diagnosis not present

## 2020-04-04 DIAGNOSIS — Z9189 Other specified personal risk factors, not elsewhere classified: Secondary | ICD-10-CM

## 2020-04-04 LAB — HM MAMMOGRAPHY

## 2020-04-04 MED ORDER — GADOBUTROL 1 MMOL/ML IV SOLN
8.0000 mL | Freq: Once | INTRAVENOUS | Status: AC | PRN
  Administered 2020-04-04: 8 mL via INTRAVENOUS

## 2020-05-13 ENCOUNTER — Encounter: Payer: Self-pay | Admitting: Family Medicine

## 2020-05-27 DIAGNOSIS — H1132 Conjunctival hemorrhage, left eye: Secondary | ICD-10-CM | POA: Diagnosis not present

## 2020-06-13 ENCOUNTER — Ambulatory Visit (HOSPITAL_COMMUNITY)
Admission: RE | Admit: 2020-06-13 | Discharge: 2020-06-13 | Disposition: A | Discharge status: Home / Self Care | Payer: BC Managed Care – PPO | Source: Ambulatory Visit | Attending: Internal Medicine | Admitting: Internal Medicine

## 2020-06-13 ENCOUNTER — Other Ambulatory Visit: Payer: Self-pay

## 2020-06-13 ENCOUNTER — Inpatient Hospital Stay: Payer: BC Managed Care – PPO | Attending: Internal Medicine

## 2020-06-13 DIAGNOSIS — Z08 Encounter for follow-up examination after completed treatment for malignant neoplasm: Secondary | ICD-10-CM | POA: Insufficient documentation

## 2020-06-13 DIAGNOSIS — K449 Diaphragmatic hernia without obstruction or gangrene: Secondary | ICD-10-CM | POA: Diagnosis not present

## 2020-06-13 DIAGNOSIS — C3490 Malignant neoplasm of unspecified part of unspecified bronchus or lung: Secondary | ICD-10-CM | POA: Diagnosis not present

## 2020-06-13 DIAGNOSIS — M47814 Spondylosis without myelopathy or radiculopathy, thoracic region: Secondary | ICD-10-CM | POA: Diagnosis not present

## 2020-06-13 DIAGNOSIS — Z85118 Personal history of other malignant neoplasm of bronchus and lung: Secondary | ICD-10-CM | POA: Insufficient documentation

## 2020-06-13 DIAGNOSIS — R918 Other nonspecific abnormal finding of lung field: Secondary | ICD-10-CM | POA: Diagnosis not present

## 2020-06-13 LAB — CBC WITH DIFFERENTIAL (CANCER CENTER ONLY)
Abs Immature Granulocytes: 0.02 10*3/uL (ref 0.00–0.07)
Basophils Absolute: 0.1 10*3/uL (ref 0.0–0.1)
Basophils Relative: 1 %
Eosinophils Absolute: 0.2 10*3/uL (ref 0.0–0.5)
Eosinophils Relative: 3 %
HCT: 40.6 % (ref 36.0–46.0)
Hemoglobin: 13.4 g/dL (ref 12.0–15.0)
Immature Granulocytes: 0 %
Lymphocytes Relative: 24 %
Lymphs Abs: 1.9 10*3/uL (ref 0.7–4.0)
MCH: 26.7 pg (ref 26.0–34.0)
MCHC: 33 g/dL (ref 30.0–36.0)
MCV: 81 fL (ref 80.0–100.0)
Monocytes Absolute: 0.4 10*3/uL (ref 0.1–1.0)
Monocytes Relative: 5 %
Neutro Abs: 5.3 10*3/uL (ref 1.7–7.7)
Neutrophils Relative %: 67 %
Platelet Count: 216 10*3/uL (ref 150–400)
RBC: 5.01 MIL/uL (ref 3.87–5.11)
RDW: 13.3 % (ref 11.5–15.5)
WBC Count: 7.9 10*3/uL (ref 4.0–10.5)
nRBC: 0 % (ref 0.0–0.2)

## 2020-06-13 LAB — CMP (CANCER CENTER ONLY)
ALT: 15 U/L (ref 0–44)
AST: 16 U/L (ref 15–41)
Albumin: 4 g/dL (ref 3.5–5.0)
Alkaline Phosphatase: 71 U/L (ref 38–126)
Anion gap: 9 (ref 5–15)
BUN: 12 mg/dL (ref 6–20)
CO2: 25 mmol/L (ref 22–32)
Calcium: 9.5 mg/dL (ref 8.9–10.3)
Chloride: 104 mmol/L (ref 98–111)
Creatinine: 0.74 mg/dL (ref 0.44–1.00)
GFR, Estimated: >60 mL/min (ref >60)
Glucose, Bld: 102 mg/dL — ABNORMAL HIGH (ref 70–99)
Potassium: 3.8 mmol/L (ref 3.5–5.1)
Sodium: 138 mmol/L (ref 135–145)
Total Bilirubin: 2.2 mg/dL — ABNORMAL HIGH (ref 0.3–1.2)
Total Protein: 7.4 g/dL (ref 6.5–8.1)

## 2020-06-13 MED ORDER — IOHEXOL 300 MG/ML  SOLN
75.0000 mL | Freq: Once | INTRAMUSCULAR | Status: AC | PRN
  Administered 2020-06-13: 75 mL via INTRAVENOUS

## 2020-06-16 ENCOUNTER — Inpatient Hospital Stay (HOSPITAL_BASED_OUTPATIENT_CLINIC_OR_DEPARTMENT_OTHER): Payer: BC Managed Care – PPO | Admitting: Internal Medicine

## 2020-06-16 ENCOUNTER — Other Ambulatory Visit: Payer: Self-pay

## 2020-06-16 ENCOUNTER — Encounter: Payer: Self-pay | Admitting: Internal Medicine

## 2020-06-16 VITALS — BP 136/90 | HR 65 | Temp 97.9°F | Resp 17 | Wt 190.7 lb

## 2020-06-16 DIAGNOSIS — I10 Essential (primary) hypertension: Secondary | ICD-10-CM

## 2020-06-16 DIAGNOSIS — Z85118 Personal history of other malignant neoplasm of bronchus and lung: Secondary | ICD-10-CM

## 2020-06-16 DIAGNOSIS — Z08 Encounter for follow-up examination after completed treatment for malignant neoplasm: Secondary | ICD-10-CM | POA: Diagnosis not present

## 2020-06-16 DIAGNOSIS — C3491 Malignant neoplasm of unspecified part of right bronchus or lung: Secondary | ICD-10-CM | POA: Diagnosis not present

## 2020-06-16 DIAGNOSIS — C3490 Malignant neoplasm of unspecified part of unspecified bronchus or lung: Secondary | ICD-10-CM | POA: Diagnosis not present

## 2020-06-16 NOTE — Progress Notes (Signed)
Monica [name] Telephone:([phone]   Fax:([phone]) [phone]  OFFICE PROGRESS NOTE  Monica Bush, MD [place] [address]  DIAGNOSIS: Stage IA (T1a, N0, M0) non-small cell lung cancer, adenocarcinoma presented with right upper lobe pulmonary nodule diagnosed in November 2017.  PRIOR THERAPY:status post right upper lobectomy with lymph node dissection in November 2017 under the care of Dr. Roxan Hockey.  CURRENT THERAPY: Observation.  INTERVAL HISTORY: Monica [name] 54 y.o. female returns to the clinic today for annual follow-up visit.  The patient is feeling fine today with no concerning complaints.  She denied having any chest pain, shortness breath, cough or hemoptysis.  She denied having any fever or chills.  She has no nausea, vomiting, diarrhea or constipation.  She has no headache or visual changes.  The patient had repeat CT scan of the chest performed recently and she is here for evaluation and discussion of her scan results.  MEDICAL HISTORY: Past Medical History:  Diagnosis Date  . Allergy    seasonal  . Essential hypertension   . History of chicken pox   . Hyperbilirubinemia 01/19/2017  . Hyperlipidemia   . Hypothyroidism   . Primary adenocarcinoma of upper lobe of right lung ([facility]) 05/28/2016   RUL apex spiculated ~2cm nodule concerning by PET scan - referred to multidisciplinary thoracic oncology clinic S/p thoracoscopic RULobectomy Roxan Hockey) 06/2016  . Sarcoidosis, lung (Monica [name]) ~2004   treated with prendisone and stable since then    ALLERGIES:  has No Known Allergies.  MEDICATIONS:  Current Outpatient Medications  Medication Sig Dispense Refill  . albuterol (VENTOLIN HFA) 108 (90 Base) MCG/ACT inhaler Inhale 2 puffs into the lungs every 6 (six) hours as needed for wheezing or shortness of breath. (Patient not taking: Reported on 11/16/2019) 18 g 3  . Cholecalciferol (VITAMIN D-3) 1000 units CAPS Take 1,000 Units by  mouth at bedtime.    . dicyclomine (BENTYL) 10 MG capsule Take 1 capsule (10 mg total) by mouth 2 (two) times daily as needed for spasms. 3 capsule 0  . fluticasone (FLONASE) 50 MCG/ACT nasal spray Place 1 spray into both nostrils daily as needed for allergies. 16 g 0  . hydrochlorothiazide (HYDRODIURIL) 25 MG tablet TAKE 1 TABLET(25 MG) BY MOUTH DAILY 90 tablet 3  . ibuprofen (ADVIL,MOTRIN) 200 MG tablet Take 400 mg by mouth every 6 (six) hours as needed for mild pain.     Marland Kitchen levothyroxine (SYNTHROID) 75 MCG tablet Take 1 tablet (75 mcg total) by mouth daily before breakfast. 90 tablet 3  . losartan (COZAAR) 25 MG tablet Take 1 tablet (25 mg total) by mouth daily. 90 tablet 3  . pantoprazole (PROTONIX) 40 MG tablet Take 1 tablet (40 mg total) by mouth 2 (two) times daily. 90 tablet 3  . Potassium 99 MG TABS Take by mouth daily.    Marland Kitchen Spacer/Aero-Holding Monica [name] Use as directed with albuterol inhaler 1 Units 0  . valACYclovir (VALTREX) 1000 MG tablet Take 1,000 mg by mouth 2 (two) times daily as needed (fever blisters).     No current facility-administered medications for this visit.    SURGICAL HISTORY:  Past Surgical History:  Procedure Laterality Date  . CARDIOVASCULAR STRESS TEST  12/2016   Nuclear CP stress test: hypertensive response to exercise, normal low risk study, EF 68%  . COLONOSCOPY  09/2015   WNL Monica [name])  . ENDOMETRIAL ABLATION W/ NOVASURE  01/2009   Monica [name] 12/02/2010  . LOBECTOMY  Right 06/14/2016   RU Lobectomy; Surgeon: Monica Nakayama, MD  . LYMPH NODE DISSECTION Right 06/14/2016   RIGHT UPPER LOBE LYMPH NODE DISSECTION;  Surgeon: Monica Nakayama, MD  . Monica [name] 1998  . TUBAL LIGATION  2001  . VIDEO ASSISTED THORACOSCOPY (VATS)/WEDGE RESECTION Right 06/14/2016   Surgeon: Monica Nakayama, MD    REVIEW OF SYSTEMS:  A comprehensive review of systems was negative.   PHYSICAL EXAMINATION: General appearance: alert, cooperative  and no distress Head: Normocephalic, without obvious abnormality, atraumatic Neck: no adenopathy, no JVD, supple, symmetrical, trachea midline and thyroid not enlarged, symmetric, no tenderness/mass/nodules Lymph nodes: Cervical, supraclavicular, and axillary nodes normal. Resp: clear to auscultation bilaterally Back: symmetric, no curvature. ROM normal. No CVA tenderness. Cardio: regular rate and rhythm, S1, S2 normal, no murmur, click, rub or gallop GI: soft, non-tender; bowel sounds normal; no masses,  no organomegaly Extremities: extremities normal, atraumatic, no cyanosis or edema  ECOG PERFORMANCE STATUS: 0 - Asymptomatic  Blood pressure 136/90, pulse 65, temperature 97.9 F (36.6 C), temperature source Tympanic, resp. rate 17, weight 190 lb 11.2 oz (86.5 kg), SpO2 100 %.  LABORATORY DATA: Lab Results  Component Value Date   WBC 7.9 06/13/2020   HGB 13.4 06/13/2020   HCT 40.6 06/13/2020   MCV 81.0 06/13/2020   PLT 216 06/13/2020      Chemistry      Component Value Date/Time   NA 138 06/13/2020 0844   NA 140 07/20/2017 1338   K 3.8 06/13/2020 0844   K 4.1 07/20/2017 1338   CL 104 06/13/2020 0844   CO2 25 06/13/2020 0844   CO2 26 07/20/2017 1338   BUN 12 06/13/2020 0844   BUN 13.4 07/20/2017 1338   CREATININE 0.74 06/13/2020 0844   CREATININE 0.8 07/20/2017 1338      Component Value Date/Time   CALCIUM 9.5 06/13/2020 0844   CALCIUM 9.7 07/20/2017 1338   ALKPHOS 71 06/13/2020 0844   ALKPHOS 77 07/20/2017 1338   AST 16 06/13/2020 0844   AST 15 07/20/2017 1338   ALT 15 06/13/2020 0844   ALT 12 07/20/2017 1338   BILITOT 2.2 (H) 06/13/2020 0844   BILITOT 1.71 (H) 07/20/2017 1338       RADIOGRAPHIC STUDIES: CT Chest W Contrast  Result Date: 06/13/2020 CLINICAL DATA:  Primary Cancer Type: Lung Imaging Indication: Routine surveillance Interval therapy since last imaging? No Initial Cancer Diagnosis Date: 06/14/2016; Established by: Biopsy-proven Detailed  Pathology: Stage IA non-small cell lung cancer, adenocarcinoma. Primary Tumor location: Right upper lobe. Surgeries: Right upper lobectomy 06/14/2016. Chemotherapy: No Immunotherapy? No Radiation therapy? No EXAM: CT CHEST WITH CONTRAST TECHNIQUE: Multidetector CT imaging of the chest was performed during intravenous contrast administration. CONTRAST:  41mL OMNIPAQUE IOHEXOL 300 MG/ML  SOLN COMPARISON:  Most recent CT chest 06/15/2019.  06/07/2016 PET-CT. FINDINGS: Cardiovascular: Normal heart size. No significant pericardial effusion/thickening. Great vessels are normal in course and caliber. No central pulmonary emboli. Mediastinum/Nodes: No discrete thyroid nodules. Unremarkable esophagus. No pathologically enlarged axillary, mediastinal or hilar lymph nodes. Lungs/Pleura: No pneumothorax. No pleural effusion. Status post right upper lobectomy. No acute consolidative airspace disease or lung masses. Tiny solid anterior right lower lobe 2 mm solid pulmonary nodule (series 5/image 75) is stable and considered benign. Several tiny ground-glass pulmonary nodules scattered in both lungs, largest 4 mm posteriorly in the left upper lobe (series 5/image 55), all stable. No new significant pulmonary nodules. Upper abdomen: Small hiatal hernia. Subcentimeter hypodense anterior liver dome  lesion is too small to characterize and not appreciably changed, presumably benign. Musculoskeletal: No aggressive appearing focal osseous lesions. Mild thoracic spondylosis. IMPRESSION: 1. Stable chest CT. No evidence of local tumor recurrence status post right upper lobectomy. 2. No findings worrisome for metastatic disease in the chest. 3. Scattered tiny ground-glass pulmonary nodules in both lungs are all stable and warrant continued annual chest CT surveillance. Electronically Signed   By: Ilona Sorrel M.D.   On: 06/13/2020 11:50    ASSESSMENT AND PLAN: This is a very pleasant 54 years old white female with stage IA non-small cell  lung cancer, adenocarcinoma presented with right upper lobe pulmonary nodule is status post right upper lobectomy with lymph node dissection in November 2017 under the care of Dr. Roxan Hockey. The patient has been in observation since that time and she is feeling fine today with no concerning complaints. She had repeat CT scan of the chest performed recently.  I personally and independently reviewed the scans and discussed the results with the patient today. Her scan showed no concerning findings for disease recurrence or metastasis. I recommended for her to continue on observation with repeat CT scan of the chest in 1 year. The patient was advised to call immediately if she has any concerning symptoms in the interval. The patient voices understanding of current disease status and treatment options and is in agreement with the current care plan. All questions were answered. The patient knows to call the clinic with any problems, questions or concerns. We can certainly see the patient much sooner if necessary.  Disclaimer: This note was dictated with voice recognition software. Similar sounding words can inadvertently be transcribed and may not be corrected upon review.

## 2020-06-19 ENCOUNTER — Other Ambulatory Visit: Payer: Self-pay | Admitting: Family Medicine

## 2020-06-19 DIAGNOSIS — C3491 Malignant neoplasm of unspecified part of right bronchus or lung: Secondary | ICD-10-CM

## 2020-06-19 DIAGNOSIS — E7800 Pure hypercholesterolemia, unspecified: Secondary | ICD-10-CM

## 2020-06-19 DIAGNOSIS — Z1159 Encounter for screening for other viral diseases: Secondary | ICD-10-CM

## 2020-06-19 DIAGNOSIS — E039 Hypothyroidism, unspecified: Secondary | ICD-10-CM

## 2020-06-19 DIAGNOSIS — E559 Vitamin D deficiency, unspecified: Secondary | ICD-10-CM

## 2020-06-20 ENCOUNTER — Other Ambulatory Visit (INDEPENDENT_AMBULATORY_CARE_PROVIDER_SITE_OTHER): Payer: BC Managed Care – PPO

## 2020-06-20 ENCOUNTER — Other Ambulatory Visit: Payer: Self-pay

## 2020-06-20 DIAGNOSIS — E039 Hypothyroidism, unspecified: Secondary | ICD-10-CM | POA: Diagnosis not present

## 2020-06-20 DIAGNOSIS — Z1159 Encounter for screening for other viral diseases: Secondary | ICD-10-CM | POA: Diagnosis not present

## 2020-06-20 DIAGNOSIS — E559 Vitamin D deficiency, unspecified: Secondary | ICD-10-CM | POA: Diagnosis not present

## 2020-06-20 DIAGNOSIS — E7800 Pure hypercholesterolemia, unspecified: Secondary | ICD-10-CM | POA: Diagnosis not present

## 2020-06-20 LAB — LIPID PANEL
Cholesterol: 231 mg/dL — ABNORMAL HIGH (ref 0–200)
HDL: 54.1 mg/dL (ref >39.00)
LDL Cholesterol: 153 mg/dL — ABNORMAL HIGH (ref 0–99)
NonHDL: 176.94
Total CHOL/HDL Ratio: 4
Triglycerides: 119 mg/dL (ref 0.0–149.0)
VLDL: 23.8 mg/dL (ref 0.0–40.0)

## 2020-06-20 LAB — T4, FREE: Free T4: 1.25 ng/dL (ref 0.60–1.60)

## 2020-06-20 LAB — TSH: TSH: 2.68 u[IU]/mL (ref 0.35–4.50)

## 2020-06-20 LAB — VITAMIN D 25 HYDROXY (VIT D DEFICIENCY, FRACTURES): VITD: 31.86 ng/mL (ref 30.00–100.00)

## 2020-06-23 LAB — HEPATITIS C ANTIBODY
Hepatitis C Ab: NONREACTIVE
SIGNAL TO CUT-OFF: 0.02 (ref <1.00)

## 2020-07-04 ENCOUNTER — Encounter: Payer: Self-pay | Admitting: Family Medicine

## 2020-07-04 ENCOUNTER — Ambulatory Visit (INDEPENDENT_AMBULATORY_CARE_PROVIDER_SITE_OTHER): Payer: BC Managed Care – PPO | Admitting: Family Medicine

## 2020-07-04 ENCOUNTER — Other Ambulatory Visit: Payer: Self-pay

## 2020-07-04 VITALS — BP 118/82 | HR 79 | Temp 97.9°F | Ht 61.5 in | Wt 189.6 lb

## 2020-07-04 DIAGNOSIS — Z0000 Encounter for general adult medical examination without abnormal findings: Secondary | ICD-10-CM

## 2020-07-04 DIAGNOSIS — E559 Vitamin D deficiency, unspecified: Secondary | ICD-10-CM | POA: Diagnosis not present

## 2020-07-04 DIAGNOSIS — E039 Hypothyroidism, unspecified: Secondary | ICD-10-CM | POA: Diagnosis not present

## 2020-07-04 DIAGNOSIS — I10 Essential (primary) hypertension: Secondary | ICD-10-CM

## 2020-07-04 DIAGNOSIS — Z85118 Personal history of other malignant neoplasm of bronchus and lung: Secondary | ICD-10-CM

## 2020-07-04 DIAGNOSIS — E669 Obesity, unspecified: Secondary | ICD-10-CM

## 2020-07-04 DIAGNOSIS — E7800 Pure hypercholesterolemia, unspecified: Secondary | ICD-10-CM | POA: Diagnosis not present

## 2020-07-04 DIAGNOSIS — R1011 Right upper quadrant pain: Secondary | ICD-10-CM

## 2020-07-04 DIAGNOSIS — R053 Chronic cough: Secondary | ICD-10-CM

## 2020-07-04 MED ORDER — HYDROCHLOROTHIAZIDE 25 MG PO TABS
ORAL_TABLET | ORAL | 3 refills | Status: DC
Start: 2020-07-04 — End: 2021-08-21

## 2020-07-04 MED ORDER — LEVOTHYROXINE SODIUM 75 MCG PO TABS
75.0000 ug | ORAL_TABLET | Freq: Every day | ORAL | 3 refills | Status: DC
Start: 2020-07-04 — End: 2021-08-21

## 2020-07-04 MED ORDER — LOSARTAN POTASSIUM 25 MG PO TABS
25.0000 mg | ORAL_TABLET | Freq: Every day | ORAL | 3 refills | Status: DC
Start: 2020-07-04 — End: 2021-08-21

## 2020-07-04 NOTE — Assessment & Plan Note (Signed)
Chronic, stable. Continue current regimen. 

## 2020-07-04 NOTE — Progress Notes (Signed)
Patient ID: [name], female    DOB: Jun 11, 1966, 54 y.o.   MRN: [id number]  This visit was conducted in person.  BP 118/82 (BP Location: Left Arm, Patient Position: Sitting, Cuff Size: Large)   Pulse 79   Temp 97.9 F (36.6 C) (Temporal)   Ht 5' 1.5" (1.562 m)   Wt 189 lb 9 oz (86 kg)   SpO2 97%   BMI 35.24 kg/m    CC: CPE Subjective:   HPI: Monica [name] is a 54 y.o. female presenting on 07/04/2020 for Annual Exam   Increased family stress, work stress.   Stage IA lung adenocarcinoma s/p thoracoscopic RU lobectomy 06/2016 - incidentally found on CXR for sarcoidosis. Adjuvant chemo was not recommended. Followed regularly by onc Monica [name]).  RUQ abd pain - s/p EGD found ulcers in stomach. This week started having symptoms again so she restarted protonix BID.    Preventative: EGD - LA grade A reflux esophagitis, gastritis, gastric ulcer, rec PPI BID x 8 wks (Monica [name]) Colonoscopy - SSP, hemorrhoids, diverticulosis, rpt 7 yrs Monica centre manager) Well woman yearly 08/2019 WNL (Monica [name]), last pap 08/2019 WNL Mammo yearly Q6 mo alternating with breast MRI through Monica [name] - normal 08/2019, Birads1 MRI 04/2020 LMP 2010 s/p endometrial ablation, h/o polyps  BRCA gene negative Flu shot yearly  Tdap4/2018  COVID vaccine Moderna 08/2019, 09/2019, 07/2020 Shingrix - thinks 03/2020, 06/2020 (Monica [name]) Seat belt use discussed Sunscreen use discussed. No changing moles on skin.Sees derm regularly. Non smoker  Alcohol - none  Eye exam - yearly Dentist - Q6 months  Lives with husband (disabled) and son, 1 cat and dog Occupation: Psychologist, educational with Monica [name] Activity: no regular exercise - wants to start walking Diet: good water, fruits/vegetables daily     Relevant past medical, surgical, family and social history reviewed and updated as indicated. Interim medical history since our last visit reviewed. Allergies and medications reviewed and updated. Outpatient Medications  Prior to Visit  Medication Sig Dispense Refill  . albuterol (VENTOLIN HFA) 108 (90 Base) MCG/ACT inhaler Inhale 2 puffs into the lungs every 6 (six) hours as needed for wheezing or shortness of breath. 18 g 3  . Cholecalciferol (VITAMIN D-3) 1000 units CAPS Take 1,000 Units by mouth at bedtime.    . dicyclomine (BENTYL) 10 MG capsule Take 1 capsule (10 mg total) by mouth 2 (two) times daily as needed for spasms. 3 capsule 0  . fluticasone (FLONASE) 50 MCG/ACT nasal spray Place 1 spray into both nostrils daily as needed for allergies. 16 g 0  . pantoprazole (PROTONIX) 40 MG tablet Take 1 tablet (40 mg total) by mouth 2 (two) times daily. 90 tablet 3  . Potassium 99 MG TABS Take by mouth daily.    Marland Kitchen Spacer/Aero-Holding Dorise Bullion Use as directed with albuterol inhaler 1 Units 0  . valACYclovir (VALTREX) 1000 MG tablet Take 1,000 mg by mouth 2 (two) times daily as needed (fever blisters).    . hydrochlorothiazide (HYDRODIURIL) 25 MG tablet TAKE 1 TABLET(25 MG) BY MOUTH DAILY 90 tablet 3  . levothyroxine (SYNTHROID) 75 MCG tablet Take 1 tablet (75 mcg total) by mouth daily before breakfast. 90 tablet 3  . losartan (COZAAR) 25 MG tablet Take 1 tablet (25 mg total) by mouth daily. 90 tablet 3  . ibuprofen (ADVIL,MOTRIN) 200 MG tablet Take 400 mg by mouth every 6 (six) hours as needed for mild pain.      No facility-administered medications prior to visit.  Per HPI unless specifically indicated in ROS section below Review of Systems  Constitutional: Negative for activity change, appetite change, chills, fatigue, fever and unexpected weight change.  HENT: Negative for hearing loss.   Eyes: Negative for visual disturbance.  Respiratory: Negative for cough, chest tightness, shortness of breath and wheezing.   Cardiovascular: Negative for chest pain, palpitations and leg swelling.  Gastrointestinal: Negative for abdominal distention, abdominal pain, blood in stool, constipation, diarrhea, nausea  and vomiting.  Genitourinary: Negative for difficulty urinating and hematuria.  Musculoskeletal: Negative for arthralgias, myalgias and neck pain.  Skin: Negative for rash.  Neurological: Negative for dizziness, seizures, syncope and headaches.  Hematological: Negative for adenopathy. Does not bruise/bleed easily.  Psychiatric/Behavioral: Negative for dysphoric mood. The patient is not nervous/anxious.    Objective:  BP 118/82 (BP Location: Left Arm, Patient Position: Sitting, Cuff Size: Large)   Pulse 79   Temp 97.9 F (36.6 C) (Temporal)   Ht 5' 1.5" (1.562 m)   Wt 189 lb 9 oz (86 kg)   SpO2 97%   BMI 35.24 kg/m   Wt Readings from Last 3 Encounters:  07/04/20 189 lb 9 oz (86 kg)  06/16/20 190 lb 11.2 oz (86.5 kg)  11/23/19 188 lb (85.3 kg)      Physical Exam Vitals and nursing note reviewed.  Constitutional:      General: She is not in acute distress.    Appearance: Normal appearance. She is well-developed. She is not ill-appearing.  HENT:     Head: Normocephalic and atraumatic.     Right Ear: Hearing, tympanic membrane, ear canal and external ear normal.     Left Ear: Hearing, tympanic membrane, ear canal and external ear normal.     Mouth/Throat:     Pharynx: Uvula midline.  Eyes:     General: No scleral icterus.    Extraocular Movements: Extraocular movements intact.     Conjunctiva/sclera: Conjunctivae normal.     Pupils: Pupils are equal, round, and reactive to light.  Neck:     Thyroid: No thyroid mass or thyromegaly.  Cardiovascular:     Rate and Rhythm: Normal rate and regular rhythm.     Pulses: Normal pulses.          Radial pulses are 2+ on the right side and 2+ on the left side.     Heart sounds: Normal heart sounds. No murmur heard.   Pulmonary:     Effort: Pulmonary effort is normal. No respiratory distress.     Breath sounds: Normal breath sounds. No wheezing, rhonchi or rales.  Abdominal:     General: Abdomen is flat. Bowel sounds are normal.  There is no distension.     Palpations: Abdomen is soft. There is no mass.     Tenderness: There is no abdominal tenderness. There is no guarding or rebound.     Hernia: No hernia is present.  Musculoskeletal:        General: Normal range of motion.     Cervical back: Normal range of motion and neck supple.     Right lower leg: No edema.     Left lower leg: No edema.  Lymphadenopathy:     Cervical: No cervical adenopathy.  Skin:    General: Skin is warm and dry.     Findings: No rash.  Neurological:     General: No focal deficit present.     Mental Status: She is alert and oriented to person, place, and time.  Comments: CN grossly intact, station and gait intact  Psychiatric:        Mood and Affect: Mood normal.        Behavior: Behavior normal.        Thought Content: Thought content normal.        Judgment: Judgment normal.       Results for orders placed or performed in visit on 06/20/20  Hepatitis C antibody  Result Value Ref Range   Hepatitis C Ab NON-REACTIVE NON-REACTI   SIGNAL TO CUT-OFF 0.02 <1.00  T4, free  Result Value Ref Range   Free T4 1.25 0.60 - 1.60 ng/dL  VITAMIN D 25 Hydroxy (Vit-D Deficiency, Fractures)  Result Value Ref Range   VITD 31.86 30.00 - 100.00 ng/mL  TSH  Result Value Ref Range   TSH 2.68 0.35 - 4.50 uIU/mL  Lipid panel  Result Value Ref Range   Cholesterol 231 (H) 0 - 200 mg/dL   Triglycerides 119.0 0 - 149 mg/dL   HDL 54.10 >39.00 mg/dL   VLDL 23.8 0.0 - 40.0 mg/dL   LDL Cholesterol 153 (H) 0 - 99 mg/dL   Total CHOL/HDL Ratio 4    NonHDL 176.94    Lab Results  Component Value Date   CREATININE 0.74 06/13/2020   BUN 12 06/13/2020   NA 138 06/13/2020   K 3.8 06/13/2020   CL 104 06/13/2020   CO2 25 06/13/2020    Assessment & Plan:  This visit occurred during the SARS-CoV-2 public health emergency.  Safety protocols were in place, including screening questions prior to the visit, additional usage of staff PPE, and extensive  cleaning of exam room while observing appropriate contact time as indicated for disinfecting solutions.   Problem List Items Addressed This Visit    Vitamin D deficiency    Chronic, stable on vit D 1000 IU daily replacement.       RUQ abdominal pain    Symptoms recurring - has restarted PPI BID      Obesity, Class II, BMI 35-39.9, no comorbidity    Encouraged ongoing sustainable weight loss through healthy diet and lifestyle choices      Hypothyroidism    Chronic, stable. Continue current regimen.       Relevant Medications   levothyroxine (SYNTHROID) 75 MCG tablet   Hyperlipidemia    Chronic, off meds. Reviewed ASCVD risk with patient.  The 10-year ASCVD risk score Mikey Bussing [name]., et al., 2013) is: 2.7%   Values used to calculate the score:     Age: 54 years     Sex: Female     Is Non-Hispanic African American: No     Diabetic: No     Tobacco smoker: No     Systolic Blood Pressure: 370 mmHg     Is BP treated: Yes     HDL Cholesterol: 54.1 mg/dL     Total Cholesterol: 231 mg/dL       Relevant Medications   hydrochlorothiazide (HYDRODIURIL) 25 MG tablet   losartan (COZAAR) 25 MG tablet   HTN (hypertension)    Chronic, stable. Continue current regimen.       Relevant Medications   hydrochlorothiazide (HYDRODIURIL) 25 MG tablet   losartan (COZAAR) 25 MG tablet   History of adenocarcinoma of lung    Appreciate onc care.       Health maintenance examination - Primary    Preventative protocols reviewed and updated unless pt declined. Discussed healthy diet and lifestyle.  Chronic cough    Likely due to silent GERD. Has restarted PPI          Meds ordered this encounter  Medications  . levothyroxine (SYNTHROID) 75 MCG tablet    Sig: Take 1 tablet (75 mcg total) by mouth daily before breakfast.    Dispense:  90 tablet    Refill:  3  . hydrochlorothiazide (HYDRODIURIL) 25 MG tablet    Sig: TAKE 1 TABLET(25 MG) BY MOUTH DAILY    Dispense:  90 tablet     Refill:  3  . losartan (COZAAR) 25 MG tablet    Sig: Take 1 tablet (25 mg total) by mouth daily.    Dispense:  90 tablet    Refill:  3   No orders of the defined types were placed in this encounter.   Patient instructions: We will request records from Monica Radene Knee for latest pap smear  You are doing well today  Return as needed or in 1 year for next physical.   Follow up plan: Return in about 1 year (around 07/04/2021) for annual exam, prior fasting for blood work.  [name], MD

## 2020-07-04 NOTE — Assessment & Plan Note (Addendum)
Chronic, off meds. Reviewed ASCVD risk with patient.  The 10-year ASCVD risk score Monica Bussing [name]., et al., 2013) is: 2.7%   Values used to calculate the score:     Age: 54 years     Sex: Female     Is Non-Hispanic African American: No     Diabetic: No     Tobacco smoker: No     Systolic Blood Pressure: 469 mmHg     Is BP treated: Yes     HDL Cholesterol: 54.1 mg/dL     Total Cholesterol: 231 mg/dL

## 2020-07-04 NOTE — Assessment & Plan Note (Signed)
Encouraged ongoing sustainable weight loss through healthy diet and lifestyle choices

## 2020-07-04 NOTE — Assessment & Plan Note (Signed)
Preventative protocols reviewed and updated unless pt declined. Discussed healthy diet and lifestyle.  

## 2020-07-04 NOTE — Assessment & Plan Note (Signed)
Chronic, stable on vit D 1000 IU daily replacement.

## 2020-07-04 NOTE — Assessment & Plan Note (Signed)
Appreciate onc care.  

## 2020-07-04 NOTE — Assessment & Plan Note (Signed)
Likely due to silent GERD. Has restarted PPI

## 2020-07-04 NOTE — Assessment & Plan Note (Signed)
Symptoms recurring - has restarted PPI BID

## 2020-07-04 NOTE — Patient Instructions (Addendum)
We will request records from Dr Radene Knee for latest pap smear  You are doing well today  Return as needed or in 1 year for next physical.   Health Maintenance for Postmenopausal Women Menopause is a normal process in which your ability to get pregnant comes to an end. This process happens slowly over many months or years, usually between the ages of 68 and 13. Menopause is complete when you have missed your menstrual periods for 12 months. It is important to talk with your health care provider about some of the most common conditions that affect women after menopause (postmenopausal women). These include heart disease, cancer, and bone loss (osteoporosis). Adopting a healthy lifestyle and getting preventive care can help to promote your health and wellness. The actions you take can also lower your chances of developing some of these common conditions. What should I know about menopause? During menopause, you may get a number of symptoms, such as:  Hot flashes. These can be moderate or severe.  Night sweats.  Decrease in sex drive.  Mood swings.  Headaches.  Tiredness.  Irritability.  Memory problems.  Insomnia. Choosing to treat or not to treat these symptoms is a decision that you make with your health care provider. Do I need hormone replacement therapy?  Hormone replacement therapy is effective in treating symptoms that are caused by menopause, such as hot flashes and night sweats.  Hormone replacement carries certain risks, especially as you become older. If you are thinking about using estrogen or estrogen with progestin, discuss the benefits and risks with your health care provider. What is my risk for heart disease and stroke? The risk of heart disease, heart attack, and stroke increases as you age. One of the causes may be a change in the body's hormones during menopause. This can affect how your body uses dietary fats, triglycerides, and cholesterol. Heart attack and stroke  are medical emergencies. There are many things that you can do to help prevent heart disease and stroke. Watch your blood pressure  High blood pressure causes heart disease and increases the risk of stroke. This is more likely to develop in people who have high blood pressure readings, are of African descent, or are overweight.  Have your blood pressure checked: ? Every 3-5 years if you are 36-54 years of age. ? Every year if you are 67 years old or older. Eat a healthy diet   Eat a diet that includes plenty of vegetables, fruits, low-fat dairy products, and lean protein.  Do not eat a lot of foods that are high in solid fats, added sugars, or sodium. Get regular exercise Get regular exercise. This is one of the most important things you can do for your health. Most adults should:  Try to exercise for at least 150 minutes each week. The exercise should increase your heart rate and make you sweat (moderate-intensity exercise).  Try to do strengthening exercises at least twice each week. Do these in addition to the moderate-intensity exercise.  Spend less time sitting. Even light physical activity can be beneficial. Other tips  Work with your health care provider to achieve or maintain a healthy weight.  Do not use any products that contain nicotine or tobacco, such as cigarettes, e-cigarettes, and chewing tobacco. If you need help quitting, ask your health care provider.  Know your numbers. Ask your health care provider to check your cholesterol and your blood sugar (glucose). Continue to have your blood tested as directed by your health  care provider. Do I need screening for cancer? Depending on your health history and family history, you may need to have cancer screening at different stages of your life. This may include screening for:  Breast cancer.  Cervical cancer.  Lung cancer.  Colorectal cancer. What is my risk for osteoporosis? After menopause, you may be at increased  risk for osteoporosis. Osteoporosis is a condition in which bone destruction happens more quickly than new bone creation. To help prevent osteoporosis or the bone fractures that can happen because of osteoporosis, you may take the following actions:  If you are 70-18 years old, get at least 1,000 mg of calcium and at least 600 mg of vitamin D per day.  If you are older than age 85 but younger than age 61, get at least 1,200 mg of calcium and at least 600 mg of vitamin D per day.  If you are older than age 21, get at least 1,200 mg of calcium and at least 800 mg of vitamin D per day. Smoking and drinking excessive alcohol increase the risk of osteoporosis. Eat foods that are rich in calcium and vitamin D, and do weight-bearing exercises several times each week as directed by your health care provider. How does menopause affect my mental health? Depression may occur at any age, but it is more common as you become older. Common symptoms of depression include:  Low or sad mood.  Changes in sleep patterns.  Changes in appetite or eating patterns.  Feeling an overall lack of motivation or enjoyment of activities that you previously enjoyed.  Frequent crying spells. Talk with your health care provider if you think that you are experiencing depression. General instructions See your health care provider for regular wellness exams and vaccines. This may include:  Scheduling regular health, dental, and eye exams.  Getting and maintaining your vaccines. These include: ? Influenza vaccine. Get this vaccine each year before the flu season begins. ? Pneumonia vaccine. ? Shingles vaccine. ? Tetanus, diphtheria, and pertussis (Tdap) booster vaccine. Your health care provider may also recommend other immunizations. Tell your health care provider if you have ever been abused or do not feel safe at home. Summary  Menopause is a normal process in which your ability to get pregnant comes to an  end.  This condition causes hot flashes, night sweats, decreased interest in sex, mood swings, headaches, or lack of sleep.  Treatment for this condition may include hormone replacement therapy.  Take actions to keep yourself healthy, including exercising regularly, eating a healthy diet, watching your weight, and checking your blood pressure and blood sugar levels.  Get screened for cancer and depression. Make sure that you are up to date with all your vaccines. This information is not intended to replace advice given to you by your health care provider. Make sure you discuss any questions you have with your health care provider. Document Revised: 07/12/2018 Document Reviewed: 07/12/2018 Elsevier Patient Education  2020 [employer].

## 2020-07-14 ENCOUNTER — Encounter: Payer: Self-pay | Admitting: Family Medicine

## 2020-07-31 ENCOUNTER — Telehealth: Payer: Self-pay | Admitting: Family Medicine

## 2020-07-31 MED ORDER — VALACYCLOVIR HCL 1 G PO TABS: 2000.0000 mg | ORAL_TABLET | Freq: Two times a day (BID) | ORAL | 1 refills | Status: DC

## 2020-07-31 NOTE — Telephone Encounter (Signed)
Valtrex Last OV:  07/04/20, CPE Next OV:  none

## 2020-07-31 NOTE — Telephone Encounter (Signed)
Pt called in due to when she has a fever blister Dr. Darnell Level prescribed valtrax and she hasnt had one in years and this morning she had one on her lip and wanted to know if she can get a prescription called in.  Preferred pharmacy : [facility] [address], [place], [place] [address]  Ph: [phone]

## 2020-07-31 NOTE — Telephone Encounter (Signed)
ERx 

## 2020-09-16 ENCOUNTER — Encounter: Payer: Self-pay | Admitting: Emergency Medicine

## 2020-09-16 ENCOUNTER — Other Ambulatory Visit: Payer: Self-pay

## 2020-09-16 ENCOUNTER — Ambulatory Visit
Admission: EM | Admit: 2020-09-16 | Discharge: 2020-09-16 | Disposition: A | Discharge status: Home / Self Care | Payer: BC Managed Care – PPO | Attending: Family Medicine | Admitting: Family Medicine

## 2020-09-16 ENCOUNTER — Telehealth: Payer: Self-pay | Admitting: *Deleted

## 2020-09-16 DIAGNOSIS — R42 Dizziness and giddiness: Secondary | ICD-10-CM | POA: Diagnosis not present

## 2020-09-16 MED ORDER — ONDANSETRON 4 MG PO TBDP: 4.0000 mg | ORAL_TABLET | Freq: Three times a day (TID) | ORAL | 0 refills | Status: DC | PRN

## 2020-09-16 MED ORDER — MECLIZINE HCL 25 MG PO TABS: 25.0000 mg | ORAL_TABLET | Freq: Three times a day (TID) | ORAL | 0 refills | Status: DC | PRN

## 2020-09-16 MED ORDER — ONDANSETRON 4 MG PO TBDP
4.0000 mg | ORAL_TABLET | Freq: Once | ORAL | Status: AC
  Administered 2020-09-16: 4 mg via ORAL

## 2020-09-16 NOTE — ED Provider Notes (Signed)
[facility]    CSN: [id number] Arrival date & time: 09/16/20  1057      History   Chief Complaint Chief Complaint  Patient presents with  . Dizziness  . Nausea    HPI Monica [name] is [name] 55 y.o. female.   Patient is [name] 55 year old female with past Monica [name] history of allergy, hypertension, hyperlipidemia, lung cancer.  She presents today with sudden onset of dizziness, nausea with waking this morning.  Dizziness with certain head movements.  Feels off balance.  No headache, blurred vision.  Did have mild headache last night but was not bad not to take any kind of pain medication.  This resolved this morning.  No ear pain, sore throat, cough, chest congestion or fevers.  No vomiting or diarrhea.  No extremity weakness, numbness or tingling.  No slurred speech. No chest pain, SOB or recent illness. No new medicines.  History of vertigo approximately 20 years ago     Past Medical History:  Diagnosis Date  . Allergy    seasonal  . Essential hypertension   . History of chicken pox   . Hyperbilirubinemia 01/19/2017  . Hyperlipidemia   . Hypothyroidism   . Primary adenocarcinoma of upper lobe of right lung ([place]) 05/28/2016   RUL apex spiculated ~2cm nodule concerning by PET scan - referred to multidisciplinary thoracic oncology clinic S/p thoracoscopic RULobectomy Monica [name]) 06/2016  . Sarcoidosis, lung ([place]) ~2004   treated with prendisone and stable since then    Patient Active Problem List   Diagnosis Date Noted  . Adenocarcinoma of right lung, stage 1 ([place]) 06/16/2020  . RUQ abdominal pain 11/17/2018  . 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 HTN (hypertension) 12/30/2016  . Hyperlipidemia 11/19/2016  . Fever blister 11/19/2016  . Chronic cough 08/17/2016  . ETD (Eustachian tube dysfunction), right 08/17/2016  . Globus sensation 07/09/2016  . History of adenocarcinoma of lung 05/28/2016  . Bilateral carpal tunnel syndrome 05/28/2016  . Primary osteoarthritis of  first carpometacarpal joint of right hand 05/28/2016  . Trigger finger of right thumb 05/28/2016  . Health maintenance examination 04/01/2015  . Vitamin D deficiency 04/01/2015  . Obesity, Class II, BMI 35-39.9, no comorbidity 04/01/2015  . History of sarcoidosis 10/10/2014  . Hypothyroidism 10/10/2014    Past Surgical History:  Procedure Laterality Date  . CARDIOVASCULAR STRESS TEST  12/2016   Nuclear CP stress test: hypertensive response to exercise, normal low risk study, EF 68%  . COLONOSCOPY  09/2015   WNL Monica [name])  . ENDOMETRIAL ABLATION W/ NOVASURE  01/2009   Monica [name] 12/02/2010  . LOBECTOMY Right 06/14/2016   RU Lobectomy; Surgeon: Monica Nakayama, MD  . LYMPH NODE DISSECTION Right 06/14/2016   RIGHT UPPER LOBE LYMPH NODE DISSECTION;  Surgeon: Monica Nakayama, MD  . Gem Bilateral 1998  . TUBAL LIGATION  2001  . VIDEO ASSISTED THORACOSCOPY (VATS)/WEDGE RESECTION Right 06/14/2016   Surgeon: Monica Nakayama, MD    OB History   No obstetric history on file.      Home Medications    Prior to Admission medications   Medication Sig Start Date End Date Taking? Authorizing Provider  Cholecalciferol (VITAMIN D-3) 1000 units CAPS Take 1,000 Units by mouth at bedtime.   Yes [provider]  dicyclomine (BENTYL) 10 MG capsule Take 1 capsule (10 mg total) by mouth 2 (two) times daily as needed for spasms. 09/28/19  Yes Monica [name], Monica [name]  hydrochlorothiazide (HYDRODIURIL) 25 MG tablet TAKE 1 TABLET(25  MG) BY MOUTH DAILY 07/04/20  Yes Monica Bush, MD  levothyroxine (SYNTHROID) 75 MCG tablet Take 1 tablet (75 mcg total) by mouth daily before breakfast. 07/04/20  Yes Monica Bush, MD  losartan (COZAAR) 25 MG tablet Take 1 tablet (25 mg total) by mouth daily. 07/04/20  Yes Monica Bush, MD  meclizine (ANTIVERT) 25 MG tablet Take 1 tablet (25 mg total) by mouth 3 (three) times daily as needed for dizziness. 09/16/20  Yes Monica [name],  Monica [name], Monica [name]  ondansetron (ZOFRAN ODT) 4 MG disintegrating tablet Take 1 tablet (4 mg total) by mouth every 8 (eight) hours as needed for nausea or vomiting. 09/16/20  Yes Monica [name], Monica [name]  Potassium 99 MG TABS Take by mouth daily.   Yes [provider]  albuterol (VENTOLIN HFA) 108 (90 Base) MCG/ACT inhaler Inhale 2 puffs into the lungs every 6 (six) hours as needed for wheezing or shortness of breath. 06/08/19   Monica Bush, MD  fluticasone [facility]) 50 MCG/ACT nasal spray Place 1 spray into both nostrils daily as needed for allergies. 12/31/16   Monica [name], Monica [name], Monica [name]  pantoprazole (PROTONIX) 40 MG tablet Take 1 tablet (40 mg total) by mouth 2 (two) times daily. 11/23/19   Monica Park, MD  Spacer/Aero-Holding Dorise Bullion Use as directed with albuterol inhaler 06/08/19   Monica Bush, MD  valACYclovir (VALTREX) 1000 MG tablet Take 2 tablets (2,000 mg total) by mouth 2 (two) times daily. For 1 day as needed for fever blister 07/31/20   Monica Bush, MD    Family History Family History  Problem Relation Age of Onset  . Hypertension Mother   . Breast cancer Mother   . Prostate cancer Father 45  . CAD Maternal Grandmother        MI  . Stroke Maternal Grandmother   . Stroke Paternal Grandmother   . CAD Paternal Grandmother   . Diabetes Paternal Grandfather   . Colon cancer Neg Hx   . Liver cancer Neg Hx   . Colon polyps Neg Hx   . Esophageal cancer Neg Hx   . Rectal cancer Neg Hx   . Stomach cancer Neg Hx     Social History Social History   Tobacco Use  . Smoking status: Never Smoker  . Smokeless tobacco: Never Used  Vaping Use  . Vaping Use: Never used  Substance Use Topics  . Alcohol use: No    Alcohol/week: 0.0 standard drinks  . Drug use: No     Allergies   Patient has no known allergies.   Review of Systems Review of Systems   Physical Exam Triage Vital Signs ED Triage Vitals  Enc Vitals Group     BP 09/16/20 1108 130/85     Pulse  Rate 09/16/20 1108 71     Resp 09/16/20 1108 18     Temp 09/16/20 1108 98 F (36.7 C)     Temp Source 09/16/20 1108 Oral     SpO2 09/16/20 1108 99 %     Weight --      Height --      Head Circumference --      Peak Flow --      Pain Score 09/16/20 1111 0     Pain Loc --      Pain Edu? --      Excl. in [name]? --    No data found.  Updated Vital Signs BP 130/85 (BP Location: Left Arm)   Pulse 71   Temp 98 F (  36.7 C) (Oral)   Resp 18   SpO2 99%   Visual Acuity Right Eye Distance:   Left Eye Distance:   Bilateral Distance:    Right Eye Near:   Left Eye Near:    Bilateral Near:     Physical Exam Vitals and nursing note reviewed.  Constitutional:      General: She is not in acute distress.    Appearance: Normal appearance. She is not ill-appearing, toxic-appearing or diaphoretic.  HENT:     Head: Normocephalic.     Right Ear: Tympanic membrane, ear canal and external ear normal.     Left Ear: Tympanic membrane, ear canal and external ear normal.     Nose: Nose normal.     Mouth/Throat:     Pharynx: Oropharynx is clear.  Eyes:     Extraocular Movements: Extraocular movements intact.     Conjunctiva/sclera: Conjunctivae normal.     Pupils: Pupils are equal, round, and reactive to light.  Cardiovascular:     Rate and Rhythm: Normal rate and regular rhythm.     Pulses: Normal pulses.     Heart sounds: Normal heart sounds.  Pulmonary:     Effort: Pulmonary effort is normal.     Breath sounds: Normal breath sounds.  Musculoskeletal:        General: Normal range of motion.     Cervical back: Normal range of motion.  Skin:    General: Skin is warm and dry.     Findings: No rash.  Neurological:     General: No focal deficit present.     Mental Status: She is alert.     Cranial Nerves: No cranial nerve deficit.     Sensory: No sensory deficit.     Motor: No weakness.     Coordination: Coordination normal.  Psychiatric:        Mood and Affect: Mood normal.       UC Treatments / Results  Labs (all labs ordered are listed, but only abnormal results are displayed) Labs Reviewed - No data to display  EKG   Radiology No results found.  Procedures Procedures (including critical care time)  Medications Ordered in UC Medications  ondansetron (ZOFRAN-ODT) disintegrating tablet 4 mg (4 mg Oral Given 09/16/20 1139)    Initial Impression / Assessment and Plan / UC Course  I have reviewed the triage vital signs and the nursing notes.  Pertinent labs & imaging results that were available during my care of the patient were reviewed by me and considered in my medical decision making (see chart for details).      Vertigo Most likely diagnosis based on exam, symptoms No specific concerns on exam.  No neurological deficits.  No concern for CVA at this time. Will prescribe meclizine for dizziness and Zofran for nausea as needed. Recommend rest, hydrate and ER precautions given Follow up as needed for continued or worsening symptoms  Final Clinical Impressions(s) / UC Diagnoses   Final diagnoses:  Vertigo     Discharge Instructions     I believe that this is vertigo.  I am prescribing meclizine for dizziness and Zofran for nausea and vomiting as needed.  Rest, Hydrate.  If symptoms worsen please go to the ER.     ED Prescriptions    Medication Sig Dispense Auth. Provider   meclizine (ANTIVERT) 25 MG tablet Take 1 tablet (25 mg total) by mouth 3 (three) times daily as needed for dizziness. 30 tablet Monica July, Monica [name]  ondansetron (ZOFRAN ODT) 4 MG disintegrating tablet Take 1 tablet (4 mg total) by mouth every 8 (eight) hours as needed for nausea or vomiting. 20 tablet Loura Halt [name], Monica [name]     PDMP not reviewed this encounter.   Monica July, Monica [name] 09/16/20 1235

## 2020-09-16 NOTE — Discharge Instructions (Addendum)
I believe that this is vertigo.  I am prescribing meclizine for dizziness and Zofran for nausea and vomiting as needed.  Rest, Hydrate.  If symptoms worsen please go to the ER.

## 2020-09-16 NOTE — ED Triage Notes (Signed)
Pt presents today with dizziness and nausea that began when she woke up. No meds pta.

## 2020-09-16 NOTE — Telephone Encounter (Signed)
Patient's husband came into the office stating that his wife is in the car. Patient's husband stated that they tried to call the office for the past hour and the line was busy. Patient's husband stated that his wife woke up this morning dizzy, very lightheaded when getting up and trying to move around.  Patient's husband stated that his wife's cell number is [phone] Called and spoke to patient and was advised that she had a headache last night but that is better today. Patient stated that she feels like the room is spinning and is nauseated when she moves her head. Patient stated years ago she had vertigo. Patient denies cough, fever, sore throat or any other covid symptoms.  After speaking with Dr. Danise Mina patient was advised that she needs a face to face evaluation today. Patient was advised that she should go to an UC to be evaluated. Patient agreed to go to [facility] when they leave our office.

## 2020-09-16 NOTE — Telephone Encounter (Signed)
Spoke with pt asking for and update.  States the vertigo is not any better just yet.  Room is still spinning when she moves her head.  However, Zofran has helped with the nausea.

## 2020-09-16 NOTE — Telephone Encounter (Signed)
Noted. Sent to [facility].  Dx vertigo treated with meclizine and zofran. plz call this afternoon for update on vertigo.

## 2020-09-16 NOTE — Telephone Encounter (Signed)
Noted.  To schedule OV if not better over next few days.

## 2020-09-17 NOTE — Telephone Encounter (Signed)
Spoke with pt relaying Dr. Synthia Innocent message.  She verbalizes understanding.

## 2020-09-17 NOTE — Telephone Encounter (Signed)
Pt called in and stated that she is still experiencing dizziness and everything is moving in slow motion

## 2020-09-22 ENCOUNTER — Other Ambulatory Visit: Payer: Self-pay

## 2020-09-22 ENCOUNTER — Encounter: Payer: Self-pay | Admitting: Family Medicine

## 2020-09-22 ENCOUNTER — Ambulatory Visit (INDEPENDENT_AMBULATORY_CARE_PROVIDER_SITE_OTHER): Payer: BC Managed Care – PPO | Admitting: Family Medicine

## 2020-09-22 VITALS — BP 118/80 | HR 93 | Temp 97.6°F | Ht 61.5 in | Wt 186.4 lb

## 2020-09-22 DIAGNOSIS — R42 Dizziness and giddiness: Secondary | ICD-10-CM

## 2020-09-22 DIAGNOSIS — I10 Essential (primary) hypertension: Secondary | ICD-10-CM

## 2020-09-22 DIAGNOSIS — Z85118 Personal history of other malignant neoplasm of bronchus and lung: Secondary | ICD-10-CM | POA: Diagnosis not present

## 2020-09-22 DIAGNOSIS — E7800 Pure hypercholesterolemia, unspecified: Secondary | ICD-10-CM | POA: Diagnosis not present

## 2020-09-22 NOTE — Assessment & Plan Note (Signed)
Stable period on current regimen - continue.

## 2020-09-22 NOTE — Assessment & Plan Note (Signed)
Stable period, no evidence of recurrence.

## 2020-09-22 NOTE — Assessment & Plan Note (Signed)
Story/exam most consistent with BPPV - reviewed with patient. Overall improving over the past week with rest and meclizine, no further nausea. Provided with modified epley canalith repositioning exercises to try at home. Update if not improving each day, would consider head CT r/o stroke mass or other cause. Pt agrees with plan.

## 2020-09-22 NOTE — Patient Instructions (Addendum)
This does sound like positional vertigo. May continue meclizine as needed.  Do exercises provided today.  Let us know if not noticing improvement in symptoms each day to consider head CT.   Benign Positional Vertigo Vertigo is the feeling that you or your surroundings are moving when they are not. Benign positional vertigo is the most common form of vertigo. This is usually a harmless condition (benign). This condition is positional. This means that symptoms are triggered by certain movements and positions. This condition can be dangerous if it occurs while you are doing something that could cause harm to you or others. This includes activities such as driving or operating machinery. What are the causes? The inner ear has fluid-filled canals that help your brain sense movement and balance. When the fluid moves, the brain receives messages about your body's position. With benign positional vertigo, crystals in the inner ear break free and disturb the inner ear area. This causes your brain to receive confusing messages about your body's position. What increases the risk? You are more likely to develop this condition if:  You are a woman.  You are 56 years of age or older.  You have recently had a head injury.  You have an inner ear disease. What are the signs or symptoms? Symptoms of this condition usually happen when you move your head or your eyes in different directions. Symptoms may start suddenly, and usually last for less than a minute. They include:  Loss of balance and falling.  Feeling like you are spinning or moving.  Feeling like your surroundings are spinning or moving.  Nausea and vomiting.  Blurred vision.  Dizziness.  Involuntary eye movement (nystagmus). Symptoms can be mild and cause only minor problems, or they can be severe and interfere with daily life. Episodes of benign positional vertigo may return (recur) over time. Symptoms may improve over time. How is  this diagnosed? This condition may be diagnosed based on:  Your medical history.  Physical exam of the head, neck, and ears.  Positional tests to check for or stimulate vertigo. You may be asked to turn your head and change positions, such as going from sitting to lying down. A health care provider will watch for symptoms of vertigo. You may be referred to a health care provider who specializes in ear, nose, and throat problems (ENT, or otolaryngologist) or a provider who specializes in disorders of the nervous system (neurologist). How is this treated? This condition may be treated in a session in which your health care provider moves your head in specific positions to help the displaced crystals in your inner ear move. Treatment for this condition may take several sessions. Surgery may be needed in severe cases, but this is rare. In some cases, benign positional vertigo may resolve on its own in 2-4 weeks.   Follow these instructions at home: Safety  Move slowly. Avoid sudden body or head movements or certain positions, as told by your health care provider.  Avoid driving until your health care provider says it is safe for you to do so.  Avoid operating heavy machinery until your health care provider says it is safe for you to do so.  Avoid doing any tasks that would be dangerous to you or others if vertigo occurs.  If you have trouble walking or keeping your balance, try using a cane for stability. If you feel dizzy or unstable, sit down right away.  Return to your normal activities as told by your  health care provider. Ask your health care provider what activities are safe for you. General instructions  Take over-the-counter and prescription medicines only as told by your health care provider.  Drink enough fluid to keep your urine pale yellow.  Keep all follow-up visits as told by your health care provider. This is important. Contact a health care provider if:  You have a  fever.  Your condition gets worse or you develop new symptoms.  Your family or friends notice any behavioral changes.  You have nausea or vomiting that gets worse.  You have numbness or a prickling and tingling sensation. Get help right away if you:  Have difficulty speaking or moving.  Are always dizzy.  Faint.  Develop severe headaches.  Have weakness in your legs or arms.  Have changes in your hearing or vision.  Develop a stiff neck.  Develop sensitivity to light. Summary  Vertigo is the feeling that you or your surroundings are moving when they are not. Benign positional vertigo is the most common form of vertigo.  This condition is caused by crystals in the inner ear that become displaced. This causes a disturbance in an area of the inner ear that helps your brain sense movement and balance.  Symptoms include loss of balance and falling, feeling that you or your surroundings are moving, nausea and vomiting, and blurred vision.  This condition can be diagnosed based on symptoms, a physical exam, and positional tests.  Follow safety instructions as told by your health care provider. You will also be told when to contact your health care provider in case of problems. This information is not intended to replace advice given to you by your health care provider. Make sure you discuss any questions you have with your health care provider. Document Revised: 06/12/2019 Document Reviewed: 12/28/2017 Elsevier Patient Education  2021 [employer].

## 2020-09-22 NOTE — Progress Notes (Signed)
Patient ID: Monica [name], female    DOB: 12-26-1965, 55 y.o.   MRN: [id number]  This visit was conducted in person.  BP 118/80   Pulse 93   Temp 97.6 F (36.4 C) (Temporal)   Ht 5' 1.5" (1.562 m)   Wt 186 lb 7 oz (84.6 kg)   SpO2 98%   BMI 34.66 kg/m   No data found.  CC: ongoing vertigo  Subjective:   HPI: Monica [name] is a 55 y.o. female presenting on 09/22/2020 for Dizziness (C/o ongoing vertigo.  Seen on 09/16/20 at [facility] for same sxs. Given meclizine and Zofran for nausea.  Now feels like everything is in slow motion.  Says Pt accompanied by husband, Jeneen Rinks- temp 97.9.)   Seen last week at [facility] for vertigo which started 2/15. Treated with zofran with benefit. Discharged on meclizine and zofran prn. Symptoms started last Tuesday morning when she woke up - head felt heavy associated with vertigo worse with room spinning. Mild headache treated with tylenol. Bad nausea initially, no vomiting.  Remaining imbalance when standing, feels like she's moving "in slow motion".  Nausea has improved.  Better when sitting or laying down. Tried some home exercises which seemed to have helped.   No vision changes, unilateral weakness/numbness, slurred speech. No hearing changes or tinnitus.   Hypertension - BP well controlled on hctz and losartan 25mg  daily.  HLD not on statin.  Non smoker.  Lab Results  Component Value Date   CHOL 231 (H) 06/20/2020   HDL 54.10 06/20/2020   LDLCALC 153 (H) 06/20/2020   TRIG 119.0 06/20/2020   CHOLHDL 4 06/20/2020        Relevant past medical, surgical, family and social history reviewed and updated as indicated. Interim medical history since our last visit reviewed. Allergies and medications reviewed and updated. Outpatient Medications Prior to Visit  Medication Sig Dispense Refill  . albuterol (VENTOLIN HFA) 108 (90 Base) MCG/ACT inhaler Inhale 2 puffs into the lungs every 6 (six) hours as needed for wheezing or shortness of  breath. 18 g 3  . Cholecalciferol (VITAMIN D-3) 1000 units CAPS Take 1,000 Units by mouth at bedtime.    . dicyclomine (BENTYL) 10 MG capsule Take 1 capsule (10 mg total) by mouth 2 (two) times daily as needed for spasms. 3 capsule 0  . fluticasone (FLONASE) 50 MCG/ACT nasal spray Place 1 spray into both nostrils daily as needed for allergies. 16 g 0  . hydrochlorothiazide (HYDRODIURIL) 25 MG tablet TAKE 1 TABLET(25 MG) BY MOUTH DAILY 90 tablet 3  . levothyroxine (SYNTHROID) 75 MCG tablet Take 1 tablet (75 mcg total) by mouth daily before breakfast. 90 tablet 3  . losartan (COZAAR) 25 MG tablet Take 1 tablet (25 mg total) by mouth daily. 90 tablet 3  . meclizine (ANTIVERT) 25 MG tablet Take 1 tablet (25 mg total) by mouth 3 (three) times daily as needed for dizziness. 30 tablet 0  . ondansetron (ZOFRAN ODT) 4 MG disintegrating tablet Take 1 tablet (4 mg total) by mouth every 8 (eight) hours as needed for nausea or vomiting. 20 tablet 0  . pantoprazole (PROTONIX) 40 MG tablet Take 1 tablet (40 mg total) by mouth 2 (two) times daily. 90 tablet 3  . Potassium 99 MG TABS Take by mouth daily.    Marland Kitchen Spacer/Aero-Holding Dorise Bullion Use as directed with albuterol inhaler 1 Units 0  . valACYclovir (VALTREX) 1000 MG tablet Take 2 tablets (2,000 mg total) by  mouth 2 (two) times daily. For 1 day as needed for fever blister 12 tablet 1   No facility-administered medications prior to visit.     Per HPI unless specifically indicated in ROS section below Review of Systems Objective:  BP 118/80   Pulse 93   Temp 97.6 F (36.4 C) (Temporal)   Ht 5' 1.5" (1.562 m)   Wt 186 lb 7 oz (84.6 kg)   SpO2 98%   BMI 34.66 kg/m   Wt Readings from Last 3 Encounters:  09/22/20 186 lb 7 oz (84.6 kg)  07/04/20 189 lb 9 oz (86 kg)  06/16/20 190 lb 11.2 oz (86.5 kg)      Physical Exam Vitals and nursing note reviewed.  Constitutional:      Appearance: Normal appearance. She is not ill-appearing.  Neck:      Vascular: No carotid bruit.  Cardiovascular:     Rate and Rhythm: Normal rate and regular rhythm.     Pulses: Normal pulses.     Heart sounds: Normal heart sounds. No murmur heard.   Pulmonary:     Effort: Pulmonary effort is normal. No respiratory distress.     Breath sounds: Normal breath sounds. No wheezing, rhonchi or rales.  Musculoskeletal:     Cervical back: Normal range of motion and neck supple.     Right lower leg: No edema.     Left lower leg: No edema.  Skin:    General: Skin is warm and dry.     Findings: No rash.  Neurological:     Mental Status: She is alert.     Comments:  CN 2-12 intact FTN intact  EOMI Sensation intact Dix Hallpike negative bilaterally  Psychiatric:        Mood and Affect: Mood normal.        Behavior: Behavior normal.       Results for orders placed or performed in visit on 07/14/20  HM PAP SMEAR  Result Value Ref Range   HM Pap smear      Negative for intraepithelial lesions or malignancy; negative HPV   Lab Results  Component Value Date   WBC 7.9 06/13/2020   HGB 13.4 06/13/2020   HCT 40.6 06/13/2020   MCV 81.0 06/13/2020   PLT 216 06/13/2020   Lab Results  Component Value Date   CREATININE 0.74 06/13/2020   BUN 12 06/13/2020   NA 138 06/13/2020   K 3.8 06/13/2020   CL 104 06/13/2020   CO2 25 06/13/2020    Lab Results  Component Value Date   TSH 2.68 06/20/2020   Assessment & Plan:  This visit occurred during the SARS-CoV-2 public health emergency.  Safety protocols were in place, including screening questions prior to the visit, additional usage of staff PPE, and extensive cleaning of exam room while observing appropriate contact time as indicated for disinfecting solutions.   Problem List Items Addressed This Visit    Vertigo - Primary    Story/exam most consistent with BPPV - reviewed with patient. Overall improving over the past week with rest and meclizine, no further nausea. Provided with modified epley  canalith repositioning exercises to try at home. Update if not improving each day, would consider head CT r/o stroke mass or other cause. Pt agrees with plan.       Hyperlipidemia   HTN (hypertension)    Stable period on current regimen - continue.       History of adenocarcinoma of lung  Stable period, no evidence of recurrence.           No orders of the defined types were placed in this encounter.  No orders of the defined types were placed in this encounter.   Patient instructions: This does sound like positional vertigo. May continue meclizine as needed.  Do exercises provided today.  Let us know if not noticing improvement in symptoms each day to consider head CT.   Follow up plan: No follow-ups on file.  [name], MD

## 2020-10-03 ENCOUNTER — Encounter: Payer: Self-pay | Admitting: Emergency Medicine

## 2020-10-03 ENCOUNTER — Ambulatory Visit
Admission: EM | Admit: 2020-10-03 | Discharge: 2020-10-03 | Disposition: A | Discharge status: Home / Self Care | Payer: BC Managed Care – PPO

## 2020-10-03 ENCOUNTER — Other Ambulatory Visit: Payer: Self-pay

## 2020-10-03 DIAGNOSIS — R0982 Postnasal drip: Secondary | ICD-10-CM

## 2020-10-03 NOTE — ED Provider Notes (Signed)
[name]    CSN: [id number] Arrival date & time: 10/03/20  1003      History   Chief Complaint Chief Complaint  Patient presents with  . Sore Throat  . nasal drip  . facial pressure    HPI Monica [name] is a 55 y.o. female.   Pt is a 55 year old female that presents with sorethroat, post nasal drip, cough sx started yesterday. Using Flonase. No fever. Hx of allergies. No sick contacts.           Past Medical History:  Diagnosis Date  . Allergy    seasonal  . Essential hypertension   . History of chicken pox   . Hyperbilirubinemia 01/19/2017  . Hyperlipidemia   . Hypothyroidism   . Primary adenocarcinoma of upper lobe of right lung ([place]) 05/28/2016   RUL apex spiculated ~2cm nodule concerning by PET scan - referred to multidisciplinary thoracic oncology clinic S/p thoracoscopic RULobectomy Roxan Hockey) 06/2016  . Sarcoidosis, lung ([place]) ~2004   treated with prendisone and stable since then    Patient Active Problem List   Diagnosis Date Noted  . Vertigo 09/22/2020  . Adenocarcinoma of right lung, stage 1 ([place]) 06/16/2020  . RUQ abdominal pain 11/17/2018  . 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 HTN (hypertension) 12/30/2016  . Hyperlipidemia 11/19/2016  . Fever blister 11/19/2016  . Chronic cough 08/17/2016  . ETD (Eustachian tube dysfunction), right 08/17/2016  . Globus sensation 07/09/2016  . History of adenocarcinoma of lung 05/28/2016  . Bilateral carpal tunnel syndrome 05/28/2016  . Primary osteoarthritis of first carpometacarpal joint of right hand 05/28/2016  . Trigger finger of right thumb 05/28/2016  . Health maintenance examination 04/01/2015  . Vitamin D deficiency 04/01/2015  . Obesity, Class II, BMI 35-39.9, no comorbidity 04/01/2015  . History of sarcoidosis 10/10/2014  . Hypothyroidism 10/10/2014    Past Surgical History:  Procedure Laterality Date  . CARDIOVASCULAR STRESS TEST  12/2016   Nuclear CP stress test:  hypertensive response to exercise, normal low risk study, EF 68%  . COLONOSCOPY  09/2015   WNL [name])  . ENDOMETRIAL ABLATION W/ NOVASURE  01/2009   Archie Endo 12/02/2010  . LOBECTOMY Right 06/14/2016   RU Lobectomy; Surgeon: Melrose Nakayama, MD  . LYMPH NODE DISSECTION Right 06/14/2016   RIGHT UPPER LOBE LYMPH NODE DISSECTION;  Surgeon: Melrose Nakayama, MD  . [place] Bilateral 1998  . TUBAL LIGATION  2001  . VIDEO ASSISTED THORACOSCOPY (VATS)/WEDGE RESECTION Right 06/14/2016   Surgeon: Melrose Nakayama, MD    OB History   No obstetric history on file.      Home Medications    Prior to Admission medications   Medication Sig Start Date End Date Taking? Authorizing Provider  albuterol (VENTOLIN HFA) 108 (90 Base) MCG/ACT inhaler Inhale 2 puffs into the lungs every 6 (six) hours as needed for wheezing or shortness of breath. 06/08/19   [name], MD  Cholecalciferol (VITAMIN D-3) 1000 units CAPS Take 1,000 Units by mouth at bedtime.    [provider]  dicyclomine (BENTYL) 10 MG capsule Take 1 capsule (10 mg total) by mouth 2 (two) times daily as needed for spasms. 09/28/19   Noralyn Pick, NP  fluticasone (FLONASE) 50 MCG/ACT nasal spray Place 1 spray into both nostrils daily as needed for allergies. 12/31/16   Mikhail, Velta Addison, DO  hydrochlorothiazide (HYDRODIURIL) 25 MG tablet TAKE 1 TABLET(25 MG) BY MOUTH DAILY 07/04/20   [name], MD  levothyroxine (  SYNTHROID) 75 MCG tablet Take 1 tablet (75 mcg total) by mouth daily before breakfast. 07/04/20   [name], MD  losartan (COZAAR) 25 MG tablet Take 1 tablet (25 mg total) by mouth daily. 07/04/20   [name], MD  meclizine (ANTIVERT) 25 MG tablet Take 1 tablet (25 mg total) by mouth 3 (three) times daily as needed for dizziness. 09/16/20   Loura Halt A, NP  ondansetron (ZOFRAN ODT) 4 MG disintegrating tablet Take 1 tablet (4 mg total) by mouth every 8 (eight) hours as  needed for nausea or vomiting. 09/16/20   Loura Halt A, NP  pantoprazole (PROTONIX) 40 MG tablet Take 1 tablet (40 mg total) by mouth 2 (two) times daily. 11/23/19   Thornton Park, MD  Potassium 99 MG TABS Take by mouth daily.    [provider]  Spacer/Aero-Holding Dorise Bullion Use as directed with albuterol inhaler 06/08/19   [name], MD  valACYclovir (VALTREX) 1000 MG tablet Take 2 tablets (2,000 mg total) by mouth 2 (two) times daily. For 1 day as needed for fever blister 07/31/20   [name], MD    Family History Family History  Problem Relation Age of Onset  . Hypertension Mother   . Breast cancer Mother   . Prostate cancer Father 77  . CAD Maternal Grandmother        MI  . Stroke Maternal Grandmother   . Stroke Paternal Grandmother   . CAD Paternal Grandmother   . Diabetes Paternal Grandfather   . Colon cancer Neg Hx   . Liver cancer Neg Hx   . Colon polyps Neg Hx   . Esophageal cancer Neg Hx   . Rectal cancer Neg Hx   . Stomach cancer Neg Hx     Social History Social History   Tobacco Use  . Smoking status: Never Smoker  . Smokeless tobacco: Never Used  Vaping Use  . Vaping Use: Never used  Substance Use Topics  . Alcohol use: No    Alcohol/week: 0.0 standard drinks  . Drug use: No     Allergies   Patient has no known allergies.   Review of Systems Review of Systems   Physical Exam Triage Vital Signs ED Triage Vitals  Enc Vitals Group     BP 10/03/20 1017 131/78     Pulse Rate 10/03/20 1017 84     Resp 10/03/20 1017 18     Temp 10/03/20 1017 99 F (37.2 C)     Temp Source 10/03/20 1017 Oral     SpO2 10/03/20 1017 99 %     Weight 10/03/20 1013 180 lb (81.6 kg)     Height 10/03/20 1013 5\' 2"  (1.575 m)     Head Circumference --      Peak Flow --      Pain Score 10/03/20 1013 6     Pain Loc --      Pain Edu? --      Excl. in [place]? --    No data found.  Updated Vital Signs BP 131/78 (BP Location: Left Arm)    Pulse 84   Temp 99 F (37.2 C) (Oral)   Resp 18   Ht 5\' 2"  (1.575 m)   Wt 180 lb (81.6 kg)   SpO2 99%   BMI 32.92 kg/m   Visual Acuity Right Eye Distance:   Left Eye Distance:   Bilateral Distance:    Right Eye Near:   Left Eye Near:    Bilateral  Near:     Physical Exam Vitals and nursing note reviewed.  Constitutional:      General: She is not in acute distress.    Appearance: Normal appearance. She is not ill-appearing, toxic-appearing or diaphoretic.  HENT:     Head: Normocephalic.     Right Ear: Tympanic membrane and ear canal normal.     Left Ear: Tympanic membrane and ear canal normal.     Nose: Nose normal.     Mouth/Throat:     Pharynx: Oropharynx is clear.  Eyes:     Conjunctiva/sclera: Conjunctivae normal.  Cardiovascular:     Rate and Rhythm: Normal rate and regular rhythm.  Pulmonary:     Effort: Pulmonary effort is normal.     Breath sounds: Normal breath sounds.  Musculoskeletal:        General: Normal range of motion.     Cervical back: Normal range of motion.  Skin:    General: Skin is warm and dry.     Findings: No rash.  Neurological:     Mental Status: She is alert.  Psychiatric:        Mood and Affect: Mood normal.      UC Treatments / Results  Labs (all labs ordered are listed, but only abnormal results are displayed) Labs Reviewed - No data to display  EKG   Radiology No results found.  Procedures Procedures (including critical care time)  Medications Ordered in UC Medications - No data to display  Initial Impression / Assessment and Plan / UC Course  I have reviewed the triage vital signs and the nursing notes.  Pertinent labs & imaging results that were available during my care of the patient were reviewed by me and considered in my medical decision making (see chart for details).     PND from sinuses. Most likely allergy Treating with Flonase and zyrtec.  Follow up as needed for continued or worsening  symptoms  Final Clinical Impressions(s) / UC Diagnoses   Final diagnoses:  PND (post-nasal drip)     Discharge Instructions     Flonase and zyrtec or cetrizine (generic) daily.  Follow up as needed for continued or worsening symptoms     ED Prescriptions    None     PDMP not reviewed this encounter.   Loura Halt A, NP 10/03/20 1040

## 2020-10-03 NOTE — Discharge Instructions (Signed)
Flonase and zyrtec or cetrizine (generic) daily.  Follow up as needed for continued or worsening symptoms

## 2020-10-03 NOTE — ED Triage Notes (Signed)
Patient in [place] for c/o sorethroat, post nasal drip, cough sx started yesterday.  Denied: fever,n/v,diarrhea  OTC: flonase, chloraseptic spray

## 2020-10-03 NOTE — ED Notes (Signed)
Took home covid test today (negative).

## 2020-11-27 DIAGNOSIS — Z6834 Body mass index (BMI) 34.0-34.9, adult: Secondary | ICD-10-CM | POA: Diagnosis not present

## 2020-11-27 DIAGNOSIS — Z01419 Encounter for gynecological examination (general) (routine) without abnormal findings: Secondary | ICD-10-CM | POA: Diagnosis not present

## 2020-11-27 DIAGNOSIS — Z1231 Encounter for screening mammogram for malignant neoplasm of breast: Secondary | ICD-10-CM | POA: Diagnosis not present

## 2020-11-28 ENCOUNTER — Other Ambulatory Visit: Payer: Self-pay | Admitting: Obstetrics and Gynecology

## 2020-11-28 DIAGNOSIS — Z803 Family history of malignant neoplasm of breast: Secondary | ICD-10-CM

## 2020-12-21 ENCOUNTER — Encounter: Payer: Self-pay | Admitting: Family Medicine

## 2021-02-17 DIAGNOSIS — M542 Cervicalgia: Secondary | ICD-10-CM | POA: Diagnosis not present

## 2021-02-17 DIAGNOSIS — M25511 Pain in right shoulder: Secondary | ICD-10-CM | POA: Diagnosis not present

## 2021-02-18 ENCOUNTER — Telehealth: Payer: Self-pay | Admitting: Gastroenterology

## 2021-02-18 NOTE — Telephone Encounter (Signed)
Inbound call from [place] with [facility] stating before prescribing diclofenac, they want to make sure it will not intervene with patient's GI issues.  Please advise.  Can be reached at her cell:[phone].

## 2021-02-18 NOTE — Telephone Encounter (Signed)
Ortho PA calling to see if ok to put pt on diclofenac in regards to her GI history. Please advise.

## 2021-02-19 NOTE — Telephone Encounter (Signed)
Spoke with Gann PA and she is aware and will let the pt know.

## 2021-02-19 NOTE — Telephone Encounter (Signed)
She has a history of a gastric ulcer. Without any follow-up after the diagnosis. If she is going to take diclofenac, she needs to be on pantoprazole at least daily. I would also recommend office follow-up with me or an APP. Thanks.

## 2021-03-02 DIAGNOSIS — M5412 Radiculopathy, cervical region: Secondary | ICD-10-CM | POA: Diagnosis not present

## 2021-03-06 DIAGNOSIS — M5412 Radiculopathy, cervical region: Secondary | ICD-10-CM | POA: Diagnosis not present

## 2021-03-10 DIAGNOSIS — M5412 Radiculopathy, cervical region: Secondary | ICD-10-CM | POA: Diagnosis not present

## 2021-03-20 DIAGNOSIS — M5412 Radiculopathy, cervical region: Secondary | ICD-10-CM | POA: Diagnosis not present

## 2021-04-01 DIAGNOSIS — M5412 Radiculopathy, cervical region: Secondary | ICD-10-CM | POA: Diagnosis not present

## 2021-05-04 ENCOUNTER — Encounter: Payer: Self-pay | Admitting: Emergency Medicine

## 2021-05-04 ENCOUNTER — Other Ambulatory Visit: Payer: Self-pay

## 2021-05-04 ENCOUNTER — Ambulatory Visit
Admission: EM | Admit: 2021-05-04 | Discharge: 2021-05-04 | Disposition: A | Discharge status: Home / Self Care | Payer: BC Managed Care – PPO

## 2021-05-04 DIAGNOSIS — J029 Acute pharyngitis, unspecified: Secondary | ICD-10-CM | POA: Diagnosis not present

## 2021-05-04 DIAGNOSIS — B349 Viral infection, unspecified: Secondary | ICD-10-CM

## 2021-05-04 LAB — POCT RAPID STREP A (OFFICE): Rapid Strep A Screen: NEGATIVE

## 2021-05-04 NOTE — ED Provider Notes (Signed)
Monica [name]    CSN: [id number] Arrival date & time: 05/04/21  8185      History   Chief Complaint Chief Complaint  Patient presents with   Sore Throat    HPI Monica [name] is a 55 y.o. female.  Patient presents with 1 day history of sore throat.  She also reports mild nasal congestion and cough.  She denies fever, rash, earache, shortness of breath, or other symptoms.  Treatment at home with Tylenol.  Patient requests a strep test.  Her medical history includes hypertension, sarcoidosis, adenocarcinoma of lung, chronic cough.  The history is provided by the patient and medical records.   Past Medical History:  Diagnosis Date   Allergy    seasonal   Essential hypertension    History of chicken pox    Hyperbilirubinemia 01/19/2017   Hyperlipidemia    Hypothyroidism    Primary adenocarcinoma of upper lobe of right lung ([place]) 05/28/2016   RUL apex spiculated ~2cm nodule concerning by PET scan - referred to multidisciplinary thoracic oncology clinic S/p thoracoscopic RULobectomy Roxan Hockey) 06/2016   Sarcoidosis, lung ([place]) ~2004   treated with prendisone and stable since then    Patient Active Problem List   Diagnosis Date Noted   Vertigo 09/22/2020   Adenocarcinoma of right lung, stage 1 ([place]) 06/16/2020   RUQ abdominal pain 11/17/2018   Rosanna Randy disease 01/19/2017   HTN (hypertension) 12/30/2016   Hyperlipidemia 11/19/2016   Fever blister 11/19/2016   Chronic cough 08/17/2016   ETD (Eustachian tube dysfunction), right 08/17/2016   Globus sensation 07/09/2016   History of adenocarcinoma of lung 05/28/2016   Bilateral carpal tunnel syndrome 05/28/2016   Primary osteoarthritis of first carpometacarpal joint of right hand 05/28/2016   Trigger finger of right thumb 05/28/2016   Health maintenance examination 04/01/2015   Vitamin D deficiency 04/01/2015   Obesity, Class II, BMI 35-39.9, no comorbidity 04/01/2015   History of sarcoidosis 10/10/2014    Hypothyroidism 10/10/2014    Past Surgical History:  Procedure Laterality Date   CARDIOVASCULAR STRESS TEST  12/2016   Nuclear CP stress test: hypertensive response to exercise, normal low risk study, EF 68%   COLONOSCOPY  09/2015   WNL [name])   COLONOSCOPY  11/2019   SSP, hemorrhoids, diverticulosis, rpt 7 yrs ([place])   ENDOMETRIAL ABLATION W/ NOVASURE  01/2009   Archie Endo 12/02/2010   ESOPHAGOGASTRODUODENOSCOPY  11/2019   LA Grade A reflux esophagitis, gastric ulcer, gastritis, HH ([place])   LOBECTOMY Right 06/14/2016   RU Lobectomy; Surgeon: Melrose Nakayama, MD   LYMPH NODE DISSECTION Right 06/14/2016   RIGHT UPPER LOBE LYMPH NODE DISSECTION;  Surgeon: Melrose Nakayama, MD   REFRACTIVE SURGERY Bilateral 1998   TUBAL LIGATION  2001   VIDEO ASSISTED THORACOSCOPY (VATS)/WEDGE RESECTION Right 06/14/2016   Surgeon: Melrose Nakayama, MD    OB History   No obstetric history on file.      Home Medications    Prior to Admission medications   Medication Sig Start Date End Date Taking? Authorizing Provider  acetaminophen (TYLENOL) 325 MG tablet Take 650 mg by mouth every 6 (six) hours as needed.   Yes [provider]  albuterol (VENTOLIN HFA) 108 (90 Base) MCG/ACT inhaler Inhale 2 puffs into the lungs every 6 (six) hours as needed for wheezing or shortness of breath. 06/08/19   [name], MD  Cholecalciferol (VITAMIN D-3) 1000 units CAPS Take 1,000 Units by mouth at bedtime.    [provider]  dicyclomine (BENTYL) 10 MG capsule Take 1 capsule (10 mg total) by mouth 2 (two) times daily as needed for spasms. 09/28/19   Noralyn Pick, NP  fluticasone (FLONASE) 50 MCG/ACT nasal spray Place 1 spray into both nostrils daily as needed for allergies. 12/31/16   Mikhail, Velta Addison, DO  hydrochlorothiazide (HYDRODIURIL) 25 MG tablet TAKE 1 TABLET(25 MG) BY MOUTH DAILY 07/04/20   [name], MD  levothyroxine (SYNTHROID) 75 MCG tablet Take 1  tablet (75 mcg total) by mouth daily before breakfast. 07/04/20   [name], MD  losartan (COZAAR) 25 MG tablet Take 1 tablet (25 mg total) by mouth daily. 07/04/20   [name], MD  meclizine (ANTIVERT) 25 MG tablet Take 1 tablet (25 mg total) by mouth 3 (three) times daily as needed for dizziness. 09/16/20   Loura Halt A, NP  ondansetron (ZOFRAN ODT) 4 MG disintegrating tablet Take 1 tablet (4 mg total) by mouth every 8 (eight) hours as needed for nausea or vomiting. 09/16/20   Loura Halt A, NP  pantoprazole (PROTONIX) 40 MG tablet Take 1 tablet (40 mg total) by mouth 2 (two) times daily. 11/23/19   Thornton Park, MD  Potassium 99 MG TABS Take by mouth daily.    [provider]  Spacer/Aero-Holding Dorise Bullion Use as directed with albuterol inhaler 06/08/19   [name], MD  valACYclovir (VALTREX) 1000 MG tablet Take 2 tablets (2,000 mg total) by mouth 2 (two) times daily. For 1 day as needed for fever blister 07/31/20   [name], MD    Family History Family History  Problem Relation Age of Onset   Hypertension Mother    Breast cancer Mother    Prostate cancer Father 75   CAD Maternal Grandmother        MI   Stroke Maternal Grandmother    Stroke Paternal Grandmother    CAD Paternal Grandmother    Diabetes Paternal Grandfather    Colon cancer Neg Hx    Liver cancer Neg Hx    Colon polyps Neg Hx    Esophageal cancer Neg Hx    Rectal cancer Neg Hx    Stomach cancer Neg Hx     Social History Social History   Tobacco Use   Smoking status: Never   Smokeless tobacco: Never  Vaping Use   Vaping Use: Never used  Substance Use Topics   Alcohol use: No    Alcohol/week: 0.0 standard drinks   Drug use: No     Allergies   Patient has no known allergies.   Review of Systems Review of Systems  Constitutional:  Negative for chills and fever.  HENT:  Positive for congestion and sore throat. Negative for ear pain.   Respiratory:  Positive  for cough. Negative for shortness of breath.   Cardiovascular:  Negative for chest pain and palpitations.  Gastrointestinal:  Negative for abdominal pain, diarrhea and vomiting.  Skin:  Negative for color change and rash.  All other systems reviewed and are negative.   Physical Exam Triage Vital Signs ED Triage Vitals  Enc Vitals Group     BP      Pulse      Resp      Temp      Temp src      SpO2      Weight      Height      Head Circumference      Peak Flow      Pain Score  Pain Loc      Pain Edu?      Excl. in [place]?    No data found.  Updated Vital Signs BP 125/86 (BP Location: Left Arm)   Pulse 83   Temp 98.5 F (36.9 C) (Oral)   Resp 18   SpO2 98%   Visual Acuity Right Eye Distance:   Left Eye Distance:   Bilateral Distance:    Right Eye Near:   Left Eye Near:    Bilateral Near:     Physical Exam Vitals and nursing note reviewed.  Constitutional:      General: She is not in acute distress.    Appearance: She is well-developed. She is not ill-appearing.  HENT:     Head: Normocephalic and atraumatic.     Right Ear: Tympanic membrane normal.     Left Ear: Tympanic membrane normal.     Nose: Nose normal.     Mouth/Throat:     Mouth: Mucous membranes are moist.     Pharynx: Posterior oropharyngeal erythema present.  Eyes:     Conjunctiva/sclera: Conjunctivae normal.  Cardiovascular:     Rate and Rhythm: Normal rate and regular rhythm.     Heart sounds: Normal heart sounds.  Pulmonary:     Effort: Pulmonary effort is normal. No respiratory distress.     Breath sounds: Normal breath sounds.  Abdominal:     Palpations: Abdomen is soft.     Tenderness: There is no abdominal tenderness.  Musculoskeletal:     Cervical back: Neck supple.  Skin:    General: Skin is warm and dry.  Neurological:     General: No focal deficit present.     Mental Status: She is alert and oriented to person, place, and time.     Gait: Gait normal.  Psychiatric:         Mood and Affect: Mood normal.        Behavior: Behavior normal.     UC Treatments / Results  Labs (all labs ordered are listed, but only abnormal results are displayed) Labs Reviewed  NOVEL CORONAVIRUS, NAA  POCT RAPID STREP A (OFFICE)    EKG   Radiology No results found.  Procedures Procedures (including critical care time)  Medications Ordered in UC Medications - No data to display  Initial Impression / Assessment and Plan / UC Course  I have reviewed the triage vital signs and the nursing notes.  Pertinent labs & imaging results that were available during my care of the patient were reviewed by me and considered in my medical decision making (see chart for details).  Sore throat, viral illness.  Rapid strep negative.  COVID pending.  Instructed patient to self quarantine per CDC guidelines.  Discussed symptomatic treatment including Tylenol or ibuprofen, rest, hydration.  Instructed patient to follow up with PCP if symptoms are not improving.  Patient agrees to plan of care.    Final Clinical Impressions(s) / UC Diagnoses   Final diagnoses:  Viral illness  Sore throat     Discharge Instructions      Your rapid strep test is negative.     Your COVID test is pending.  You should self quarantine until the test result is back.    Take Tylenol or ibuprofen as needed for fever or discomfort.  Rest and keep yourself hydrated.    Follow-up with your primary care provider if your symptoms are not improving.         ED Prescriptions  None    PDMP not reviewed this encounter.   Sharion Balloon, NP 05/04/21 [phone]

## 2021-05-04 NOTE — Discharge Instructions (Addendum)
Your rapid strep test is negative.     Your COVID test is pending.  You should self quarantine until the test result is back.    Take Tylenol or ibuprofen as needed for fever or discomfort.  Rest and keep yourself hydrated.    Follow-up with your primary care provider if your symptoms are not improving.

## 2021-05-04 NOTE — ED Triage Notes (Signed)
Onset yesterday evening of symptoms.  Cough, sore throat, stuffy nose.  Denies sinus drainage, no fever

## 2021-05-05 LAB — SARS-COV-2, NAA 2 DAY TAT

## 2021-05-05 LAB — NOVEL CORONAVIRUS, NAA: SARS-CoV-2, NAA: NOT DETECTED

## 2021-05-08 ENCOUNTER — Ambulatory Visit
Admission: RE | Admit: 2021-05-08 | Discharge: 2021-05-08 | Disposition: A | Discharge status: Home / Self Care | Payer: BC Managed Care – PPO | Source: Ambulatory Visit | Attending: Obstetrics and Gynecology | Admitting: Obstetrics and Gynecology

## 2021-05-08 DIAGNOSIS — Z803 Family history of malignant neoplasm of breast: Secondary | ICD-10-CM

## 2021-05-08 DIAGNOSIS — R928 Other abnormal and inconclusive findings on diagnostic imaging of breast: Secondary | ICD-10-CM | POA: Diagnosis not present

## 2021-05-08 MED ORDER — GADOBUTROL 1 MMOL/ML IV SOLN
6.0000 mL | Freq: Once | INTRAVENOUS | Status: AC | PRN
  Administered 2021-05-08: 6 mL via INTRAVENOUS

## 2021-06-12 ENCOUNTER — Inpatient Hospital Stay: Payer: BC Managed Care – PPO | Attending: Internal Medicine

## 2021-06-12 ENCOUNTER — Other Ambulatory Visit: Payer: Self-pay

## 2021-06-12 ENCOUNTER — Ambulatory Visit (HOSPITAL_COMMUNITY)
Admission: RE | Admit: 2021-06-12 | Discharge: 2021-06-12 | Disposition: A | Discharge status: Home / Self Care | Payer: BC Managed Care – PPO | Source: Ambulatory Visit | Attending: Internal Medicine | Admitting: Internal Medicine

## 2021-06-12 DIAGNOSIS — C3490 Malignant neoplasm of unspecified part of unspecified bronchus or lung: Secondary | ICD-10-CM

## 2021-06-12 DIAGNOSIS — R918 Other nonspecific abnormal finding of lung field: Secondary | ICD-10-CM | POA: Diagnosis not present

## 2021-06-12 DIAGNOSIS — C3411 Malignant neoplasm of upper lobe, right bronchus or lung: Secondary | ICD-10-CM | POA: Insufficient documentation

## 2021-06-12 LAB — CBC WITH DIFFERENTIAL (CANCER CENTER ONLY)
Abs Immature Granulocytes: 0.02 10*3/uL (ref 0.00–0.07)
Basophils Absolute: 0 10*3/uL (ref 0.0–0.1)
Basophils Relative: 1 %
Eosinophils Absolute: 0.2 10*3/uL (ref 0.0–0.5)
Eosinophils Relative: 2 %
HCT: 37.2 % (ref 36.0–46.0)
Hemoglobin: 12.7 g/dL (ref 12.0–15.0)
Immature Granulocytes: 0 %
Lymphocytes Relative: 28 %
Lymphs Abs: 2 10*3/uL (ref 0.7–4.0)
MCH: 27.4 pg (ref 26.0–34.0)
MCHC: 34.1 g/dL (ref 30.0–36.0)
MCV: 80.3 fL (ref 80.0–100.0)
Monocytes Absolute: 0.3 10*3/uL (ref 0.1–1.0)
Monocytes Relative: 5 %
Neutro Abs: 4.7 10*3/uL (ref 1.7–7.7)
Neutrophils Relative %: 64 %
Platelet Count: 281 10*3/uL (ref 150–400)
RBC: 4.63 MIL/uL (ref 3.87–5.11)
RDW: 13 % (ref 11.5–15.5)
WBC Count: 7.2 10*3/uL (ref 4.0–10.5)
nRBC: 0 % (ref 0.0–0.2)

## 2021-06-12 LAB — CMP (CANCER CENTER ONLY)
ALT: 15 U/L (ref 0–44)
AST: 15 U/L (ref 15–41)
Albumin: 4 g/dL (ref 3.5–5.0)
Alkaline Phosphatase: 76 U/L (ref 38–126)
Anion gap: 11 (ref 5–15)
BUN: 12 mg/dL (ref 6–20)
CO2: 26 mmol/L (ref 22–32)
Calcium: 9.2 mg/dL (ref 8.9–10.3)
Chloride: 103 mmol/L (ref 98–111)
Creatinine: 0.72 mg/dL (ref 0.44–1.00)
GFR, Estimated: >60 mL/min (ref >60)
Glucose, Bld: 79 mg/dL (ref 70–99)
Potassium: 3.6 mmol/L (ref 3.5–5.1)
Sodium: 140 mmol/L (ref 135–145)
Total Bilirubin: 2.3 mg/dL — ABNORMAL HIGH (ref 0.3–1.2)
Total Protein: 7.2 g/dL (ref 6.5–8.1)

## 2021-06-12 MED ORDER — IOHEXOL 350 MG/ML SOLN
75.0000 mL | Freq: Once | INTRAVENOUS | Status: AC | PRN
  Administered 2021-06-12: 75 mL via INTRAVENOUS

## 2021-06-15 ENCOUNTER — Other Ambulatory Visit: Payer: Self-pay

## 2021-06-15 ENCOUNTER — Inpatient Hospital Stay: Payer: BC Managed Care – PPO | Admitting: Internal Medicine

## 2021-06-15 VITALS — BP 129/85 | HR 90 | Temp 98.0°F | Resp 19 | Ht 62.0 in | Wt 197.2 lb

## 2021-06-15 DIAGNOSIS — C3490 Malignant neoplasm of unspecified part of unspecified bronchus or lung: Secondary | ICD-10-CM | POA: Diagnosis not present

## 2021-06-15 DIAGNOSIS — C3411 Malignant neoplasm of upper lobe, right bronchus or lung: Secondary | ICD-10-CM | POA: Diagnosis not present

## 2021-06-15 DIAGNOSIS — C3491 Malignant neoplasm of unspecified part of right bronchus or lung: Secondary | ICD-10-CM | POA: Diagnosis not present

## 2021-06-15 DIAGNOSIS — Z85118 Personal history of other malignant neoplasm of bronchus and lung: Secondary | ICD-10-CM | POA: Diagnosis not present

## 2021-06-15 DIAGNOSIS — Z902 Acquired absence of lung [part of]: Secondary | ICD-10-CM | POA: Diagnosis not present

## 2021-06-15 NOTE — Progress Notes (Signed)
[facility] Telephone:([phone]) [phone]   Fax:(336) [phone]  OFFICE PROGRESS NOTE  [name], MD [place] [id number]  DIAGNOSIS: Stage IA (T1a, N0, M0) non-small cell lung cancer, adenocarcinoma presented with right upper lobe pulmonary nodule diagnosed in November 2017.  PRIOR THERAPY:status post right upper lobectomy with lymph node dissection in November 2017 under the care of Dr. Roxan Hockey.  CURRENT THERAPY: Observation.  INTERVAL HISTORY: Monica [name] 55 y.o. female returns to the clinic today for follow-up visit.  The patient is feeling fine today with no concerning complaints.  She denied having any chest pain, shortness of breath, cough or hemoptysis.  She denied having any fever or chills.  She has no nausea, vomiting, diarrhea or constipation.  She has no headache or visual changes.  She had repeat CT scan of the chest performed recently and she is here for evaluation and discussion of her scan results.  MEDICAL HISTORY: Past Medical History:  Diagnosis Date   Allergy    seasonal   Essential hypertension    History of chicken pox    Hyperbilirubinemia 01/19/2017   Hyperlipidemia    Hypothyroidism    Primary adenocarcinoma of upper lobe of right lung ([place]) 05/28/2016   RUL apex spiculated ~2cm nodule concerning by PET scan - referred to multidisciplinary thoracic oncology clinic S/p thoracoscopic RULobectomy Roxan Hockey) 06/2016   Sarcoidosis, lung ([place]) ~2004   treated with prendisone and stable since then    ALLERGIES:  has No Known Allergies.  MEDICATIONS:  Current Outpatient Medications  Medication Sig Dispense Refill   acetaminophen (TYLENOL) 325 MG tablet Take 650 mg by mouth every 6 (six) hours as needed.     albuterol (VENTOLIN HFA) 108 (90 Base) MCG/ACT inhaler Inhale 2 puffs into the lungs every 6 (six) hours as needed for wheezing or shortness of breath. 18 g 3   Cholecalciferol (VITAMIN D-3) 1000  units CAPS Take 1,000 Units by mouth at bedtime.     dicyclomine (BENTYL) 10 MG capsule Take 1 capsule (10 mg total) by mouth 2 (two) times daily as needed for spasms. 3 capsule 0   fluticasone (FLONASE) 50 MCG/ACT nasal spray Place 1 spray into both nostrils daily as needed for allergies. 16 g 0   hydrochlorothiazide (HYDRODIURIL) 25 MG tablet TAKE 1 TABLET(25 MG) BY MOUTH DAILY 90 tablet 3   levothyroxine (SYNTHROID) 75 MCG tablet Take 1 tablet (75 mcg total) by mouth daily before breakfast. 90 tablet 3   losartan (COZAAR) 25 MG tablet Take 1 tablet (25 mg total) by mouth daily. 90 tablet 3   meclizine (ANTIVERT) 25 MG tablet Take 1 tablet (25 mg total) by mouth 3 (three) times daily as needed for dizziness. 30 tablet 0   ondansetron (ZOFRAN ODT) 4 MG disintegrating tablet Take 1 tablet (4 mg total) by mouth every 8 (eight) hours as needed for nausea or vomiting. 20 tablet 0   pantoprazole (PROTONIX) 40 MG tablet Take 1 tablet (40 mg total) by mouth 2 (two) times daily. 90 tablet 3   Potassium 99 MG TABS Take by mouth daily.     Spacer/Aero-Holding Dorise Bullion Use as directed with albuterol inhaler 1 Units 0   valACYclovir (VALTREX) 1000 MG tablet Take 2 tablets (2,000 mg total) by mouth 2 (two) times daily. For 1 day as needed for fever blister 12 tablet 1   No current facility-administered medications for this visit.    SURGICAL HISTORY:  Past  Surgical History:  Procedure Laterality Date   CARDIOVASCULAR STRESS TEST  12/2016   Nuclear CP stress test: hypertensive response to exercise, normal low risk study, EF 68%   COLONOSCOPY  09/2015   WNL [name])   COLONOSCOPY  11/2019   SSP, hemorrhoids, diverticulosis, rpt 7 yrs ([place])   ENDOMETRIAL ABLATION W/ NOVASURE  01/2009   Archie Endo 12/02/2010   ESOPHAGOGASTRODUODENOSCOPY  11/2019   LA Grade A reflux esophagitis, gastric ulcer, gastritis, HH ([place])   LOBECTOMY Right 06/14/2016   RU Lobectomy; Surgeon: Melrose Nakayama, MD    LYMPH NODE DISSECTION Right 06/14/2016   RIGHT UPPER LOBE LYMPH NODE DISSECTION;  Surgeon: Melrose Nakayama, MD   REFRACTIVE SURGERY Bilateral [place] (VATS)/WEDGE RESECTION Right 06/14/2016   Surgeon: Melrose Nakayama, MD    REVIEW OF SYSTEMS:  A comprehensive review of systems was negative.   PHYSICAL EXAMINATION: General appearance: alert, cooperative, and no distress Head: Normocephalic, without obvious abnormality, atraumatic Neck: no adenopathy, no JVD, supple, symmetrical, trachea midline, and thyroid not enlarged, symmetric, no tenderness/mass/nodules Lymph nodes: Cervical, supraclavicular, and axillary nodes normal. Resp: clear to auscultation bilaterally Back: symmetric, no curvature. ROM normal. No CVA tenderness. Cardio: regular rate and rhythm, S1, S2 normal, no murmur, click, rub or gallop GI: soft, non-tender; bowel sounds normal; no masses,  no organomegaly Extremities: extremities normal, atraumatic, no cyanosis or edema  ECOG PERFORMANCE STATUS: 0 - Asymptomatic  Blood pressure 129/85, pulse 90, temperature 98 F (36.7 C), temperature source Tympanic, resp. rate 19, height 5\' 2"  (1.575 m), weight 197 lb 3.2 oz (89.4 kg), SpO2 99 %.  LABORATORY DATA: Lab Results  Component Value Date   WBC 7.2 06/12/2021   HGB 12.7 06/12/2021   HCT 37.2 06/12/2021   MCV 80.3 06/12/2021   PLT 281 06/12/2021      Chemistry      Component Value Date/Time   NA 140 06/12/2021 1245   NA 140 07/20/2017 1338   K 3.6 06/12/2021 1245   K 4.1 07/20/2017 1338   CL 103 06/12/2021 1245   CO2 26 06/12/2021 1245   CO2 26 07/20/2017 1338   BUN 12 06/12/2021 1245   BUN 13.4 07/20/2017 1338   CREATININE 0.72 06/12/2021 1245   CREATININE 0.8 07/20/2017 1338      Component Value Date/Time   CALCIUM 9.2 06/12/2021 1245   CALCIUM 9.7 07/20/2017 1338   ALKPHOS 76 06/12/2021 1245   ALKPHOS 77 07/20/2017 1338   AST 15 06/12/2021  1245   AST 15 07/20/2017 1338   ALT 15 06/12/2021 1245   ALT 12 07/20/2017 1338   BILITOT 2.3 (H) 06/12/2021 1245   BILITOT 1.71 (H) 07/20/2017 1338       RADIOGRAPHIC STUDIES: CT Chest W Contrast  Result Date: 06/14/2021 CLINICAL DATA:  Non-small cell lung cancer restaging EXAM: CT CHEST WITH CONTRAST TECHNIQUE: Multidetector CT imaging of the chest was performed during intravenous contrast administration. CONTRAST:  28mL OMNIPAQUE IOHEXOL 350 MG/ML SOLN COMPARISON:  06/13/2020 FINDINGS: Cardiovascular: Heart size appears within normal limits. There is no pericardial effusion identified. Mild coronary artery calcifications. Mediastinum/Nodes: Normal appearance of the thyroid gland. The trachea appears patent and is midline. Normal appearance of the esophagus. No enlarged axillary, supraclavicular, mediastinal or hilar lymph nodes. Lungs/Pleura: No pleural effusion, airspace consolidation, or atelectasis. Status post right upper lobectomy. No signs of local tumor recurrence. Posterior right lower lobe lung nodule is stable measuring 4 mm,  image 80/5. Tiny nodule in the periphery of the right base is also unchanged measuring 3 mm, image 94/5. 4 mm left apical nodule is unchanged, image 20/5. Additional, small, less than 5 mm, non-solid nodules within the left lung are also stable. Upper Abdomen: Tiny low-density structure within the anterior dome of liver is unchanged measuring 4 mm. Musculoskeletal: No chest wall abnormality. No acute or significant osseous findings. IMPRESSION: 1. Stable CT of the chest status post right upper lobectomy. No findings to suggest local tumor recurrence or metastatic disease. 2. Small nonspecific pulmonary nodules are unchanged from previous exam. Continued annual CT chest surveillance advised. 3. Mild coronary artery calcifications. Electronically Signed   By: Kerby Moors M.D.   On: 06/14/2021 10:06     ASSESSMENT AND PLAN: This is a very pleasant 55 years old  white female with stage IA non-small cell lung cancer, adenocarcinoma presented with right upper lobe pulmonary nodule is status post right upper lobectomy with lymph node dissection in November 2017 under the care of Dr. Roxan Hockey. The patient has been in observation since that time and she is feeling fine today with no concerning complaints. She had repeat CT scan of the chest performed recently.  I personally and independently reviewed the scan and discussed the results with the patient today. Her scan showed no concerning findings for disease recurrence or metastasis. I recommended for the patient to continue on observation with repeat CT scan of the chest in 1 year. The patient was advised to call immediately if she has any other concerning symptoms in the interval. The patient voices understanding of current disease status and treatment options and is in agreement with the current care plan. All questions were answered. The patient knows to call the clinic with any problems, questions or concerns. We can certainly see the patient much sooner if necessary.  Disclaimer: This note was dictated with voice recognition software. Similar sounding words can inadvertently be transcribed and may not be corrected upon review.

## 2021-08-03 ENCOUNTER — Other Ambulatory Visit: Payer: Self-pay | Admitting: Family Medicine

## 2021-08-03 DIAGNOSIS — E039 Hypothyroidism, unspecified: Secondary | ICD-10-CM

## 2021-08-03 DIAGNOSIS — E559 Vitamin D deficiency, unspecified: Secondary | ICD-10-CM

## 2021-08-03 DIAGNOSIS — E7800 Pure hypercholesterolemia, unspecified: Secondary | ICD-10-CM

## 2021-08-04 DIAGNOSIS — M25511 Pain in right shoulder: Secondary | ICD-10-CM | POA: Diagnosis not present

## 2021-08-04 DIAGNOSIS — M5412 Radiculopathy, cervical region: Secondary | ICD-10-CM | POA: Diagnosis not present

## 2021-08-07 ENCOUNTER — Other Ambulatory Visit (INDEPENDENT_AMBULATORY_CARE_PROVIDER_SITE_OTHER): Payer: BC Managed Care – PPO

## 2021-08-07 ENCOUNTER — Other Ambulatory Visit: Payer: Self-pay

## 2021-08-07 DIAGNOSIS — E7800 Pure hypercholesterolemia, unspecified: Secondary | ICD-10-CM

## 2021-08-07 DIAGNOSIS — E559 Vitamin D deficiency, unspecified: Secondary | ICD-10-CM | POA: Diagnosis not present

## 2021-08-07 DIAGNOSIS — E039 Hypothyroidism, unspecified: Secondary | ICD-10-CM | POA: Diagnosis not present

## 2021-08-07 LAB — COMPREHENSIVE METABOLIC PANEL
ALT: 16 U/L (ref 0–35)
AST: 14 U/L (ref 0–37)
Albumin: 4.6 g/dL (ref 3.5–5.2)
Alkaline Phosphatase: 65 U/L (ref 39–117)
BUN: 19 mg/dL (ref 6–23)
CO2: 30 mEq/L (ref 19–32)
Calcium: 9.7 mg/dL (ref 8.4–10.5)
Chloride: 102 mEq/L (ref 96–112)
Creatinine, Ser: 0.76 mg/dL (ref 0.40–1.20)
GFR: 87.26 mL/min (ref >60.00)
Glucose, Bld: 108 mg/dL — ABNORMAL HIGH (ref 70–99)
Potassium: 4.4 mEq/L (ref 3.5–5.1)
Sodium: 140 mEq/L (ref 135–145)
Total Bilirubin: 1.1 mg/dL (ref 0.2–1.2)
Total Protein: 7.3 g/dL (ref 6.0–8.3)

## 2021-08-07 LAB — LIPID PANEL
Cholesterol: 267 mg/dL — ABNORMAL HIGH (ref 0–200)
HDL: 66.3 mg/dL (ref >39.00)
LDL Cholesterol: 182 mg/dL — ABNORMAL HIGH (ref 0–99)
NonHDL: 200.34
Total CHOL/HDL Ratio: 4
Triglycerides: 91 mg/dL (ref 0.0–149.0)
VLDL: 18.2 mg/dL (ref 0.0–40.0)

## 2021-08-07 LAB — T4, FREE: Free T4: 1.25 ng/dL (ref 0.60–1.60)

## 2021-08-07 LAB — TSH: TSH: 1.13 u[IU]/mL (ref 0.35–5.50)

## 2021-08-07 LAB — VITAMIN D 25 HYDROXY (VIT D DEFICIENCY, FRACTURES): VITD: 32.1 ng/mL (ref 30.00–100.00)

## 2021-08-14 ENCOUNTER — Encounter: Payer: BC Managed Care – PPO | Admitting: Family Medicine

## 2021-08-14 ENCOUNTER — Telehealth: Payer: Self-pay

## 2021-08-14 DIAGNOSIS — M542 Cervicalgia: Secondary | ICD-10-CM | POA: Diagnosis not present

## 2021-08-14 NOTE — Telephone Encounter (Signed)
Pt returning call to r/s today's OV.

## 2021-08-14 NOTE — Telephone Encounter (Signed)
[facility] Night - Client Nonclinical Telephone Record  [facility] Client [facility] Night - Client Client Site [place] Provider [name] - MD Contact Type Call Who Is Calling Patient / Member / Family / Caregiver Caller Name Reliance Phone Number [phone] Call Type Message Only Information Provided Reason for Call Returning a Call from the Office Initial [place] states she is returning a call Additional Comment Provided office hours Disp. Time Disposition Final User 08/14/2021 7:08:46 AM General Information Provided Yes Wynetta Emery, [place] Call Closed By: Archie Balboa Transaction Date/Time: 08/14/2021 7:06:37 AM (ET

## 2021-08-21 ENCOUNTER — Ambulatory Visit (INDEPENDENT_AMBULATORY_CARE_PROVIDER_SITE_OTHER): Payer: BC Managed Care – PPO | Admitting: Family Medicine

## 2021-08-21 ENCOUNTER — Other Ambulatory Visit: Payer: Self-pay

## 2021-08-21 ENCOUNTER — Encounter: Payer: Self-pay | Admitting: Family Medicine

## 2021-08-21 VITALS — BP 122/84 | HR 82 | Temp 97.7°F | Ht 61.5 in | Wt 194.1 lb

## 2021-08-21 DIAGNOSIS — E039 Hypothyroidism, unspecified: Secondary | ICD-10-CM

## 2021-08-21 DIAGNOSIS — E559 Vitamin D deficiency, unspecified: Secondary | ICD-10-CM

## 2021-08-21 DIAGNOSIS — Z0000 Encounter for general adult medical examination without abnormal findings: Secondary | ICD-10-CM | POA: Diagnosis not present

## 2021-08-21 DIAGNOSIS — S99921A Unspecified injury of right foot, initial encounter: Secondary | ICD-10-CM | POA: Insufficient documentation

## 2021-08-21 DIAGNOSIS — E7800 Pure hypercholesterolemia, unspecified: Secondary | ICD-10-CM

## 2021-08-21 DIAGNOSIS — E669 Obesity, unspecified: Secondary | ICD-10-CM

## 2021-08-21 DIAGNOSIS — M5412 Radiculopathy, cervical region: Secondary | ICD-10-CM | POA: Insufficient documentation

## 2021-08-21 DIAGNOSIS — I10 Essential (primary) hypertension: Secondary | ICD-10-CM

## 2021-08-21 DIAGNOSIS — Z85118 Personal history of other malignant neoplasm of bronchus and lung: Secondary | ICD-10-CM

## 2021-08-21 MED ORDER — HYDROCHLOROTHIAZIDE 25 MG PO TABS: ORAL_TABLET | ORAL | 3 refills | Status: DC

## 2021-08-21 MED ORDER — RED YEAST RICE 600 MG PO CAPS: 1.0000 | ORAL_CAPSULE | Freq: Two times a day (BID) | ORAL | Status: AC

## 2021-08-21 MED ORDER — LEVOTHYROXINE SODIUM 75 MCG PO TABS: 75.0000 ug | ORAL_TABLET | Freq: Every day | ORAL | 3 refills | Status: DC

## 2021-08-21 MED ORDER — LOSARTAN POTASSIUM 25 MG PO TABS: 25.0000 mg | ORAL_TABLET | Freq: Every day | ORAL | 3 refills | Status: DC

## 2021-08-21 MED ORDER — VITAMIN D-3 25 MCG (1000 UT) PO CAPS: 2000.0000 [IU] | ORAL_CAPSULE | Freq: Every day | ORAL | Status: DC

## 2021-08-21 NOTE — Assessment & Plan Note (Signed)
Chronic, stable on losartan and hctz with OTC potassium replacement.

## 2021-08-21 NOTE — Assessment & Plan Note (Signed)
Chronic, stable. Continue current regimen. 

## 2021-08-21 NOTE — Assessment & Plan Note (Signed)
Appreciate oncology care.

## 2021-08-21 NOTE — Assessment & Plan Note (Addendum)
Chronic, not on medication. Deteriorated control noted, reviewed with patient. No fmhx MI/CVA. Reviewed diet choices to improve cholesterol levels. She may try RYR.  The 10-year ASCVD risk score (Arnett DK, et al., 2019) is: 3.3%   Values used to calculate the score:     Age: 56 years     Sex: Female     Is Non-Hispanic African American: No     Diabetic: No     Tobacco smoker: No     Systolic Blood Pressure: 773 mmHg     Is BP treated: Yes     HDL Cholesterol: 66.3 mg/dL     Total Cholesterol: 267 mg/dL

## 2021-08-21 NOTE — Patient Instructions (Addendum)
Increase vitamin D to 2000 units daily. Cholesterol was too high - work on [employer] choices as up to now including increased fiber. Ok to try red yeast rice 600mg  twice daily.  You are doing well today. Continue current medicines.  Continue healthy diet and regular exercise routine.  Return as needed or in 1 year for next physical.  Return in 4-6 months for lab visit only (fasting labs).   Health Maintenance for Postmenopausal Women Menopause is a normal process in which your ability to get pregnant comes to an end. This process happens slowly over many months or years, usually between the ages of 82 and 11. Menopause is complete when you have missed your menstrual period for 12 months. It is important to talk with your health care provider about some of the most common conditions that affect women after menopause (postmenopausal women). These include heart disease, cancer, and bone loss (osteoporosis). Adopting a healthy lifestyle and getting preventive care can help to promote your health and wellness. The actions you take can also lower your chances of developing some of these common conditions. What are the signs and symptoms of menopause? During menopause, you may have the following symptoms: Hot flashes. These can be moderate or severe. Night sweats. Decrease in sex drive. Mood swings. Headaches. Tiredness (fatigue). Irritability. Memory problems. Problems falling asleep or staying asleep. Talk with your health care provider about treatment options for your symptoms. Do I need hormone replacement therapy? Hormone replacement therapy is effective in treating symptoms that are caused by menopause, such as hot flashes and night sweats. Hormone replacement carries certain risks, especially as you become older. If you are thinking about using estrogen or estrogen with progestin, discuss the benefits and risks with your health care provider. How can I reduce my risk for heart disease  and stroke? The risk of heart disease, heart attack, and stroke increases as you age. One of the causes may be a change in the body's hormones during menopause. This can affect how your body uses dietary fats, triglycerides, and cholesterol. Heart attack and stroke are medical emergencies. There are many things that you can do to help prevent heart disease and stroke. Watch your blood pressure High blood pressure causes heart disease and increases the risk of stroke. This is more likely to develop in people who have high blood pressure readings or are overweight. Have your blood pressure checked: Every 3-5 years if you are 78-76 years of age. Every year if you are 49 years old or older. Eat a healthy diet  Eat a diet that includes plenty of vegetables, fruits, low-fat dairy products, and lean protein. Do not eat a lot of foods that are high in solid fats, added sugars, or sodium. Get regular exercise Get regular exercise. This is one of the most important things you can do for your health. Most adults should: Try to exercise for at least 150 minutes each week. The exercise should increase your heart rate and make you sweat (moderate-intensity exercise). Try to do strengthening exercises at least twice each week. Do these in addition to the moderate-intensity exercise. Spend less time sitting. Even light physical activity can be beneficial. Other tips Work with your health care provider to achieve or maintain a healthy weight. Do not use any products that contain nicotine or tobacco. These products include cigarettes, chewing tobacco, and vaping devices, such as e-cigarettes. If you need help quitting, ask your health care provider. Know your numbers. Ask your health care  provider to check your cholesterol and your blood sugar (glucose). Continue to have your blood tested as directed by your health care provider. Do I need screening for cancer? Depending on your health history and family history,  you may need to have cancer screenings at different stages of your life. This may include screening for: Breast cancer. Cervical cancer. Lung cancer. Colorectal cancer. What is my risk for osteoporosis? After menopause, you may be at increased risk for osteoporosis. Osteoporosis is a condition in which bone destruction happens more quickly than new bone creation. To help prevent osteoporosis or the bone fractures that can happen because of osteoporosis, you may take the following actions: If you are 36-66 years old, get at least 1,000 mg of calcium and at least 600 international units (IU) of vitamin D per day. If you are older than age 50 but younger than age 30, get at least 1,200 mg of calcium and at least 600 international units (IU) of vitamin D per day. If you are older than age 44, get at least 1,200 mg of calcium and at least 800 international units (IU) of vitamin D per day. Smoking and drinking excessive alcohol increase the risk of osteoporosis. Eat foods that are rich in calcium and vitamin D, and do weight-bearing exercises several times each week as directed by your health care provider. How does menopause affect my mental health? Depression may occur at any age, but it is more common as you become older. Common symptoms of depression include: Feeling depressed. Changes in sleep patterns. Changes in appetite or eating patterns. Feeling an overall lack of motivation or enjoyment of activities that you previously enjoyed. Frequent crying spells. Talk with your health care provider if you think that you are experiencing any of these symptoms. General instructions See your health care provider for regular wellness exams and vaccines. This may include: Scheduling regular health, dental, and eye exams. Getting and maintaining your vaccines. These include: Influenza vaccine. Get this vaccine each year before the flu season begins. Pneumonia vaccine. Shingles vaccine. Tetanus,  diphtheria, and pertussis (Tdap) booster vaccine. Your health care provider may also recommend other immunizations. Tell your health care provider if you have ever been abused or do not feel safe at home. Summary Menopause is a normal process in which your ability to get pregnant comes to an end. This condition causes hot flashes, night sweats, decreased interest in sex, mood swings, headaches, or lack of sleep. Treatment for this condition may include hormone replacement therapy. Take actions to keep yourself healthy, including exercising regularly, eating a healthy diet, watching your weight, and checking your blood pressure and blood sugar levels. Get screened for cancer and depression. Make sure that you are up to date with all your vaccines. This information is not intended to replace advice given to you by your health care provider. Make sure you discuss any questions you have with your health care provider. Document Revised: 12/08/2020 Document Reviewed: 12/08/2020 Elsevier Patient Education  [place].

## 2021-08-21 NOTE — Assessment & Plan Note (Signed)
Reassuring exam, anticipate soft tissue contusion

## 2021-08-21 NOTE — Assessment & Plan Note (Signed)
Followed by ortho (M-W) - sounds like she's been referred to PM&R to consider steroid injections.

## 2021-08-21 NOTE — Assessment & Plan Note (Signed)
Chronic, low normal levels - will increase replacement to 2000 IU daily.

## 2021-08-21 NOTE — Assessment & Plan Note (Signed)
She is motivated to continue recent healthy diet and lifestyle choices for goal sustainable weight loss.

## 2021-08-21 NOTE — Assessment & Plan Note (Signed)
Preventative protocols reviewed and updated unless pt declined. Discussed healthy diet and lifestyle.  

## 2021-08-21 NOTE — Progress Notes (Signed)
Patient ID: Monica [name], female    DOB: 1966/06/27, 56 y.o.   MRN: [id number]  This visit was conducted in person.  BP 122/84    Pulse 82    Temp 97.7 F (36.5 C) (Temporal)    Ht 5' 1.5" (1.562 m)    Wt 194 lb 1 oz (88 kg)    SpO2 99%    BMI 36.07 kg/m    CC: CPE Subjective:   HPI: Monica [name] is a 56 y.o. female presenting on 08/21/2021 for Annual Exam   Stage IA lung adenocarcinoma s/p thoracoscopic RU lobectomy 06/2016 - incidentally found on CXR for sarcoidosis. Adjuvant chemo was not recommended. Followed regularly by onc Shirline Frees).  Severe cervical arthritis and bulging disc followed by Del[facility] s/p recent MRI.   Accident last night husband slipped out of bed and knee landed on her mid R foot - some bruising since. Walking fine.   Preventative: EGD 11/2019 - LA grade A reflux esophagitis, gastritis, gastric ulcer, rec PPI BID x 8 wks ([facility]) Colonoscopy 11/2019 - SSP, hemorrhoids, diverticulosis, rpt 7 yrs ([facility]) Well woman yearly with Dr Arelia Sneddon, upcoming appt 10/2021.  Mammo yearly Q6 mo alternating with breast MRI through OBGYN - Bir[facility]1 MRI 05/2021.  LMP 2010 s/p endometrial ablation, h/o polyps  BRCA gene negative Flu shot yearly  COVID vaccine Moderna 08/2019, 09/2019, booster 07/2020, 01/2021, bivalent booster 07/2021 Tdap 10/2016  Shingrix - thinks 03/2020, 06/2020  Seat belt use discussed  Sunscreen use discussed. No changing moles on skin. Sees derm regularly. 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 Interim medical history since our last visit reviewed. Allergies and medications reviewed  and updated. Outpatient Medications Prior to Visit  Medication Sig Dispense Refill   acetaminophen (TYLENOL) 325 MG tablet Take 650 mg by mouth every 6 (six) hours as needed.     albuterol (VENTOLIN HFA) 108 (90 Base) MCG/ACT inhaler Inhale 2 puffs into the lungs every 6 (six) hours as needed for wheezing or shortness of breath. 18 g 3   fluticasone (FLONASE) 50 MCG/ACT nasal spray Place 1 spray into both nostrils daily as needed for allergies. 16 g 0   gabapentin (NEURONTIN) 100 MG capsule Take 100 mg by mouth 3 (three) times daily. Takes 1 capsule twice a day     meclizine (ANTIVERT) 25 MG tablet Take 1 tablet (25 mg total) by mouth 3 (three) times daily as needed for dizziness. 30 tablet 0   pantoprazole (PROTONIX) 40 MG tablet Take 1 tablet (40 mg total) by mouth 2 (two) times daily. 90 tablet 3   Potassium 99 MG TABS Take by mouth daily.     Spacer/Aero-Holding Rudean Curt Use as directed with albuterol inhaler 1 Units 0   valACYclovir (VALTREX) 1000 MG tablet Take 2 tablets (2,000 mg total) by mouth 2 (two) times daily. For 1 day as needed for fever blister 12 tablet 1   Cholecalciferol (VITAMIN D-3) 1000 units CAPS Take 1,000 Units by mouth at bedtime.     dicyclomine (BENTYL) 10 MG capsule Take 1 capsule (10 mg total) by mouth 2 (two) times daily as needed  for spasms. 3 capsule 0   hydrochlorothiazide (HYDRODIURIL) 25 MG tablet TAKE 1 TABLET(25 MG) BY MOUTH DAILY 90 tablet 3   levothyroxine (SYNTHROID) 75 MCG tablet Take 1 tablet (75 mcg total) by mouth daily before breakfast. 90 tablet 3   losartan (COZAAR) 25 MG tablet Take 1 tablet (25 mg total) by mouth daily. 90 tablet 3   ondansetron (ZOFRAN ODT) 4 MG disintegrating tablet Take 1 tablet (4 mg total) by mouth every 8 (eight) hours as needed for nausea or vomiting. 20 tablet 0   No facility-administered medications prior to visit.     Per HPI unless specifically indicated in ROS section below Review of Systems  Constitutional:   Negative for activity change, appetite change, chills, fatigue, fever and unexpected weight change.  HENT:  Negative for hearing loss.   Eyes:  Negative for visual disturbance.  Respiratory:  Negative for cough, chest tightness, shortness of breath and wheezing.   Cardiovascular:  Negative for chest pain, palpitations and leg swelling.  Gastrointestinal:  Negative for abdominal distention, abdominal pain, blood in stool, constipation, diarrhea, nausea and vomiting.  Genitourinary:  Negative for difficulty urinating and hematuria.  Musculoskeletal:  Negative for arthralgias, myalgias and neck pain.  Skin:  Negative for rash.  Neurological:  Negative for dizziness, seizures, syncope and headaches.  Hematological:  Negative for adenopathy. Does not bruise/bleed easily.  Psychiatric/Behavioral:  Negative for dysphoric mood. The patient is not nervous/anxious.    Objective:  BP 122/84    Pulse 82    Temp 97.7 F (36.5 C) (Temporal)    Ht 5' 1.5" (1.562 m)    Wt 194 lb 1 oz (88 kg)    SpO2 99%    BMI 36.07 kg/m   Wt Readings from Last 3 Encounters:  08/21/21 194 lb 1 oz (88 kg)  06/15/21 197 lb 3.2 oz (89.4 kg)  10/03/20 180 lb (81.6 kg)      Physical Exam Vitals and nursing note reviewed.  Constitutional:      Appearance: Normal appearance. She is not ill-appearing.  HENT:     Head: Normocephalic and atraumatic.     Right Ear: Tympanic membrane, ear canal and external ear normal. There is no impacted cerumen.     Left Ear: Tympanic membrane, ear canal and external ear normal. There is no impacted cerumen.  Eyes:     General:        Right eye: No discharge.        Left eye: No discharge.     Extraocular Movements: Extraocular movements intact.     Conjunctiva/sclera: Conjunctivae normal.     Pupils: Pupils are equal, round, and reactive to light.  Neck:     Thyroid: No thyroid mass or thyromegaly.  Cardiovascular:     Rate and Rhythm: Normal rate and regular rhythm.     Pulses:  Normal pulses.     Heart sounds: Normal heart sounds. No murmur heard. Pulmonary:     Effort: Pulmonary effort is normal. No respiratory distress.     Breath sounds: Normal breath sounds. No wheezing, rhonchi or rales.  Abdominal:     General: Bowel sounds are normal. There is no distension.     Palpations: Abdomen is soft. There is no mass.     Tenderness: There is no abdominal tenderness. There is no guarding or rebound.     Hernia: No hernia is present.  Musculoskeletal:        General: Tenderness present.  Cervical back: Normal range of motion and neck supple. No rigidity.     Right lower leg: No edema.     Left lower leg: No edema.     Comments:  2+ DP bilaterally Tender to palpation dorsal R 3rd mid metatarsal shaft, no pain with axial loading, normal ambulation.   Lymphadenopathy:     Cervical: No cervical adenopathy.  Skin:    General: Skin is warm and dry.     Findings: No rash.  Neurological:     General: No focal deficit present.     Mental Status: She is alert. Mental status is at baseline.  Psychiatric:        Mood and Affect: Mood normal.        Behavior: Behavior normal.      Results for orders placed or performed in visit on 08/07/21  T4, free  Result Value Ref Range   Free T4 1.25 0.60 - 1.60 ng/dL  TSH  Result Value Ref Range   TSH 1.13 0.35 - 5.50 uIU/mL  Comprehensive metabolic panel  Result Value Ref Range   Sodium 140 135 - 145 mEq/L   Potassium 4.4 3.5 - 5.1 mEq/L   Chloride 102 96 - 112 mEq/L   CO2 30 19 - 32 mEq/L   Glucose, Bld 108 (H) 70 - 99 mg/dL   BUN 19 6 - 23 mg/dL   Creatinine, Ser 0.76 0.40 - 1.20 mg/dL   Total Bilirubin 1.1 0.2 - 1.2 mg/dL   Alkaline Phosphatase 65 39 - 117 U/L   AST 14 0 - 37 U/L   ALT 16 0 - 35 U/L   Total Protein 7.3 6.0 - 8.3 g/dL   Albumin 4.6 3.5 - 5.2 g/dL   GFR 87.26 >60.00 mL/min   Calcium 9.7 8.4 - 10.5 mg/dL  Lipid panel  Result Value Ref Range   Cholesterol 267 (H) 0 - 200 mg/dL    Triglycerides 91.0 0.0 - 149.0 mg/dL   HDL 66.30 >39.00 mg/dL   VLDL 18.2 0.0 - 40.0 mg/dL   LDL Cholesterol 182 (H) 0 - 99 mg/dL   Total CHOL/HDL Ratio 4    NonHDL 200.34   VITAMIN D 25 Hydroxy (Vit-D Deficiency, Fractures)  Result Value Ref Range   VITD 32.10 30.00 - 100.00 ng/mL    Assessment & Plan:  This visit occurred during the SARS-CoV-2 public health emergency.  Safety protocols were in place, including screening questions prior to the visit, additional usage of staff PPE, and extensive cleaning of exam room while observing appropriate contact time as indicated for disinfecting solutions.   Problem List Items Addressed This Visit     Health maintenance examination - Primary (Chronic)    Preventative protocols reviewed and updated unless pt declined. Discussed healthy diet and lifestyle.       Hypothyroidism    Chronic, stable. Continue current regimen.       Relevant Medications   levothyroxine (SYNTHROID) 75 MCG tablet   Vitamin D deficiency    Chronic, low normal levels - will increase replacement to 2000 IU daily.       Obesity, Class II, BMI 35-39.9, no comorbidity    She is motivated to continue recent healthy diet and lifestyle choices for goal sustainable weight loss.       History of adenocarcinoma of lung    Appreciate oncology care.       Hyperlipidemia    Chronic, not on medication. Deteriorated control noted, reviewed with patient. No fmhx  MI/CVA. Reviewed diet choices to improve cholesterol levels. She may try RYR.  The 10-year ASCVD risk score (Arnett DK, et al., 2019) is: 3.3%   Values used to calculate the score:     Age: 48 years     Sex: Female     Is Non-Hispanic African American: No     Diabetic: No     Tobacco smoker: No     Systolic Blood Pressure: 459 mmHg     Is BP treated: Yes     HDL Cholesterol: 66.3 mg/dL     Total Cholesterol: 267 mg/dL       Relevant Medications   hydrochlorothiazide (HYDRODIURIL) 25 MG tablet   losartan  (COZAAR) 25 MG tablet   HTN (hypertension)    Chronic, stable on losartan and hctz with OTC potassium replacement.       Relevant Medications   hydrochlorothiazide (HYDRODIURIL) 25 MG tablet   losartan (COZAAR) 25 MG tablet   Foot injury, right, initial encounter    Reassuring exam, anticipate soft tissue contusion      Cervical radiculopathy    Followed by ortho (M-W) - sounds like she's been referred to PM&R to consider steroid injections.       Relevant Medications   gabapentin (NEURONTIN) 100 MG capsule     Meds ordered this encounter  Medications   levothyroxine (SYNTHROID) 75 MCG tablet    Sig: Take 1 tablet (75 mcg total) by mouth daily before breakfast.    Dispense:  90 tablet    Refill:  3   Red Yeast Rice 600 MG CAPS    Sig: Take 1 capsule (600 mg total) by mouth in the morning and at bedtime.   Cholecalciferol (VITAMIN D-3) 25 MCG (1000 UT) CAPS    Sig: Take 2 capsules (2,000 Units total) by mouth at bedtime.    Dispense:  60 capsule   hydrochlorothiazide (HYDRODIURIL) 25 MG tablet    Sig: TAKE 1 TABLET(25 MG) BY MOUTH DAILY    Dispense:  90 tablet    Refill:  3   losartan (COZAAR) 25 MG tablet    Sig: Take 1 tablet (25 mg total) by mouth daily.    Dispense:  90 tablet    Refill:  3   No orders of the defined types were placed in this encounter.    Patient instructions: Increase vitamin D to 2000 units daily. Cholesterol was too high - work on [employer] choices as up to now including increased fiber. Ok to try red yeast rice $RemoveBe'600mg'[id number]$  twice daily.  You are doing well today. Continue current medicines.  Continue healthy diet and regular exercise routine.  Return as needed or in 1 year for next physical.  Return in 4-6 months for lab visit only (fasting labs).   Follow up plan: Return in about 1 year (around 08/21/2022) for annual exam, prior fasting for blood work.  [name], MD

## 2021-09-08 ENCOUNTER — Ambulatory Visit
Admission: EM | Admit: 2021-09-08 | Discharge: 2021-09-08 | Disposition: A | Discharge status: Home / Self Care | Payer: BC Managed Care – PPO | Attending: Emergency Medicine | Admitting: Emergency Medicine

## 2021-09-08 ENCOUNTER — Encounter: Payer: Self-pay | Admitting: Emergency Medicine

## 2021-09-08 ENCOUNTER — Other Ambulatory Visit: Payer: Self-pay

## 2021-09-08 DIAGNOSIS — R42 Dizziness and giddiness: Secondary | ICD-10-CM

## 2021-09-08 MED ORDER — MECLIZINE HCL 25 MG PO TABS: 25.0000 mg | ORAL_TABLET | Freq: Three times a day (TID) | ORAL | 0 refills | Status: DC | PRN

## 2021-09-08 NOTE — ED Provider Notes (Addendum)
[name]    CSN: [id number] Arrival date & time: 09/08/21  1234      History   Chief Complaint Chief Complaint  Patient presents with   Dizziness    HPI Monica [name] is a 56 y.o. female.  Patient presents with sensation of lightheadedness since yesterday morning when she woke up.  No falls or injury.  She describes this as feeling off-balance.  She denies sensation of room spinning or self spinning.  She denies ear pain, headache, weakness, numbness, chest pain, shortness of breath, nausea, vomiting, or other symptoms.  Treatment at home with meclizine that was prescribed a year ago for vertigo.  Her medical history includes vertigo, hypertension, sarcoidosis, adenocarcinoma of lung.  Patient requests referral to an ENT.    The history is provided by the patient and medical records.   Past Medical History:  Diagnosis Date   Allergy    seasonal   Essential hypertension    History of chicken pox    Hyperbilirubinemia 01/19/2017   Hyperlipidemia    Hypothyroidism    Primary adenocarcinoma of upper lobe of right lung ([place]) 05/28/2016   RUL apex spiculated ~2cm nodule concerning by PET scan - referred to multidisciplinary thoracic oncology clinic S/p thoracoscopic RULobectomy Roxan Hockey) 06/2016   Sarcoidosis, lung ([place]) ~2004   treated with prendisone and stable since then    Patient Active Problem List   Diagnosis Date Noted   Foot injury, right, initial encounter 08/21/2021   Cervical radiculopathy 08/21/2021   Vertigo 09/22/2020   Adenocarcinoma of right lung, stage 1 ([place]) 06/16/2020   RUQ abdominal pain 11/17/2018   Rosanna Randy disease 01/19/2017   HTN (hypertension) 12/30/2016   Hyperlipidemia 11/19/2016   Fever blister 11/19/2016   Chronic cough 08/17/2016   ETD (Eustachian tube dysfunction), right 08/17/2016   Globus sensation 07/09/2016   History of adenocarcinoma of lung 05/28/2016   Bilateral carpal tunnel syndrome 05/28/2016   Primary  osteoarthritis of first carpometacarpal joint of right hand 05/28/2016   Trigger finger of right thumb 05/28/2016   Health maintenance examination 04/01/2015   Vitamin D deficiency 04/01/2015   Obesity, Class II, BMI 35-39.9, no comorbidity 04/01/2015   History of sarcoidosis 10/10/2014   Hypothyroidism 10/10/2014    Past Surgical History:  Procedure Laterality Date   CARDIOVASCULAR STRESS TEST  12/2016   Nuclear CP stress test: hypertensive response to exercise, normal low risk study, EF 68%   COLONOSCOPY  09/2015   WNL [name])   COLONOSCOPY  11/2019   SSP, hemorrhoids, diverticulosis, rpt 7 yrs ([place])   ENDOMETRIAL ABLATION W/ NOVASURE  01/2009   Archie Endo 12/02/2010   ESOPHAGOGASTRODUODENOSCOPY  11/2019   LA Grade A reflux esophagitis, gastric ulcer, gastritis, HH ([place])   LOBECTOMY Right 06/14/2016   RU Lobectomy; Surgeon: Melrose Nakayama, MD   LYMPH NODE DISSECTION Right 06/14/2016   RIGHT UPPER LOBE LYMPH NODE DISSECTION;  Surgeon: Melrose Nakayama, MD   REFRACTIVE SURGERY Bilateral 1998   TUBAL LIGATION  2001   VIDEO ASSISTED THORACOSCOPY (VATS)/WEDGE RESECTION Right 06/14/2016   Surgeon: Melrose Nakayama, MD    OB History   No obstetric history on file.      Home Medications    Prior to Admission medications   Medication Sig Start Date End Date Taking? Authorizing Provider  meclizine (ANTIVERT) 25 MG tablet Take 1 tablet (25 mg total) by mouth 3 (three) times daily as needed for dizziness. 09/08/21  Yes Sharion Balloon, NP  acetaminophen (  TYLENOL) 325 MG tablet Take 650 mg by mouth every 6 (six) hours as needed.    [provider]  albuterol (VENTOLIN HFA) 108 (90 Base) MCG/ACT inhaler Inhale 2 puffs into the lungs every 6 (six) hours as needed for wheezing or shortness of breath. 06/08/19   [name], MD  Cholecalciferol (VITAMIN D-3) 25 MCG (1000 UT) CAPS Take 2 capsules (2,000 Units total) by mouth at bedtime. 08/21/21   [name], MD  fluticasone [facility]) 50 MCG/ACT nasal spray Place 1 spray into both nostrils daily as needed for allergies. 12/31/16   Mikhail, Velta Addison, DO  gabapentin (NEURONTIN) 100 MG capsule Take 100 mg by mouth 3 (three) times daily. Takes 1 capsule twice a day    [provider]  hydrochlorothiazide (HYDRODIURIL) 25 MG tablet TAKE 1 TABLET(25 MG) BY MOUTH DAILY 08/21/21   [name], MD  levothyroxine (SYNTHROID) 75 MCG tablet Take 1 tablet (75 mcg total) by mouth daily before breakfast. 08/21/21   [name], MD  losartan (COZAAR) 25 MG tablet Take 1 tablet (25 mg total) by mouth daily. 08/21/21   [name], MD  pantoprazole (PROTONIX) 40 MG tablet Take 1 tablet (40 mg total) by mouth 2 (two) times daily. 11/23/19   Thornton Park, MD  Potassium 99 MG TABS Take by mouth daily.    [provider]  Red Yeast Rice 600 MG CAPS Take 1 capsule (600 mg total) by mouth in the morning and at bedtime. 08/21/21   [name], MD  Spacer/Aero-Holding Josiah Lobo DEVI Use as directed with albuterol inhaler 06/08/19   [name], MD  valACYclovir (VALTREX) 1000 MG tablet Take 2 tablets (2,000 mg total) by mouth 2 (two) times daily. For 1 day as needed for fever blister 07/31/20   [name], MD    Family History Family History  Problem Relation Age of Onset   Hypertension Mother    Breast cancer Mother    Prostate cancer Father 70   CAD Maternal Grandmother        MI   Stroke Maternal Grandmother    Stroke Paternal Grandmother    CAD Paternal Grandmother    Diabetes Paternal Grandfather    Colon cancer Neg Hx    Liver cancer Neg Hx    Colon polyps Neg Hx    Esophageal cancer Neg Hx    Rectal cancer Neg Hx    Stomach cancer Neg Hx     Social History Social History   Tobacco Use   Smoking status: Never   Smokeless tobacco: Never  Vaping Use   Vaping Use: Never used  Substance Use Topics   Alcohol use: No    Alcohol/week: 0.0 standard  drinks   Drug use: No     Allergies   Patient has no known allergies.   Review of Systems Review of Systems  Constitutional:  Negative for chills and fever.  HENT:  Negative for ear discharge, ear pain, hearing loss and sore throat.   Respiratory:  Negative for cough and shortness of breath.   Cardiovascular:  Negative for chest pain and palpitations.  Gastrointestinal:  Negative for abdominal pain, nausea and vomiting.  Skin:  Negative for color change and rash.  Neurological:  Positive for light-headedness. Negative for syncope, facial asymmetry, speech difficulty, weakness, numbness and headaches.  All other systems reviewed and are negative.   Physical Exam Triage Vital Signs ED Triage Vitals  Enc Vitals Group     BP 09/08/21 1245 139/87  Pulse Rate 09/08/21 1245 91     Resp 09/08/21 1245 18     Temp 09/08/21 1245 98.2 F (36.8 C)     Temp src --      SpO2 09/08/21 1245 97 %     Weight --      Height --      Head Circumference --      Peak Flow --      Pain Score 09/08/21 1250 0     Pain Loc --      Pain Edu? --      Excl. in [place]? --    No data found.  Updated Vital Signs BP 139/87    Pulse 91    Temp 98.2 F (36.8 C)    Resp 18    SpO2 97%   Visual Acuity Right Eye Distance:   Left Eye Distance:   Bilateral Distance:    Right Eye Near:   Left Eye Near:    Bilateral Near:     Physical Exam Vitals and nursing note reviewed.  Constitutional:      General: She is not in acute distress.    Appearance: Normal appearance. She is well-developed. She is not ill-appearing.  HENT:     Right Ear: Tympanic membrane normal.     Left Ear: Tympanic membrane normal.     Nose: Nose normal.     Mouth/Throat:     Mouth: Mucous membranes are moist.     Pharynx: Oropharynx is clear.  Cardiovascular:     Rate and Rhythm: Normal rate and regular rhythm.     Heart sounds: Normal heart sounds.  Pulmonary:     Effort: Pulmonary effort is normal. No respiratory  distress.     Breath sounds: Normal breath sounds.  Abdominal:     Palpations: Abdomen is soft.     Tenderness: There is no abdominal tenderness.  Musculoskeletal:     Cervical back: Neck supple.     Right lower leg: No edema.     Left lower leg: No edema.  Skin:    General: Skin is warm and dry.  Neurological:     General: No focal deficit present.     Mental Status: She is alert and oriented to person, place, and time.     Cranial Nerves: No cranial nerve deficit.     Sensory: No sensory deficit.     Motor: No weakness.     Coordination: Romberg sign negative.     Gait: Gait normal.  Psychiatric:        Mood and Affect: Mood normal.        Behavior: Behavior normal.     UC Treatments / Results  Labs (all labs ordered are listed, but only abnormal results are displayed) Labs Reviewed - No data to display  EKG   Radiology No results found.  Procedures Procedures (including critical care time)  Medications Ordered in UC Medications - No data to display  Initial Impression / Assessment and Plan / UC Course  I have reviewed the triage vital signs and the nursing notes.  Pertinent labs & imaging results that were available during my care of the patient were reviewed by me and considered in my medical decision making (see chart for details).   Dizziness.  Patient is well-appearing and her exam is reassuring.  She is accompanied by her husband.  She declines EKG.  Treating with new prescription for meclizine and precautions for drowsiness with this medication discussed.  Instructed patient to follow up with her PCP as soon as possible; preferably tomorrow.  Discussed the need to obtain referral to ENT from her PCP.  ED precautions discussed.  She agrees to plan of care.     Final Clinical Impressions(s) / UC Diagnoses   Final diagnoses:  Dizziness     Discharge Instructions      Take the meclizine as directed.  Do not drive, operate machinery, or drink alcohol  with this medication as it may cause drowsiness.  Follow-up with your primary care provider soon as possible.  Follow-up with an ENT if you feel this will be beneficial.    Go to the emergency department if you have acute worsening symptoms.         ED Prescriptions     Medication Sig Dispense Auth. Provider   meclizine (ANTIVERT) 25 MG tablet Take 1 tablet (25 mg total) by mouth 3 (three) times daily as needed for dizziness. 30 tablet Sharion Balloon, NP      PDMP not reviewed this encounter.   Sharion Balloon, NP 09/08/21 1341    Sharion Balloon, NP 09/08/21 1343

## 2021-09-08 NOTE — Discharge Instructions (Addendum)
Take the meclizine as directed.  Do not drive, operate machinery, or drink alcohol with this medication as it may cause drowsiness.  Follow-up with your primary care provider soon as possible.  Follow-up with an ENT if you feel this will be beneficial.    Go to the emergency department if you have acute worsening symptoms.

## 2021-09-08 NOTE — ED Triage Notes (Signed)
Pt has hx of vertigo and was see for this almost exactly a year ago. States she began experiencing dizziness and room spinning yesterday.

## 2021-09-10 ENCOUNTER — Encounter: Payer: Self-pay | Admitting: Family Medicine

## 2021-09-10 DIAGNOSIS — R42 Dizziness and giddiness: Secondary | ICD-10-CM

## 2021-09-10 NOTE — Telephone Encounter (Signed)
ENT referral placed. Plz forward to [facility] ENT and pt will call to schedule appt. Thanks.

## 2021-09-11 NOTE — Telephone Encounter (Signed)
Noted  

## 2021-09-14 DIAGNOSIS — M5412 Radiculopathy, cervical region: Secondary | ICD-10-CM | POA: Diagnosis not present

## 2021-12-07 DIAGNOSIS — Z1231 Encounter for screening mammogram for malignant neoplasm of breast: Secondary | ICD-10-CM | POA: Diagnosis not present

## 2022-02-18 ENCOUNTER — Other Ambulatory Visit: Payer: Self-pay | Admitting: Family Medicine

## 2022-02-18 DIAGNOSIS — E7800 Pure hypercholesterolemia, unspecified: Secondary | ICD-10-CM

## 2022-02-18 DIAGNOSIS — R739 Hyperglycemia, unspecified: Secondary | ICD-10-CM

## 2022-02-19 ENCOUNTER — Other Ambulatory Visit (INDEPENDENT_AMBULATORY_CARE_PROVIDER_SITE_OTHER): Payer: BC Managed Care – PPO

## 2022-02-19 DIAGNOSIS — E7800 Pure hypercholesterolemia, unspecified: Secondary | ICD-10-CM

## 2022-02-19 DIAGNOSIS — R739 Hyperglycemia, unspecified: Secondary | ICD-10-CM | POA: Diagnosis not present

## 2022-02-19 LAB — BASIC METABOLIC PANEL
BUN: 13 mg/dL (ref 6–23)
CO2: 29 mEq/L (ref 19–32)
Calcium: 9.2 mg/dL (ref 8.4–10.5)
Chloride: 99 mEq/L (ref 96–112)
Creatinine, Ser: 0.66 mg/dL (ref 0.40–1.20)
GFR: 97.32 mL/min (ref >60.00)
Glucose, Bld: 103 mg/dL — ABNORMAL HIGH (ref 70–99)
Potassium: 4 mEq/L (ref 3.5–5.1)
Sodium: 138 mEq/L (ref 135–145)

## 2022-02-19 LAB — LIPID PANEL
Cholesterol: 225 mg/dL — ABNORMAL HIGH (ref 0–200)
HDL: 56.1 mg/dL (ref >39.00)
LDL Cholesterol: 148 mg/dL — ABNORMAL HIGH (ref 0–99)
NonHDL: 168.73
Total CHOL/HDL Ratio: 4
Triglycerides: 103 mg/dL (ref 0.0–149.0)
VLDL: 20.6 mg/dL (ref 0.0–40.0)

## 2022-02-20 LAB — APOLIPOPROTEIN B: Apolipoprotein B: 114 mg/dL — ABNORMAL HIGH (ref <90)

## 2022-02-23 ENCOUNTER — Encounter: Payer: Self-pay | Admitting: Family Medicine

## 2022-03-01 DIAGNOSIS — Z1151 Encounter for screening for human papillomavirus (HPV): Secondary | ICD-10-CM | POA: Diagnosis not present

## 2022-03-01 DIAGNOSIS — Z6836 Body mass index (BMI) 36.0-36.9, adult: Secondary | ICD-10-CM | POA: Diagnosis not present

## 2022-03-01 DIAGNOSIS — Z01419 Encounter for gynecological examination (general) (routine) without abnormal findings: Secondary | ICD-10-CM | POA: Diagnosis not present

## 2022-03-01 DIAGNOSIS — Z124 Encounter for screening for malignant neoplasm of cervix: Secondary | ICD-10-CM | POA: Diagnosis not present

## 2022-03-08 ENCOUNTER — Other Ambulatory Visit: Payer: Self-pay | Admitting: Obstetrics and Gynecology

## 2022-03-08 DIAGNOSIS — Z803 Family history of malignant neoplasm of breast: Secondary | ICD-10-CM

## 2022-06-04 ENCOUNTER — Other Ambulatory Visit: Payer: BC Managed Care – PPO

## 2022-06-11 ENCOUNTER — Other Ambulatory Visit: Payer: Self-pay

## 2022-06-11 ENCOUNTER — Inpatient Hospital Stay: Payer: BC Managed Care – PPO | Attending: Internal Medicine

## 2022-06-11 ENCOUNTER — Ambulatory Visit (HOSPITAL_COMMUNITY)
Admission: RE | Admit: 2022-06-11 | Discharge: 2022-06-11 | Disposition: A | Discharge status: Home / Self Care | Payer: BC Managed Care – PPO | Source: Ambulatory Visit | Attending: Internal Medicine | Admitting: Internal Medicine

## 2022-06-11 ENCOUNTER — Encounter (HOSPITAL_COMMUNITY): Payer: Self-pay

## 2022-06-11 DIAGNOSIS — C3490 Malignant neoplasm of unspecified part of unspecified bronchus or lung: Secondary | ICD-10-CM | POA: Insufficient documentation

## 2022-06-11 DIAGNOSIS — R918 Other nonspecific abnormal finding of lung field: Secondary | ICD-10-CM | POA: Diagnosis not present

## 2022-06-11 DIAGNOSIS — Z85118 Personal history of other malignant neoplasm of bronchus and lung: Secondary | ICD-10-CM | POA: Insufficient documentation

## 2022-06-11 DIAGNOSIS — Z79899 Other long term (current) drug therapy: Secondary | ICD-10-CM | POA: Insufficient documentation

## 2022-06-11 LAB — CBC WITH DIFFERENTIAL (CANCER CENTER ONLY)
Abs Immature Granulocytes: 0.02 10*3/uL (ref 0.00–0.07)
Basophils Absolute: 0.1 10*3/uL (ref 0.0–0.1)
Basophils Relative: 1 %
Eosinophils Absolute: 0.1 10*3/uL (ref 0.0–0.5)
Eosinophils Relative: 2 %
HCT: 38.5 % (ref 36.0–46.0)
Hemoglobin: 13.3 g/dL (ref 12.0–15.0)
Immature Granulocytes: 0 %
Lymphocytes Relative: 25 %
Lymphs Abs: 2.1 10*3/uL (ref 0.7–4.0)
MCH: 27.5 pg (ref 26.0–34.0)
MCHC: 34.5 g/dL (ref 30.0–36.0)
MCV: 79.7 fL — ABNORMAL LOW (ref 80.0–100.0)
Monocytes Absolute: 0.4 10*3/uL (ref 0.1–1.0)
Monocytes Relative: 5 %
Neutro Abs: 5.5 10*3/uL (ref 1.7–7.7)
Neutrophils Relative %: 67 %
Platelet Count: 301 10*3/uL (ref 150–400)
RBC: 4.83 MIL/uL (ref 3.87–5.11)
RDW: 13.3 % (ref 11.5–15.5)
WBC Count: 8.2 10*3/uL (ref 4.0–10.5)
nRBC: 0 % (ref 0.0–0.2)

## 2022-06-11 LAB — CMP (CANCER CENTER ONLY)
ALT: 13 U/L (ref 0–44)
AST: 12 U/L — ABNORMAL LOW (ref 15–41)
Albumin: 4.2 g/dL (ref 3.5–5.0)
Alkaline Phosphatase: 68 U/L (ref 38–126)
Anion gap: 8 (ref 5–15)
BUN: 15 mg/dL (ref 6–20)
CO2: 29 mmol/L (ref 22–32)
Calcium: 9.3 mg/dL (ref 8.9–10.3)
Chloride: 101 mmol/L (ref 98–111)
Creatinine: 0.68 mg/dL (ref 0.44–1.00)
GFR, Estimated: >60 mL/min (ref >60)
Glucose, Bld: 98 mg/dL (ref 70–99)
Potassium: 3.2 mmol/L — ABNORMAL LOW (ref 3.5–5.1)
Sodium: 138 mmol/L (ref 135–145)
Total Bilirubin: 1.5 mg/dL — ABNORMAL HIGH (ref 0.3–1.2)
Total Protein: 7.4 g/dL (ref 6.5–8.1)

## 2022-06-11 MED ORDER — SODIUM CHLORIDE (PF) 0.9 % IJ SOLN
INTRAMUSCULAR | Status: AC
  Filled 2022-06-11: qty 50

## 2022-06-11 MED ORDER — IOHEXOL 300 MG/ML  SOLN
75.0000 mL | Freq: Once | INTRAMUSCULAR | Status: AC | PRN
  Administered 2022-06-11: 75 mL via INTRAVENOUS

## 2022-06-14 ENCOUNTER — Inpatient Hospital Stay (HOSPITAL_BASED_OUTPATIENT_CLINIC_OR_DEPARTMENT_OTHER): Payer: BC Managed Care – PPO | Admitting: Internal Medicine

## 2022-06-14 ENCOUNTER — Other Ambulatory Visit: Payer: Self-pay

## 2022-06-14 VITALS — BP 119/88 | HR 75 | Temp 98.2°F | Resp 16 | Wt 202.1 lb

## 2022-06-14 DIAGNOSIS — Z79899 Other long term (current) drug therapy: Secondary | ICD-10-CM | POA: Diagnosis not present

## 2022-06-14 DIAGNOSIS — C3490 Malignant neoplasm of unspecified part of unspecified bronchus or lung: Secondary | ICD-10-CM | POA: Diagnosis not present

## 2022-06-14 DIAGNOSIS — Z85118 Personal history of other malignant neoplasm of bronchus and lung: Secondary | ICD-10-CM | POA: Diagnosis not present

## 2022-06-14 NOTE — Addendum Note (Signed)
Addended by: Ardeen Garland on: 06/14/2022 10:49 AM   Modules accepted: Orders

## 2022-06-14 NOTE — Progress Notes (Signed)
[place] [phone]   [phone]  OFFICE PROGRESS NOTE  [name], MD [place] [address]  DIAGNOSIS: Stage IA (T1a, N0, M0) non-small cell lung cancer, adenocarcinoma presented with right upper lobe pulmonary nodule diagnosed in November 2017.  PRIOR THERAPY:status post right upper lobectomy with lymph node dissection in November 2017 under the care of Dr. Roxan Hockey.  CURRENT THERAPY: Observation.  INTERVAL HISTORY: Monica [name] 56 y.o. female returns to the clinic today for annual follow-up visit.  The patient is feeling fine today with no concerning complaints.  She denied having any current chest pain, shortness of breath, cough or hemoptysis.  She has no nausea, vomiting, diarrhea or constipation.  She has no headache or visual changes.  She denied having any weight loss or night sweats.  She had repeat CT scan of the chest performed recently and she is here for evaluation and discussion of her scan results.  MEDICAL HISTORY: Past Medical History:  Diagnosis Date   Allergy    seasonal   Essential hypertension    History of chicken pox    Hyperbilirubinemia 01/19/2017   Hyperlipidemia    Hypothyroidism    Primary adenocarcinoma of upper lobe of right lung ([place]) 05/28/2016   RUL apex spiculated ~2cm nodule concerning by PET scan - referred to multidisciplinary thoracic oncology clinic S/p thoracoscopic RULobectomy Roxan Hockey) 06/2016   Sarcoidosis, lung ([place]) ~2004   treated with prendisone and stable since then    ALLERGIES:  has No Known Allergies.  MEDICATIONS:  Current Outpatient Medications  Medication Sig Dispense Refill   acetaminophen (TYLENOL) 325 MG tablet Take 650 mg by mouth every 6 (six) hours as needed.     albuterol (VENTOLIN HFA) 108 (90 Base) MCG/ACT inhaler Inhale 2 puffs into the lungs every 6 (six) hours as needed for wheezing or shortness of breath. 18 g 3    Cholecalciferol (VITAMIN D-3) 25 MCG (1000 UT) CAPS Take 2 capsules (2,000 Units total) by mouth at bedtime. 60 capsule    fluticasone (FLONASE) 50 MCG/ACT nasal spray Place 1 spray into both nostrils daily as needed for allergies. 16 g 0   gabapentin (NEURONTIN) 100 MG capsule Take 100 mg by mouth 3 (three) times daily. Takes 1 capsule twice a day     hydrochlorothiazide (HYDRODIURIL) 25 MG tablet TAKE 1 TABLET(25 MG) BY MOUTH DAILY 90 tablet 3   levothyroxine (SYNTHROID) 75 MCG tablet Take 1 tablet (75 mcg total) by mouth daily before breakfast. 90 tablet 3   losartan (COZAAR) 25 MG tablet Take 1 tablet (25 mg total) by mouth daily. 90 tablet 3   meclizine (ANTIVERT) 25 MG tablet Take 1 tablet (25 mg total) by mouth 3 (three) times daily as needed for dizziness. 30 tablet 0   pantoprazole (PROTONIX) 40 MG tablet Take 1 tablet (40 mg total) by mouth 2 (two) times daily. 90 tablet 3   Potassium 99 MG TABS Take by mouth daily.     Red Yeast Rice 600 MG CAPS Take 1 capsule (600 mg total) by mouth in the morning and at bedtime.     Spacer/Aero-Holding Dorise Bullion Use as directed with albuterol inhaler 1 Units 0   valACYclovir (VALTREX) 1000 MG tablet Take 2 tablets (2,000 mg total) by mouth 2 (two) times daily. For 1 day as needed for fever blister 12 tablet 1   No current facility-administered medications for this visit.    SURGICAL HISTORY:  Past Surgical History:  Procedure Laterality Date   CARDIOVASCULAR STRESS TEST  12/2016   Nuclear CP stress test: hypertensive response to exercise, normal low risk study, EF 68%   COLONOSCOPY  09/2015   WNL [name])   COLONOSCOPY  11/2019   SSP, hemorrhoids, diverticulosis, rpt 7 yrs ([place])   ENDOMETRIAL ABLATION W/ NOVASURE  01/2009   Archie Endo 12/02/2010   ESOPHAGOGASTRODUODENOSCOPY  11/2019   LA Grade A reflux esophagitis, gastric ulcer, gastritis, HH ([place])   LOBECTOMY Right 06/14/2016   RU Lobectomy; Surgeon: Melrose Nakayama, MD    LYMPH NODE DISSECTION Right 06/14/2016   RIGHT UPPER LOBE LYMPH NODE DISSECTION;  Surgeon: Melrose Nakayama, MD   REFRACTIVE SURGERY Bilateral [place] (VATS)/WEDGE RESECTION Right 06/14/2016   Surgeon: Melrose Nakayama, MD    REVIEW OF SYSTEMS:  A comprehensive review of systems was negative.   PHYSICAL EXAMINATION: General appearance: alert, cooperative, and no distress Head: Normocephalic, without obvious abnormality, atraumatic Neck: no adenopathy, no JVD, supple, symmetrical, trachea midline, and thyroid not enlarged, symmetric, no tenderness/mass/nodules Lymph nodes: Cervical, supraclavicular, and axillary nodes normal. Resp: clear to auscultation bilaterally Back: symmetric, no curvature. ROM normal. No CVA tenderness. Cardio: regular rate and rhythm, S1, S2 normal, no murmur, click, rub or gallop GI: soft, non-tender; bowel sounds normal; no masses,  no organomegaly Extremities: extremities normal, atraumatic, no cyanosis or edema  ECOG PERFORMANCE STATUS: 0 - Asymptomatic  Blood pressure 119/88, pulse 75, temperature 98.2 F (36.8 C), temperature source Oral, resp. rate 16, weight 202 lb 2 oz (91.7 kg), SpO2 98 %.  LABORATORY DATA: Lab Results  Component Value Date   WBC 8.2 06/11/2022   HGB 13.3 06/11/2022   HCT 38.5 06/11/2022   MCV 79.7 (L) 06/11/2022   PLT 301 06/11/2022      Chemistry      Component Value Date/Time   NA 138 06/11/2022 0957   NA 140 07/20/2017 1338   K 3.2 (L) 06/11/2022 0957   K 4.1 07/20/2017 1338   CL 101 06/11/2022 0957   CO2 29 06/11/2022 0957   CO2 26 07/20/2017 1338   BUN 15 06/11/2022 0957   BUN 13.4 07/20/2017 1338   CREATININE 0.68 06/11/2022 0957   CREATININE 0.8 07/20/2017 1338      Component Value Date/Time   CALCIUM 9.3 06/11/2022 0957   CALCIUM 9.7 07/20/2017 1338   ALKPHOS 68 06/11/2022 0957   ALKPHOS 77 07/20/2017 1338   AST 12 (L) 06/11/2022 0957   AST 15  07/20/2017 1338   ALT 13 06/11/2022 0957   ALT 12 07/20/2017 1338   BILITOT 1.5 (H) 06/11/2022 0957   BILITOT 1.71 (H) 07/20/2017 1338       RADIOGRAPHIC STUDIES: No results found.   ASSESSMENT AND PLAN: This is a very pleasant 56 years old white female with stage IA non-small cell lung cancer, adenocarcinoma presented with right upper lobe pulmonary nodule is status post right upper lobectomy with lymph node dissection in November 2017 under the care of Dr. Roxan Hockey. The patient is currently on observation and she is feeling fine with no concerning complaints. She had repeat CT scan of the chest performed recently.  The final report is still pending.  I personally and independently reviewed the scan images and discussed the result with the patient.  I did not see any concerning findings for recurrence or metastasis but I will wait for the final report for confirmation.  I have the final report showed no concerning finding for disease recurrence or metastasis, I will see her back for follow-up visit in 1 year for evaluation and repeat CT scan of the chest. The patient was advised to call immediately if she has any other concerning symptoms in the interval. The patient voices understanding of current disease status and treatment options and is in agreement with the current care plan. All questions were answered. The patient knows to call the clinic with any problems, questions or concerns. We can certainly see the patient much sooner if necessary.  Disclaimer: This note was dictated with voice recognition software. Similar sounding words can inadvertently be transcribed and may not be corrected upon review.

## 2022-06-18 ENCOUNTER — Encounter: Payer: Self-pay | Admitting: Internal Medicine

## 2022-06-18 ENCOUNTER — Ambulatory Visit: Payer: BC Managed Care – PPO | Admitting: Internal Medicine

## 2022-06-18 VITALS — BP 104/70 | HR 74 | Temp 97.9°F | Ht 61.5 in | Wt 199.0 lb

## 2022-06-18 DIAGNOSIS — J019 Acute sinusitis, unspecified: Secondary | ICD-10-CM | POA: Diagnosis not present

## 2022-06-18 MED ORDER — AMOXICILLIN 500 MG PO TABS: 1000.0000 mg | ORAL_TABLET | Freq: Two times a day (BID) | ORAL | 0 refills | Status: AC

## 2022-06-18 MED ORDER — BENZONATATE 200 MG PO CAPS: 200.0000 mg | ORAL_CAPSULE | Freq: Three times a day (TID) | ORAL | 0 refills | Status: DC | PRN

## 2022-06-18 NOTE — Assessment & Plan Note (Signed)
Sick for 3 weeks and not improving No pulmonary symptoms Will try amoxil 1000 bid x 7 days Benzonatate prn

## 2022-06-18 NOTE — Progress Notes (Signed)
Subjective:    Patient ID: Monica [name], female    DOB: 1966/01/15, 56 y.o.   MRN: [id number]  HPI Here due to persistent cough  For 3 weeks, she has had cough Awoke with drainage, sore throat and cough Sore throat went away--but still gets mucus in back of throat Coughs up clear stuff Some right ear pain--1 day  Delsym and mucinex not really helping  No fever No SOB Slight right temporal headache --related to giving up caffeine  Current Outpatient Medications on File Prior to Visit  Medication Sig Dispense Refill   acetaminophen (TYLENOL) 325 MG tablet Take 650 mg by mouth every 6 (six) hours as needed.     albuterol (VENTOLIN HFA) 108 (90 Base) MCG/ACT inhaler Inhale 2 puffs into the lungs every 6 (six) hours as needed for wheezing or shortness of breath. 18 g 3   Cholecalciferol (VITAMIN D-3) 25 MCG (1000 UT) CAPS Take 2 capsules (2,000 Units total) by mouth at bedtime. 60 capsule    fluticasone (FLONASE) 50 MCG/ACT nasal spray Place 1 spray into both nostrils daily as needed for allergies. 16 g 0   hydrochlorothiazide (HYDRODIURIL) 25 MG tablet TAKE 1 TABLET(25 MG) BY MOUTH DAILY 90 tablet 3   levothyroxine (SYNTHROID) 75 MCG tablet Take 1 tablet (75 mcg total) by mouth daily before breakfast. 90 tablet 3   losartan (COZAAR) 25 MG tablet Take 1 tablet (25 mg total) by mouth daily. 90 tablet 3   Potassium 99 MG TABS Take by mouth daily.     Red Yeast Rice 600 MG CAPS Take 1 capsule (600 mg total) by mouth in the morning and at bedtime.     Spacer/Aero-Holding Dorise Bullion Use as directed with albuterol inhaler 1 Units 0   valACYclovir (VALTREX) 1000 MG tablet Take 2 tablets (2,000 mg total) by mouth 2 (two) times daily. For 1 day as needed for fever blister 12 tablet 1   No current facility-administered medications on file prior to visit.    No Known Allergies  Past Medical History:  Diagnosis Date   Allergy    seasonal   Essential hypertension    History of  chicken pox    Hyperbilirubinemia 01/19/2017   Hyperlipidemia    Hypothyroidism    Primary adenocarcinoma of upper lobe of right lung ([place]) 05/28/2016   RUL apex spiculated ~2cm nodule concerning by PET scan - referred to multidisciplinary thoracic oncology clinic S/p thoracoscopic RULobectomy Roxan Hockey) 06/2016   Sarcoidosis, lung ([place]) ~2004   treated with prendisone and stable since then    Past Surgical History:  Procedure Laterality Date   CARDIOVASCULAR STRESS TEST  12/2016   Nuclear CP stress test: hypertensive response to exercise, normal low risk study, EF 68%   COLONOSCOPY  09/2015   WNL [name])   COLONOSCOPY  11/2019   SSP, hemorrhoids, diverticulosis, rpt 7 yrs ([place])   ENDOMETRIAL ABLATION W/ NOVASURE  01/2009   Archie Endo 12/02/2010   ESOPHAGOGASTRODUODENOSCOPY  11/2019   LA Grade A reflux esophagitis, gastric ulcer, gastritis, HH ([place])   LOBECTOMY Right 06/14/2016   RU Lobectomy; Surgeon: Melrose Nakayama, MD   LYMPH NODE DISSECTION Right 06/14/2016   RIGHT UPPER LOBE LYMPH NODE DISSECTION;  Surgeon: Melrose Nakayama, MD   REFRACTIVE SURGERY Bilateral 1998   TUBAL LIGATION  2001   VIDEO ASSISTED THORACOSCOPY (VATS)/WEDGE RESECTION Right 06/14/2016   Surgeon: Melrose Nakayama, MD    Family History  Problem Relation Age of Onset   Hypertension  Mother    Breast cancer Mother    Prostate cancer Father 33   CAD Maternal Grandmother        MI   Stroke Maternal Grandmother    Stroke Paternal Grandmother    CAD Paternal Grandmother    Diabetes Paternal Grandfather    Colon cancer Neg Hx    Liver cancer Neg Hx    Colon polyps Neg Hx    Esophageal cancer Neg Hx    Rectal cancer Neg Hx    Stomach cancer Neg Hx     Social History   Socioeconomic History   Marital status: Married    Spouse name: Not on file   Number of children: 1   Years of education: Not on file   Highest education level: Not on file  Occupational History   Occupation:  Recruitment consultant  Tobacco Use   Smoking status: Never   Smokeless tobacco: Never  Vaping Use   Vaping Use: Never used  Substance and Sexual Activity   Alcohol use: No    Alcohol/week: 0.0 standard drinks of alcohol   Drug use: No   Sexual activity: Not Currently  Other Topics Concern   Not on file  Social History Narrative   Lives with husband (disabled) and son, 1 cat and dog   Occupation: Psychologist, 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  HENT:     Head:     Comments: No sinus tenderness    Right Ear: Tympanic membrane and ear canal normal.     Left Ear: Tympanic membrane and ear canal normal.     Nose: No congestion.     Mouth/Throat:     Pharynx: No oropharyngeal exudate or posterior oropharyngeal erythema.  Pulmonary:     Effort: Pulmonary effort is normal.     Breath sounds: Normal breath sounds. No wheezing or rales.  Musculoskeletal:     Cervical back: Neck supple.  Lymphadenopathy:     Cervical: No cervical adenopathy.  Neurological:     Mental Status: She is alert.            Assessment & Plan:

## 2022-07-01 DIAGNOSIS — N907 Vulvar cyst: Secondary | ICD-10-CM | POA: Diagnosis not present

## 2022-07-02 ENCOUNTER — Ambulatory Visit
Admission: RE | Admit: 2022-07-02 | Discharge: 2022-07-02 | Disposition: A | Discharge status: Home / Self Care | Payer: BC Managed Care – PPO | Source: Ambulatory Visit | Attending: Obstetrics and Gynecology | Admitting: Obstetrics and Gynecology

## 2022-07-02 DIAGNOSIS — Z803 Family history of malignant neoplasm of breast: Secondary | ICD-10-CM

## 2022-07-02 DIAGNOSIS — Z1239 Encounter for other screening for malignant neoplasm of breast: Secondary | ICD-10-CM | POA: Diagnosis not present

## 2022-07-02 DIAGNOSIS — R928 Other abnormal and inconclusive findings on diagnostic imaging of breast: Secondary | ICD-10-CM | POA: Diagnosis not present

## 2022-07-02 MED ORDER — GADOPICLENOL 0.5 MMOL/ML IV SOLN
7.5000 mL | Freq: Once | INTRAVENOUS | Status: AC | PRN
  Administered 2022-07-02: 7.5 mL via INTRAVENOUS

## 2022-09-05 ENCOUNTER — Other Ambulatory Visit: Payer: Self-pay | Admitting: Family Medicine

## 2022-09-05 DIAGNOSIS — E039 Hypothyroidism, unspecified: Secondary | ICD-10-CM

## 2022-09-05 DIAGNOSIS — E7800 Pure hypercholesterolemia, unspecified: Secondary | ICD-10-CM

## 2022-09-05 DIAGNOSIS — E559 Vitamin D deficiency, unspecified: Secondary | ICD-10-CM

## 2022-09-05 DIAGNOSIS — Z862 Personal history of diseases of the blood and blood-forming organs and certain disorders involving the immune mechanism: Secondary | ICD-10-CM

## 2022-09-05 DIAGNOSIS — Z85118 Personal history of other malignant neoplasm of bronchus and lung: Secondary | ICD-10-CM

## 2022-09-10 ENCOUNTER — Other Ambulatory Visit (INDEPENDENT_AMBULATORY_CARE_PROVIDER_SITE_OTHER): Payer: BC Managed Care – PPO

## 2022-09-10 DIAGNOSIS — E7800 Pure hypercholesterolemia, unspecified: Secondary | ICD-10-CM | POA: Diagnosis not present

## 2022-09-10 DIAGNOSIS — Z85118 Personal history of other malignant neoplasm of bronchus and lung: Secondary | ICD-10-CM | POA: Diagnosis not present

## 2022-09-10 DIAGNOSIS — E559 Vitamin D deficiency, unspecified: Secondary | ICD-10-CM

## 2022-09-10 DIAGNOSIS — E039 Hypothyroidism, unspecified: Secondary | ICD-10-CM

## 2022-09-10 LAB — COMPREHENSIVE METABOLIC PANEL
ALT: 12 U/L (ref 0–35)
AST: 13 U/L (ref 0–37)
Albumin: 4.1 g/dL (ref 3.5–5.2)
Alkaline Phosphatase: 69 U/L (ref 39–117)
BUN: 17 mg/dL (ref 6–23)
CO2: 30 mEq/L (ref 19–32)
Calcium: 9.6 mg/dL (ref 8.4–10.5)
Chloride: 99 mEq/L (ref 96–112)
Creatinine, Ser: 0.72 mg/dL (ref 0.40–1.20)
GFR: 92.4 mL/min (ref >60.00)
Glucose, Bld: 109 mg/dL — ABNORMAL HIGH (ref 70–99)
Potassium: 3.4 mEq/L — ABNORMAL LOW (ref 3.5–5.1)
Sodium: 141 mEq/L (ref 135–145)
Total Bilirubin: 1.2 mg/dL (ref 0.2–1.2)
Total Protein: 6.5 g/dL (ref 6.0–8.3)

## 2022-09-10 LAB — CBC WITH DIFFERENTIAL/PLATELET
Basophils Absolute: 0 10*3/uL (ref 0.0–0.1)
Basophils Relative: 0.5 % (ref 0.0–3.0)
Eosinophils Absolute: 0.2 10*3/uL (ref 0.0–0.7)
Eosinophils Relative: 2.7 % (ref 0.0–5.0)
HCT: 39.2 % (ref 36.0–46.0)
Hemoglobin: 13.4 g/dL (ref 12.0–15.0)
Lymphocytes Relative: 26.4 % (ref 12.0–46.0)
Lymphs Abs: 2.3 10*3/uL (ref 0.7–4.0)
MCHC: 34.3 g/dL (ref 30.0–36.0)
MCV: 79.8 fl (ref 78.0–100.0)
Monocytes Absolute: 0.4 10*3/uL (ref 0.1–1.0)
Monocytes Relative: 5 % (ref 3.0–12.0)
Neutro Abs: 5.7 10*3/uL (ref 1.4–7.7)
Neutrophils Relative %: 65.4 % (ref 43.0–77.0)
Platelets: 314 10*3/uL (ref 150.0–400.0)
RBC: 4.91 Mil/uL (ref 3.87–5.11)
RDW: 13.3 % (ref 11.5–15.5)
WBC: 8.7 10*3/uL (ref 4.0–10.5)

## 2022-09-10 LAB — LIPID PANEL
Cholesterol: 219 mg/dL — ABNORMAL HIGH (ref 0–200)
HDL: 55.1 mg/dL (ref >39.00)
LDL Cholesterol: 147 mg/dL — ABNORMAL HIGH (ref 0–99)
NonHDL: 163.73
Total CHOL/HDL Ratio: 4
Triglycerides: 83 mg/dL (ref 0.0–149.0)
VLDL: 16.6 mg/dL (ref 0.0–40.0)

## 2022-09-10 LAB — VITAMIN D 25 HYDROXY (VIT D DEFICIENCY, FRACTURES): VITD: 37.08 ng/mL (ref 30.00–100.00)

## 2022-09-10 LAB — TSH: TSH: 2.75 u[IU]/mL (ref 0.35–5.50)

## 2022-09-17 ENCOUNTER — Encounter: Payer: BC Managed Care – PPO | Admitting: Family Medicine

## 2022-10-06 ENCOUNTER — Other Ambulatory Visit: Payer: Self-pay | Admitting: Family Medicine

## 2022-10-06 DIAGNOSIS — E039 Hypothyroidism, unspecified: Secondary | ICD-10-CM

## 2022-10-20 ENCOUNTER — Other Ambulatory Visit: Payer: Self-pay | Admitting: Family Medicine

## 2022-10-20 DIAGNOSIS — I10 Essential (primary) hypertension: Secondary | ICD-10-CM

## 2022-10-20 NOTE — Telephone Encounter (Signed)
Called and LVM for patient TCB and schedule. Sent mychart message as well.

## 2022-10-20 NOTE — Telephone Encounter (Addendum)
Actually, it looks like pt had labs on 09/10/22. Pt does not need labs the week before cpe. My apologies and pt is aware.

## 2022-10-20 NOTE — Telephone Encounter (Signed)
E-scribed refills.  Pt has CPE on 11/30/22. Plz schedule fasting lab visit at least 1 wk before this date.

## 2022-10-25 DIAGNOSIS — L821 Other seborrheic keratosis: Secondary | ICD-10-CM | POA: Diagnosis not present

## 2022-10-25 DIAGNOSIS — D485 Neoplasm of uncertain behavior of skin: Secondary | ICD-10-CM | POA: Diagnosis not present

## 2022-10-25 DIAGNOSIS — D225 Melanocytic nevi of trunk: Secondary | ICD-10-CM | POA: Diagnosis not present

## 2022-11-09 ENCOUNTER — Ambulatory Visit
Admission: EM | Admit: 2022-11-09 | Discharge: 2022-11-09 | Disposition: A | Discharge status: Home / Self Care | Payer: BC Managed Care – PPO | Attending: Urgent Care | Admitting: Urgent Care

## 2022-11-09 DIAGNOSIS — J069 Acute upper respiratory infection, unspecified: Secondary | ICD-10-CM | POA: Diagnosis not present

## 2022-11-09 MED ORDER — HYDROCOD POLI-CHLORPHE POLI ER 10-8 MG/5ML PO SUER: 5.0000 mL | Freq: Two times a day (BID) | ORAL | 0 refills | Status: AC | PRN

## 2022-11-09 MED ORDER — BENZONATATE 100 MG PO CAPS: ORAL_CAPSULE | ORAL | 0 refills | Status: DC

## 2022-11-09 NOTE — Discharge Instructions (Signed)
Follow up here or with your primary care provider if your symptoms are worsening or not improving.     

## 2022-11-09 NOTE — ED Triage Notes (Signed)
Cough, headache, sinus pressure and pain, that started Saturday. Taking tylenol, mucinex with no relief of symptoms.

## 2022-11-09 NOTE — ED Provider Notes (Signed)
[facility]    CSN: [id number] Arrival date & time: 11/09/22  1850      History   Chief Complaint Chief Complaint  Patient presents with   Cough    HPI Monica [name] is a 57 y.o. female.    Cough   Presents to urgent care with symptoms x 4 days.  He endorses cough, headache, sinus pressure, pain.  She is using Mucinex DM with no relief of symptoms.  Past Medical History:  Diagnosis Date   Allergy    seasonal   Essential hypertension    History of chicken pox    Hyperbilirubinemia 01/19/2017   Hyperlipidemia    Hypothyroidism    Primary adenocarcinoma of upper lobe of right lung 05/28/2016   RUL apex spiculated ~2cm nodule concerning by PET scan - referred to multidisciplinary thoracic oncology clinic S/p thoracoscopic RULobectomy Dorris Fetch) 06/2016   Sarcoidosis, lung ~2004   treated with prendisone and stable since then    Patient Active Problem List   Diagnosis Date Noted   Acute non-recurrent sinusitis 06/18/2022   Foot injury, right, initial encounter 08/21/2021   Cervical radiculopathy 08/21/2021   Vertigo 09/22/2020   Adenocarcinoma of right lung, stage 1 06/16/2020   RUQ abdominal pain 11/17/2018   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   HTN (hypertension) 12/30/2016   Hyperlipidemia 11/19/2016   Fever blister 11/19/2016   Chronic cough 08/17/2016   ETD (Eustachian tube dysfunction), right 08/17/2016   Globus sensation 07/09/2016   History of adenocarcinoma of lung 05/28/2016   Bilateral carpal tunnel syndrome 05/28/2016   Primary osteoarthritis of first carpometacarpal joint of right hand 05/28/2016   Trigger finger of right thumb 05/28/2016   Health maintenance examination 04/01/2015   Vitamin D deficiency 04/01/2015   Obesity, Class II, BMI 35-39.9, no comorbidity 04/01/2015   History of sarcoidosis 10/10/2014   Hypothyroidism 10/10/2014    Past Surgical History:  Procedure Laterality Date   CARDIOVASCULAR STRESS TEST  12/2016    Nuclear CP stress test: hypertensive response to exercise, normal low risk study, EF 68%   COLONOSCOPY  09/2015   WNL Randa Evens)   COLONOSCOPY  11/2019   SSP, hemorrhoids, diverticulosis, rpt 7 yrs ([facility])   ENDOMETRIAL ABLATION W/ NOVASURE  01/2009   Hattie Perch 12/02/2010   ESOPHAGOGASTRODUODENOSCOPY  11/2019   LA Grade A reflux esophagitis, gastric ulcer, gastritis, HH ([facility])   LOBECTOMY Right 06/14/2016   RU Lobectomy; Surgeon: Loreli Slot, MD   LYMPH NODE DISSECTION Right 06/14/2016   RIGHT UPPER LOBE LYMPH NODE DISSECTION;  Surgeon: Loreli Slot, MD   REFRACTIVE SURGERY Bilateral 1998   TUBAL LIGATION  2001   VIDEO ASSISTED THORACOSCOPY (VATS)/WEDGE RESECTION Right 06/14/2016   Surgeon: Loreli Slot, MD    OB History   No obstetric history on file.      Home Medications    Prior to Admission medications   Medication Sig Start Date End Date Taking? Authorizing Provider  acetaminophen (TYLENOL) 325 MG tablet Take 650 mg by mouth every 6 (six) hours as needed.   Yes [provider]  albuterol (VENTOLIN HFA) 108 (90 Base) MCG/ACT inhaler Inhale 2 puffs into the lungs every 6 (six) hours as needed for wheezing or shortness of breath. 06/08/19  Yes Eustaquio Boyden, MD  benzonatate (TESSALON) 100 MG capsule Take 1-2 tablets 3 times a day as needed for cough 11/09/22  Yes Izabellah Dadisman, Jeannett Senior, FNP  chlorpheniramine-HYDROcodone (TUSSIONEX) 10-8 MG/5ML Take 5 mLs by mouth every 12 (twelve) hours as  needed for up to 7 days for cough. This medication is sedating. Do not drive or operate machinery while taking. 11/09/22 11/16/22 Yes Loriana Samad, Jeannett Senior, FNP  Cholecalciferol (VITAMIN D-3) 25 MCG (1000 UT) CAPS Take 2 capsules (2,000 Units total) by mouth at bedtime. 08/21/21  Yes Eustaquio Boyden, MD  fluticasone [facility]) 50 MCG/ACT nasal spray Place 1 spray into both nostrils daily as needed for allergies. 12/31/16  Yes Mikhail, [place], DO  hydrochlorothiazide  (HYDRODIURIL) 25 MG tablet TAKE 1 TABLET(25 MG) BY MOUTH DAILY 10/20/22  Yes Eustaquio Boyden, MD  levothyroxine (SYNTHROID) 75 MCG tablet TAKE 1 TABLET(75 MCG) BY MOUTH DAILY BEFORE BREAKFAST 10/06/22  Yes Eustaquio Boyden, MD  losartan (COZAAR) 25 MG tablet TAKE 1 TABLET(25 MG) BY MOUTH DAILY 10/20/22  Yes Eustaquio Boyden, MD  Potassium 99 MG TABS Take by mouth daily.   Yes [provider]  Red Yeast Rice 600 MG CAPS Take 1 capsule (600 mg total) by mouth in the morning and at bedtime. 08/21/21  Yes Eustaquio Boyden, MD  Spacer/Aero-Holding [facility] Use as directed with albuterol inhaler 06/08/19  Yes Eustaquio Boyden, MD  valACYclovir (VALTREX) 1000 MG tablet Take 2 tablets (2,000 mg total) by mouth 2 (two) times daily. For 1 day as needed for fever blister 07/31/20   Eustaquio Boyden, MD    Family History Family History  Problem Relation Age of Onset   Hypertension Mother    Breast cancer Mother    Prostate cancer Father 37   CAD Maternal Grandmother        MI   Stroke Maternal Grandmother    Stroke Paternal Grandmother    CAD Paternal Grandmother    Diabetes Paternal Grandfather    Colon cancer Neg Hx    Liver cancer Neg Hx    Colon polyps Neg Hx    Esophageal cancer Neg Hx    Rectal cancer Neg Hx    Stomach cancer Neg Hx     Social History Social History   Tobacco Use   Smoking status: Never   Smokeless tobacco: Never  Vaping Use   Vaping Use: Never used  Substance Use Topics   Alcohol use: No    Alcohol/week: 0.0 standard drinks of alcohol   Drug use: No     Allergies   Patient has no known allergies.   Review of Systems Review of Systems  Respiratory:  Positive for cough.      Physical Exam Triage Vital Signs ED Triage Vitals  Enc Vitals Group     BP 11/09/22 1948 127/78     Pulse Rate 11/09/22 1948 86     Resp 11/09/22 1948 16     Temp 11/09/22 1948 98.8 F (37.1 C)     Temp Source 11/09/22 1948 Oral     SpO2 11/09/22 1948 100 %      Weight --      Height --      Head Circumference --      Peak Flow --      Pain Score 11/09/22 1949 0     Pain Loc --      Pain Edu? --      Excl. in GC? --    No data found.  Updated Vital Signs BP 127/78 (BP Location: Left Arm)   Pulse 86   Temp 98.8 F (37.1 C) (Oral)   Resp 16   SpO2 100%   Visual Acuity Right Eye Distance:   Left Eye Distance:   Bilateral Distance:  Right Eye Near:   Left Eye Near:    Bilateral Near:     Physical Exam Vitals reviewed.  Constitutional:      Appearance: Normal appearance.  HENT:     Mouth/Throat:     Mouth: Mucous membranes are moist.     Pharynx: No oropharyngeal exudate or posterior oropharyngeal erythema.  Cardiovascular:     Rate and Rhythm: Normal rate and regular rhythm.     Pulses: Normal pulses.     Heart sounds: Normal heart sounds.  Pulmonary:     Effort: Pulmonary effort is normal.     Breath sounds: Normal breath sounds.  Skin:    General: Skin is warm and dry.  Neurological:     General: No focal deficit present.     Mental Status: She is alert and oriented to person, place, and time.  Psychiatric:        Mood and Affect: Mood normal.        Behavior: Behavior normal.      UC Treatments / Results  Labs (all labs ordered are listed, but only abnormal results are displayed) Labs Reviewed - No data to display  EKG   Radiology No results found.  Procedures Procedures (including critical care time)  Medications Ordered in UC Medications - No data to display  Initial Impression / Assessment and Plan / UC Course  I have reviewed the triage vital signs and the nursing notes.  Pertinent labs & imaging results that were available during my care of the patient were reviewed by me and considered in my medical decision making (see chart for details).   Patient is afebrile here without recent antipyretics. Satting well on room air. Overall is well appearing, well hydrated, without respiratory  distress. Pulmonary exam is unremarkable.  Lungs CTAB without wheezing, rhonchi, rales.  Symptoms are consistent with an acute viral process.  Recommending supportive care and use of OTC medication for symptom control.  Also prescribing Tussionex and benzonatate to help control cough.  Counseled patient on potential for adverse effects with medications prescribed/recommended today, ER and return-to-clinic precautions discussed, patient verbalized understanding and agreement with care plan.   Final Clinical Impressions(s) / UC Diagnoses   Final diagnoses:  Viral URI with cough     Discharge Instructions      Follow up here or with your primary care provider if your symptoms are worsening or not improving.      ED Prescriptions     Medication Sig Dispense Auth. Provider   benzonatate (TESSALON) 100 MG capsule Take 1-2 tablets 3 times a day as needed for cough 30 capsule Anjulie Dipierro, FNP   chlorpheniramine-HYDROcodone (TUSSIONEX) 10-8 MG/5ML Take 5 mLs by mouth every 12 (twelve) hours as needed for up to 7 days for cough. This medication is sedating. Do not drive or operate machinery while taking. 70 mL Dillan Candela, FNP      I have reviewed the PDMP during this encounter.   Charma Igommordino, [place], [place] 11/09/22 2010

## 2022-11-30 ENCOUNTER — Encounter: Payer: Self-pay | Admitting: Family Medicine

## 2022-11-30 ENCOUNTER — Ambulatory Visit (INDEPENDENT_AMBULATORY_CARE_PROVIDER_SITE_OTHER): Payer: BC Managed Care – PPO | Admitting: Family Medicine

## 2022-11-30 VITALS — BP 126/84 | HR 99 | Temp 97.4°F | Ht 61.0 in | Wt 199.5 lb

## 2022-11-30 DIAGNOSIS — E039 Hypothyroidism, unspecified: Secondary | ICD-10-CM | POA: Diagnosis not present

## 2022-11-30 DIAGNOSIS — Z85118 Personal history of other malignant neoplasm of bronchus and lung: Secondary | ICD-10-CM

## 2022-11-30 DIAGNOSIS — B001 Herpesviral vesicular dermatitis: Secondary | ICD-10-CM

## 2022-11-30 DIAGNOSIS — R053 Chronic cough: Secondary | ICD-10-CM

## 2022-11-30 DIAGNOSIS — E7800 Pure hypercholesterolemia, unspecified: Secondary | ICD-10-CM

## 2022-11-30 DIAGNOSIS — Z862 Personal history of diseases of the blood and blood-forming organs and certain disorders involving the immune mechanism: Secondary | ICD-10-CM

## 2022-11-30 DIAGNOSIS — Z0000 Encounter for general adult medical examination without abnormal findings: Secondary | ICD-10-CM

## 2022-11-30 DIAGNOSIS — I10 Essential (primary) hypertension: Secondary | ICD-10-CM

## 2022-11-30 DIAGNOSIS — E559 Vitamin D deficiency, unspecified: Secondary | ICD-10-CM

## 2022-11-30 MED ORDER — LEVOTHYROXINE SODIUM 75 MCG PO TABS: ORAL_TABLET | ORAL | 4 refills | Status: DC

## 2022-11-30 MED ORDER — HYDROCHLOROTHIAZIDE 12.5 MG PO TABS: 12.5000 mg | ORAL_TABLET | Freq: Every day | ORAL | 4 refills | Status: DC

## 2022-11-30 MED ORDER — LOSARTAN POTASSIUM 50 MG PO TABS: 50.0000 mg | ORAL_TABLET | Freq: Every day | ORAL | 4 refills | Status: DC

## 2022-11-30 MED ORDER — VALACYCLOVIR HCL 1 G PO TABS: 2000.0000 mg | ORAL_TABLET | Freq: Two times a day (BID) | ORAL | 1 refills | Status: DC

## 2022-11-30 NOTE — Assessment & Plan Note (Signed)
Appreciate oncology care.  °

## 2022-11-30 NOTE — Assessment & Plan Note (Signed)
Chronic, stable. Continue current regimen. 

## 2022-11-30 NOTE — Assessment & Plan Note (Signed)
Trial daily antihistamine.  Not currently using PPI

## 2022-11-30 NOTE — Assessment & Plan Note (Signed)
Chronic, stable. Chronic hypokalemia on hctz 25 - will drop dose to 12.5mg  and increase losartan to 50mg , return 2 wks rpt labs.

## 2022-11-30 NOTE — Assessment & Plan Note (Signed)
Preventative protocols reviewed and updated unless pt declined. Discussed healthy diet and lifestyle.  

## 2022-11-30 NOTE — Assessment & Plan Note (Signed)
Encouraged healthy diet and lifestyle choices to affect sustainable weight loss.  ?

## 2022-11-30 NOTE — Assessment & Plan Note (Signed)
Chronic, stable on vit D3 2000 IU daily.

## 2022-11-30 NOTE — Progress Notes (Signed)
Ph: [phone]       Fax: [phone]   Patient ID: Monica [name], female    DOB: 03-18-1966, 57 y.o.   MRN: [id number]  This visit was conducted in person.  BP 126/84   Pulse 99   Temp (!) 97.4 F (36.3 C) (Temporal)   Ht 5\' 1"  (1.549 m)   Wt 199 lb 8 oz (90.5 kg)   SpO2 98%   BMI 37.70 kg/m    CC: CPE Subjective:   HPI: Monica [name] is a 57 y.o. female presenting on 11/30/2022 for Annual Exam   Stage IA lung adenocarcinoma s/p thoracoscopic RU lobectomy 06/2016 - incidentally found on CXR for sarcoidosis. Adjuvant chemo was not recommended. Followed regularly by onc Shirline Frees).  Known cervical arthritis and bulging disc followed by [facility] PM&R Maurice Small) s/p MRI.   Notes daytime somnolence. Ongoing fatigue. She does feel restorative sleep. No morning HA. No witnessed apnea, occ snoring, no PNDyspnea.  ESS = 2  Preventative: EGD 11/2019 - LA grade A reflux esophagitis, gastritis, gastric ulcer, rec PPI BID x 8 wks ([facility]) Colonoscopy 11/2019 - SSP, hemorrhoids, diverticulosis, rpt 7 yrs ([facility]) Well woman yearly with GYN Dr Arelia Sneddon, upcoming appt 01/2023.  Mammo yearly Q6 mo alternating with breast MRI through OBGYN - Birads1 MRI 07/2022. High breast cancer lifetime risk >20%.  LMP 2010 s/p endometrial ablation, h/o polyps  BRCA gene negative Lung cancer screen - not eligible  Flu shot yearly  COVID vaccine Moderna 08/2019, 09/2019, booster 07/2020, 01/2021, bivalent booster 07/2021 Tdap 10/2016  Shingrix - thinks 03/2020, 06/2020  Seat belt use discussed  Sunscreen use discussed. No changing moles on skin. Sees derm regularly.  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 Interim medical history since our last visit reviewed. Allergies and medications reviewed and updated. Outpatient Medications Prior to Visit  Medication Sig Dispense Refill   acetaminophen (TYLENOL) 325 MG tablet Take 650 mg by mouth every 6 (six) hours as needed.     albuterol (VENTOLIN HFA) 108 (90 Base) MCG/ACT inhaler Inhale 2 puffs into the lungs every 6 (six) hours as needed for wheezing or shortness of breath. 18 g 3   Cholecalciferol (VITAMIN D-3) 25 MCG (1000 UT) CAPS Take 2 capsules (2,000 Units total) by mouth at bedtime. 60 capsule    fluticasone (FLONASE) 50 MCG/ACT nasal spray Place 1 spray into both nostrils daily as needed for allergies. 16 g 0   Potassium 99 MG TABS Take by mouth daily.     Red Yeast Rice 600 MG CAPS Take 1 capsule (600 mg total) by mouth in the morning and at bedtime.     Spacer/Aero-Holding Rudean Curt Use as directed with albuterol inhaler 1 Units 0   benzonatate (TESSALON) 100 MG capsule Take 1-2 tablets 3 times a day as needed for cough 30 capsule 0   hydrochlorothiazide (HYDRODIURIL) 25 MG tablet TAKE 1 TABLET(25 MG) BY MOUTH DAILY 90 tablet 0   levothyroxine (SYNTHROID) 75 MCG tablet TAKE 1 TABLET(75 MCG) BY MOUTH DAILY BEFORE BREAKFAST 90 tablet 0   losartan (COZAAR) 25 MG tablet TAKE 1 TABLET(25 MG) BY  MOUTH DAILY 90 tablet 0   valACYclovir (VALTREX) 1000 MG tablet Take 2 tablets (2,000 mg total) by mouth 2 (two) times daily. For 1 day as needed for fever blister 12 tablet 1   No facility-administered medications prior to visit.     Per HPI unless specifically indicated in ROS section below Review of Systems  Constitutional:  Negative for activity change, appetite change, chills, fatigue, fever and unexpected weight change.  HENT:  Negative for hearing loss and sore throat.        Tickle in back of throat  Eyes:  Negative for visual disturbance.  Respiratory:   Positive for cough. Negative for chest tightness, shortness of breath and wheezing.   Cardiovascular:  Negative for chest pain, palpitations and leg swelling.  Gastrointestinal:  Negative for abdominal distention, abdominal pain, blood in stool, constipation, diarrhea, nausea and vomiting.  Genitourinary:  Negative for difficulty urinating and hematuria.  Musculoskeletal:  Negative for arthralgias, myalgias and neck pain.  Skin:  Negative for rash.  Neurological:  Negative for dizziness, seizures, syncope and headaches.  Hematological:  Negative for adenopathy. Does not bruise/bleed easily.  Psychiatric/Behavioral:  Negative for dysphoric mood. The patient is not nervous/anxious.     Objective:  BP 126/84   Pulse 99   Temp (!) 97.4 F (36.3 C) (Temporal)   Ht 5\' 1"  (1.549 m)   Wt 199 lb 8 oz (90.5 kg)   SpO2 98%   BMI 37.70 kg/m   Wt Readings from Last 3 Encounters:  11/30/22 199 lb 8 oz (90.5 kg)  06/18/22 199 lb (90.3 kg)  06/14/22 202 lb 2 oz (91.7 kg)      Physical Exam Vitals and nursing note reviewed.  Constitutional:      Appearance: Normal appearance. She is not ill-appearing.  HENT:     Head: Normocephalic and atraumatic.     Right Ear: Tympanic membrane, ear canal and external ear normal. There is no impacted cerumen.     Left Ear: Tympanic membrane, ear canal and external ear normal. There is no impacted cerumen.     Mouth/Throat:     Mouth: Mucous membranes are moist.     Pharynx: Oropharynx is clear. No oropharyngeal exudate or posterior oropharyngeal erythema.  Eyes:     General:        Right eye: No discharge.        Left eye: No discharge.     Extraocular Movements: Extraocular movements intact.     Conjunctiva/sclera: Conjunctivae normal.     Pupils: Pupils are equal, round, and reactive to light.  Neck:     Thyroid: No thyroid mass or thyromegaly.  Cardiovascular:     Rate and Rhythm: Normal rate and regular rhythm.     Pulses: Normal pulses.      Heart sounds: Normal heart sounds. No murmur heard. Pulmonary:     Effort: Pulmonary effort is normal. No respiratory distress.     Breath sounds: Normal breath sounds. No wheezing, rhonchi or rales.  Abdominal:     General: Bowel sounds are normal. There is no distension.     Palpations: Abdomen is soft. There is no mass.     Tenderness: There is no abdominal tenderness. There is no guarding or rebound.     Hernia: No hernia is present.  Musculoskeletal:     Cervical back: Normal range of motion and neck supple. No rigidity.     Right lower leg: No edema.     Left lower  leg: No edema.  Lymphadenopathy:     Cervical: No cervical adenopathy.  Skin:    General: Skin is warm and dry.     Findings: No rash.  Neurological:     General: No focal deficit present.     Mental Status: She is alert. Mental status is at baseline.  Psychiatric:        Mood and Affect: Mood normal.        Behavior: Behavior normal.       Results for orders placed or performed in visit on 09/10/22  CBC with Differential/Platelet  Result Value Ref Range   WBC 8.7 4.0 - 10.5 K/uL   RBC 4.91 3.87 - 5.11 Mil/uL   Hemoglobin 13.4 12.0 - 15.0 g/dL   HCT 81.1 91.4 - 78.2 %   MCV 79.8 78.0 - 100.0 fl   MCHC 34.3 30.0 - 36.0 g/dL   RDW 95.6 21.3 - 08.6 %   Platelets 314.0 150.0 - 400.0 K/uL   Neutrophils Relative % 65.4 43.0 - 77.0 %   Lymphocytes Relative 26.4 12.0 - 46.0 %   Monocytes Relative 5.0 3.0 - 12.0 %   Eosinophils Relative 2.7 0.0 - 5.0 %   Basophils Relative 0.5 0.0 - 3.0 %   Neutro Abs 5.7 1.4 - 7.7 K/uL   Lymphs Abs 2.3 0.7 - 4.0 K/uL   Monocytes Absolute 0.4 0.1 - 1.0 K/uL   Eosinophils Absolute 0.2 0.0 - 0.7 K/uL   Basophils Absolute 0.0 0.0 - 0.1 K/uL  VITAMIN D 25 Hydroxy (Vit-D Deficiency, Fractures)  Result Value Ref Range   VITD 37.08 30.00 - 100.00 ng/mL  TSH  Result Value Ref Range   TSH 2.75 0.35 - 5.50 uIU/mL  Comprehensive metabolic panel  Result Value Ref Range   Sodium 141  135 - 145 mEq/L   Potassium 3.4 (L) 3.5 - 5.1 mEq/L   Chloride 99 96 - 112 mEq/L   CO2 30 19 - 32 mEq/L   Glucose, Bld 109 (H) 70 - 99 mg/dL   BUN 17 6 - 23 mg/dL   Creatinine, Ser 5.78 0.40 - 1.20 mg/dL   Total Bilirubin 1.2 0.2 - 1.2 mg/dL   Alkaline Phosphatase 69 39 - 117 U/L   AST 13 0 - 37 U/L   ALT 12 0 - 35 U/L   Total Protein 6.5 6.0 - 8.3 g/dL   Albumin 4.1 3.5 - 5.2 g/dL   GFR 46.96 >29.52 mL/min   Calcium 9.6 8.4 - 10.5 mg/dL  Lipid panel  Result Value Ref Range   Cholesterol 219 (H) 0 - 200 mg/dL   Triglycerides 84.1 0.0 - 149.0 mg/dL   HDL 32.44 >01.02 mg/dL   VLDL 72.5 0.0 - 36.6 mg/dL   LDL Cholesterol 440 (H) 0 - 99 mg/dL   Total CHOL/HDL Ratio 4    NonHDL 163.73    Lab Results  Component Value Date   VITAMINB12 517 05/21/2016   Assessment & Plan:   Problem List Items Addressed This Visit     History of sarcoidosis   Hypothyroidism    Chronic, stable. Continue current regimen.       Relevant Medications   levothyroxine (SYNTHROID) 75 MCG tablet   Health maintenance examination - Primary (Chronic)    Preventative protocols reviewed and updated unless pt declined. Discussed healthy diet and lifestyle.       Vitamin D deficiency    Chronic, stable on vit D3 2000 IU daily.  Severe obesity (BMI 35.0-39.9) with comorbidity (HCC)    Encouraged healthy diet and lifestyle choices to affect sustainable weight loss.      History of adenocarcinoma of lung    Appreciate oncology care.       Chronic cough    Trial daily antihistamine.  Not currently using PPI      Hyperlipidemia    Chronic, off medication. Continue to monitor.  The 10-year ASCVD risk score (Arnett DK, et al., 2019) is: 3.8%   Values used to calculate the score:     Age: 51 years     Sex: Female     Is Non-Hispanic African American: No     Diabetic: No     Tobacco smoker: No     Systolic Blood Pressure: 126 mmHg     Is BP treated: Yes     HDL Cholesterol: 55.1 mg/dL      Total Cholesterol: 219 mg/dL       Relevant Medications   hydrochlorothiazide (HYDRODIURIL) 12.5 MG tablet   losartan (COZAAR) 50 MG tablet   Fever blister    Rx Valtrex PRN      Relevant Medications   valACYclovir (VALTREX) 1000 MG tablet   HTN (hypertension)    Chronic, stable. Chronic hypokalemia on hctz 25 - will drop dose to 12.5mg  and increase losartan to 50mg , return 2 wks rpt labs.       Relevant Medications   hydrochlorothiazide (HYDRODIURIL) 12.5 MG tablet   losartan (COZAAR) 50 MG tablet     Meds ordered this encounter  Medications   hydrochlorothiazide (HYDRODIURIL) 12.5 MG tablet    Sig: Take 1 tablet (12.5 mg total) by mouth daily.    Dispense:  90 tablet    Refill:  4    Note new dose   levothyroxine (SYNTHROID) 75 MCG tablet    Sig: TAKE 1 TABLET(75 MCG) BY MOUTH DAILY BEFORE BREAKFAST    Dispense:  90 tablet    Refill:  4   losartan (COZAAR) 50 MG tablet    Sig: Take 1 tablet (50 mg total) by mouth daily.    Dispense:  90 tablet    Refill:  4    Note new dose   valACYclovir (VALTREX) 1000 MG tablet    Sig: Take 2 tablets (2,000 mg total) by mouth 2 (two) times daily. For 1 day as needed for fever blister    Dispense:  12 tablet    Refill:  1    No orders of the defined types were placed in this encounter.   Patient Instructions  Sleep questionnaire today  Drop hydrochlorothiazide to 12.5mg  daily, increase losartan to 50mg  daily, new doses at pharmacy. Check labs in 2 weeks.  Consider trial of daily claritin or zyrtec, and/or flonase for possible allergy component to throat irritation Good to see you today.  Return in 1 year for next physical.   Follow up plan: Return in about 1 year (around 11/30/2023) for annual exam, prior fasting for blood work.  Eustaquio Boyden, MD

## 2022-11-30 NOTE — Assessment & Plan Note (Signed)
Chronic, off medication. Continue to monitor.  The 10-year ASCVD risk score (Arnett DK, et al., 2019) is: 3.8%   Values used to calculate the score:     Age: 57 years     Sex: Female     Is Non-Hispanic African American: No     Diabetic: No     Tobacco smoker: No     Systolic Blood Pressure: 126 mmHg     Is BP treated: Yes     HDL Cholesterol: 55.1 mg/dL     Total Cholesterol: 219 mg/dL

## 2022-11-30 NOTE — Patient Instructions (Addendum)
Sleep questionnaire today  Drop hydrochlorothiazide to 12.5mg  daily, increase losartan to 50mg  daily, new doses at pharmacy. Check labs in 2 weeks.  Consider trial of daily claritin or zyrtec, and/or flonase for possible allergy component to throat irritation Good to see you today.  Return in 1 year for next physical.

## 2022-11-30 NOTE — Assessment & Plan Note (Signed)
Rx Valtrex PRN

## 2022-12-01 MED ORDER — VITAMIN D3 50 MCG (2000 UT) PO CAPS: 4000.0000 [IU] | ORAL_CAPSULE | Freq: Every day | ORAL | Status: AC

## 2022-12-15 ENCOUNTER — Other Ambulatory Visit: Payer: Self-pay | Admitting: Family Medicine

## 2022-12-15 DIAGNOSIS — E876 Hypokalemia: Secondary | ICD-10-CM

## 2022-12-15 DIAGNOSIS — I10 Essential (primary) hypertension: Secondary | ICD-10-CM

## 2022-12-16 ENCOUNTER — Ambulatory Visit (INDEPENDENT_AMBULATORY_CARE_PROVIDER_SITE_OTHER): Payer: BC Managed Care – PPO | Admitting: Podiatry

## 2022-12-16 DIAGNOSIS — Q828 Other specified congenital malformations of skin: Secondary | ICD-10-CM | POA: Diagnosis not present

## 2022-12-16 NOTE — Progress Notes (Signed)
  Subjective:  Patient ID: Monica [name], female    DOB: Nov 02, 1965,  MRN: [id number]  Chief Complaint  Patient presents with   Foot Problem    Object in left foot. Redness noted.  Patient is not diabetic.     57 y.o. female presents patient presents with painful mass in the bottom of the left foot.  She says there is a hard callus area that feels like something may be stuck in her foot.  She does have some redness surrounding it.  Does not have a history of diabetes  Past Medical History:  Diagnosis Date   Allergy    seasonal   Essential hypertension    History of chicken pox    Hyperbilirubinemia 01/19/2017   Hyperlipidemia    Hypothyroidism    Primary adenocarcinoma of upper lobe of right lung (HCC) 05/28/2016   RUL apex spiculated ~2cm nodule concerning by PET scan - referred to multidisciplinary thoracic oncology clinic S/p thoracoscopic RULobectomy Dorris Fetch) 06/2016   Sarcoidosis, lung (HCC) ~2004   treated with prendisone and stable since then    No Known Allergies  ROS: Negative except as per HPI above  Objective:  General: AAO x3, NAD  Dermatological: Attention the left medial midfoot area there is noted to be hyperkeratotic lesion with central hyperkeratotic core.  Consistent with possible porokeratosis.  Upon debridement there is no obvious foreign body present underlying the hyperkeratotic tissue.  Vascular:  Dorsalis Pedis artery and Posterior Tibial artery pedal pulses are 2/4 bilateral.  Capillary fill time < 3 sec to all digits.   Neruologic: Grossly intact via light touch bilateral. Protective threshold intact to all sites bilateral.   Musculoskeletal: No gross boney pedal deformities bilateral. No pain, crepitus, or limitation noted with foot and ankle range of motion bilateral. Muscular strength 5/5 in all groups tested bilateral.  Gait: Unassisted, Nonantalgic.   No images are attached to the encounter.  Radiographs:  Deferred Assessment:    1. Porokeratosis      Plan:  Patient was evaluated and treated and all questions answered.  # Porokeratosis of left foot -Discussed with the patient that is possible she is has a porokeratosis or hyperkeratotic lesion that is causing her pain.  This was debrided as below. -No evidence of foreign body however she continues to have pain and will either work its way out or we will have to go in and do a further foreign body removal procedure All symptomatic hyperkeratoses were safely debrided with a sterile #15 blade to patient's level of comfort without incident. We discussed preventative and palliative care of these lesions including supportive and accommodative shoegear, padding, prefabricated and custom molded accommodative orthoses, use of a pumice stone and lotions/creams daily.         Corinna Gab, DPM [facility] / [facility]

## 2022-12-17 ENCOUNTER — Other Ambulatory Visit (INDEPENDENT_AMBULATORY_CARE_PROVIDER_SITE_OTHER): Payer: BC Managed Care – PPO

## 2022-12-17 DIAGNOSIS — I10 Essential (primary) hypertension: Secondary | ICD-10-CM

## 2022-12-17 DIAGNOSIS — E876 Hypokalemia: Secondary | ICD-10-CM

## 2022-12-17 LAB — BASIC METABOLIC PANEL
BUN: 13 mg/dL (ref 6–23)
CO2: 30 mEq/L (ref 19–32)
Calcium: 9.4 mg/dL (ref 8.4–10.5)
Chloride: 100 mEq/L (ref 96–112)
Creatinine, Ser: 0.74 mg/dL (ref 0.40–1.20)
GFR: 89.24 mL/min (ref >60.00)
Glucose, Bld: 101 mg/dL — ABNORMAL HIGH (ref 70–99)
Potassium: 3.8 mEq/L (ref 3.5–5.1)
Sodium: 137 mEq/L (ref 135–145)

## 2022-12-17 LAB — MAGNESIUM: Magnesium: 1.8 mg/dL (ref 1.5–2.5)

## 2022-12-20 ENCOUNTER — Ambulatory Visit: Payer: BC Managed Care – PPO | Admitting: Podiatry

## 2022-12-23 DIAGNOSIS — Z1231 Encounter for screening mammogram for malignant neoplasm of breast: Secondary | ICD-10-CM | POA: Diagnosis not present

## 2022-12-23 DIAGNOSIS — Z13820 Encounter for screening for osteoporosis: Secondary | ICD-10-CM | POA: Diagnosis not present

## 2023-03-21 DIAGNOSIS — Z1151 Encounter for screening for human papillomavirus (HPV): Secondary | ICD-10-CM | POA: Diagnosis not present

## 2023-03-21 DIAGNOSIS — Z01419 Encounter for gynecological examination (general) (routine) without abnormal findings: Secondary | ICD-10-CM | POA: Diagnosis not present

## 2023-03-21 DIAGNOSIS — Z6837 Body mass index (BMI) 37.0-37.9, adult: Secondary | ICD-10-CM | POA: Diagnosis not present

## 2023-03-21 DIAGNOSIS — Z124 Encounter for screening for malignant neoplasm of cervix: Secondary | ICD-10-CM | POA: Diagnosis not present

## 2023-03-23 ENCOUNTER — Other Ambulatory Visit: Payer: Self-pay | Admitting: Obstetrics and Gynecology

## 2023-03-23 DIAGNOSIS — Z803 Family history of malignant neoplasm of breast: Secondary | ICD-10-CM

## 2023-06-07 ENCOUNTER — Encounter: Payer: Self-pay | Admitting: Obstetrics and Gynecology

## 2023-06-10 ENCOUNTER — Inpatient Hospital Stay: Payer: BC Managed Care – PPO | Attending: Internal Medicine

## 2023-06-10 ENCOUNTER — Encounter (HOSPITAL_COMMUNITY): Payer: Self-pay

## 2023-06-10 ENCOUNTER — Ambulatory Visit (HOSPITAL_COMMUNITY)
Admission: RE | Admit: 2023-06-10 | Discharge: 2023-06-10 | Disposition: A | Discharge status: Home / Self Care | Payer: BC Managed Care – PPO | Source: Ambulatory Visit | Attending: Internal Medicine | Admitting: Internal Medicine

## 2023-06-10 DIAGNOSIS — J929 Pleural plaque without asbestos: Secondary | ICD-10-CM | POA: Diagnosis not present

## 2023-06-10 DIAGNOSIS — Z79899 Other long term (current) drug therapy: Secondary | ICD-10-CM | POA: Insufficient documentation

## 2023-06-10 DIAGNOSIS — C3490 Malignant neoplasm of unspecified part of unspecified bronchus or lung: Secondary | ICD-10-CM | POA: Insufficient documentation

## 2023-06-10 DIAGNOSIS — K449 Diaphragmatic hernia without obstruction or gangrene: Secondary | ICD-10-CM | POA: Diagnosis not present

## 2023-06-10 DIAGNOSIS — R739 Hyperglycemia, unspecified: Secondary | ICD-10-CM | POA: Insufficient documentation

## 2023-06-10 DIAGNOSIS — Z902 Acquired absence of lung [part of]: Secondary | ICD-10-CM | POA: Insufficient documentation

## 2023-06-10 DIAGNOSIS — Z85118 Personal history of other malignant neoplasm of bronchus and lung: Secondary | ICD-10-CM | POA: Insufficient documentation

## 2023-06-10 DIAGNOSIS — I700 Atherosclerosis of aorta: Secondary | ICD-10-CM | POA: Diagnosis not present

## 2023-06-10 LAB — CBC WITH DIFFERENTIAL (CANCER CENTER ONLY)
Abs Immature Granulocytes: 0.02 10*3/uL (ref 0.00–0.07)
Basophils Absolute: 0.1 10*3/uL (ref 0.0–0.1)
Basophils Relative: 1 %
Eosinophils Absolute: 0.2 10*3/uL (ref 0.0–0.5)
Eosinophils Relative: 3 %
HCT: 41.4 % (ref 36.0–46.0)
Hemoglobin: 13.6 g/dL (ref 12.0–15.0)
Immature Granulocytes: 0 %
Lymphocytes Relative: 22 %
Lymphs Abs: 1.6 10*3/uL (ref 0.7–4.0)
MCH: 26.8 pg (ref 26.0–34.0)
MCHC: 32.9 g/dL (ref 30.0–36.0)
MCV: 81.7 fL (ref 80.0–100.0)
Monocytes Absolute: 0.3 10*3/uL (ref 0.1–1.0)
Monocytes Relative: 5 %
Neutro Abs: 4.9 10*3/uL (ref 1.7–7.7)
Neutrophils Relative %: 69 %
Platelet Count: 289 10*3/uL (ref 150–400)
RBC: 5.07 MIL/uL (ref 3.87–5.11)
RDW: 13.5 % (ref 11.5–15.5)
WBC Count: 7.1 10*3/uL (ref 4.0–10.5)
nRBC: 0 % (ref 0.0–0.2)

## 2023-06-10 LAB — CMP (CANCER CENTER ONLY)
ALT: 11 U/L (ref 0–44)
AST: 13 U/L — ABNORMAL LOW (ref 15–41)
Albumin: 4 g/dL (ref 3.5–5.0)
Alkaline Phosphatase: 72 U/L (ref 38–126)
Anion gap: 6 (ref 5–15)
BUN: 14 mg/dL (ref 6–20)
CO2: 30 mmol/L (ref 22–32)
Calcium: 9.5 mg/dL (ref 8.9–10.3)
Chloride: 102 mmol/L (ref 98–111)
Creatinine: 0.78 mg/dL (ref 0.44–1.00)
GFR, Estimated: >60 mL/min (ref >60)
Glucose, Bld: 157 mg/dL — ABNORMAL HIGH (ref 70–99)
Potassium: 4.6 mmol/L (ref 3.5–5.1)
Sodium: 138 mmol/L (ref 135–145)
Total Bilirubin: 1.4 mg/dL — ABNORMAL HIGH (ref <1.2)
Total Protein: 7 g/dL (ref 6.5–8.1)

## 2023-06-10 MED ORDER — IOHEXOL 300 MG/ML  SOLN
75.0000 mL | Freq: Once | INTRAMUSCULAR | Status: AC | PRN
  Administered 2023-06-10: 75 mL via INTRAVENOUS

## 2023-06-14 ENCOUNTER — Inpatient Hospital Stay (HOSPITAL_BASED_OUTPATIENT_CLINIC_OR_DEPARTMENT_OTHER): Payer: BC Managed Care – PPO | Admitting: Internal Medicine

## 2023-06-14 VITALS — BP 136/82 | HR 69 | Temp 98.0°F | Resp 16 | Ht 61.0 in | Wt 197.0 lb

## 2023-06-14 DIAGNOSIS — R739 Hyperglycemia, unspecified: Secondary | ICD-10-CM | POA: Diagnosis not present

## 2023-06-14 DIAGNOSIS — Z79899 Other long term (current) drug therapy: Secondary | ICD-10-CM | POA: Diagnosis not present

## 2023-06-14 DIAGNOSIS — Z902 Acquired absence of lung [part of]: Secondary | ICD-10-CM | POA: Diagnosis not present

## 2023-06-14 DIAGNOSIS — C3490 Malignant neoplasm of unspecified part of unspecified bronchus or lung: Secondary | ICD-10-CM

## 2023-06-14 NOTE — Progress Notes (Signed)
[facility] [phone]   [phone]  OFFICE PROGRESS NOTE  Monica Boyden, MD [address] [place] [address]  DIAGNOSIS: Stage IA (T1a, N0, M0) non-small cell lung cancer, adenocarcinoma presented with right upper lobe pulmonary nodule diagnosed in November 2017.  PRIOR THERAPY:status post right upper lobectomy with lymph node dissection in November 2017 under the care of Dr. Dorris Fetch.  CURRENT THERAPY: Observation.  INTERVAL HISTORY: Monica [name] 57 y.o. female returns to the clinic today for annual follow-up visit.Discussed the use of AI scribe software for clinical note transcription with the patient, who gave verbal consent to proceed.  History of Present Illness   Monica [name], a 57 year old patient with a history of stage 1A non-small cell lung cancer, underwent a right upper lobectomy in November 2017. Since then, she has been under observation with annual follow-ups. Over the past year, she reports no new symptoms, specifically denying chest pain, breathing issues, nausea, vomiting, and headaches. Despite attempts, she has not experienced any weight loss.  Her most recent scan of the chest shows no new findings, and she expresses a preference to continue annual follow-ups with the specialist rather than transitioning to a primary care provider. She reports no changes in medication regimen.  She also notes a recent concern regarding elevated blood sugar levels, with a fasting blood sugar of 157. She plans to discuss this with her primary care provider for further evaluation and management.       MEDICAL HISTORY: Past Medical History:  Diagnosis Date   Allergy    seasonal   Essential hypertension    History of chicken pox    Hyperbilirubinemia 01/19/2017   Hyperlipidemia    Hypothyroidism    Primary adenocarcinoma of upper lobe of right lung (HCC) 05/28/2016   RUL apex spiculated ~2cm nodule concerning by  PET scan - referred to multidisciplinary thoracic oncology clinic S/p thoracoscopic RULobectomy Dorris Fetch) 06/2016   Sarcoidosis, lung (HCC) ~2004   treated with prendisone and stable since then    ALLERGIES:  has No Known Allergies.  MEDICATIONS:  Current Outpatient Medications  Medication Sig Dispense Refill   acetaminophen (TYLENOL) 325 MG tablet Take 650 mg by mouth every 6 (six) hours as needed.     albuterol (VENTOLIN HFA) 108 (90 Base) MCG/ACT inhaler Inhale 2 puffs into the lungs every 6 (six) hours as needed for wheezing or shortness of breath. 18 g 3   Cholecalciferol (VITAMIN D3) 50 MCG (2000 UT) capsule Take 2 capsules (4,000 Units total) by mouth daily.     fluticasone (FLONASE) 50 MCG/ACT nasal spray Place 1 spray into both nostrils daily as needed for allergies. 16 g 0   hydrochlorothiazide (HYDRODIURIL) 12.5 MG tablet Take 1 tablet (12.5 mg total) by mouth daily. 90 tablet 4   levothyroxine (SYNTHROID) 75 MCG tablet TAKE 1 TABLET(75 MCG) BY MOUTH DAILY BEFORE BREAKFAST 90 tablet 4   losartan (COZAAR) 50 MG tablet Take 1 tablet (50 mg total) by mouth daily. 90 tablet 4   Potassium 99 MG TABS Take by mouth daily.     Red Yeast Rice 600 MG CAPS Take 1 capsule (600 mg total) by mouth in the morning and at bedtime.     Spacer/Aero-Holding Rudean Curt Use as directed with albuterol inhaler 1 Units 0   valACYclovir (VALTREX) 1000 MG tablet Take 2 tablets (2,000 mg total) by mouth 2 (two) times daily. For 1 day as needed for fever blister 12  tablet 1   No current facility-administered medications for this visit.    SURGICAL HISTORY:  Past Surgical History:  Procedure Laterality Date   CARDIOVASCULAR STRESS TEST  12/2016   Nuclear CP stress test: hypertensive response to exercise, normal low risk study, EF 68%   COLONOSCOPY  09/2015   WNL Randa Evens)   COLONOSCOPY  11/2019   SSP, hemorrhoids, diverticulosis, rpt 7 yrs ([place])   ENDOMETRIAL ABLATION W/ NOVASURE  01/2009    Hattie Perch 12/02/2010   ESOPHAGOGASTRODUODENOSCOPY  11/2019   LA Grade A reflux esophagitis, gastric ulcer, gastritis, HH ([place])   LOBECTOMY Right 06/14/2016   RU Lobectomy; Surgeon: Loreli Slot, MD   LYMPH NODE DISSECTION Right 06/14/2016   RIGHT UPPER LOBE LYMPH NODE DISSECTION;  Surgeon: Loreli Slot, MD   REFRACTIVE SURGERY Bilateral 1998   TUBAL LIGATION  2001   VIDEO ASSISTED THORACOSCOPY (VATS)/WEDGE RESECTION Right 06/14/2016   Surgeon: Loreli Slot, MD    REVIEW OF SYSTEMS:  A comprehensive review of systems was negative.   PHYSICAL EXAMINATION: General appearance: alert, cooperative, and no distress Head: Normocephalic, without obvious abnormality, atraumatic Neck: no adenopathy, no JVD, supple, symmetrical, trachea midline, and thyroid not enlarged, symmetric, no tenderness/mass/nodules Lymph nodes: Cervical, supraclavicular, and axillary nodes normal. Resp: clear to auscultation bilaterally Back: symmetric, no curvature. ROM normal. No CVA tenderness. Cardio: regular rate and rhythm, S1, S2 normal, no murmur, click, rub or gallop GI: soft, non-tender; bowel sounds normal; no masses,  no organomegaly Extremities: extremities normal, atraumatic, no cyanosis or edema  ECOG PERFORMANCE STATUS: 0 - Asymptomatic  Blood pressure 136/82, pulse 69, temperature 98 F (36.7 C), temperature source Oral, resp. rate 16, height 5\' 1"  (1.549 m), weight 197 lb (89.4 kg), SpO2 100%.  LABORATORY DATA: Lab Results  Component Value Date   WBC 7.1 06/10/2023   HGB 13.6 06/10/2023   HCT 41.4 06/10/2023   MCV 81.7 06/10/2023   PLT 289 06/10/2023      Chemistry      Component Value Date/Time   NA 138 06/10/2023 0843   NA 140 07/20/2017 1338   K 4.6 06/10/2023 0843   K 4.1 07/20/2017 1338   CL 102 06/10/2023 0843   CO2 30 06/10/2023 0843   CO2 26 07/20/2017 1338   BUN 14 06/10/2023 0843   BUN 13.4 07/20/2017 1338   CREATININE 0.78 06/10/2023 0843    CREATININE 0.8 07/20/2017 1338      Component Value Date/Time   CALCIUM 9.5 06/10/2023 0843   CALCIUM 9.7 07/20/2017 1338   ALKPHOS 72 06/10/2023 0843   ALKPHOS 77 07/20/2017 1338   AST 13 (L) 06/10/2023 0843   AST 15 07/20/2017 1338   ALT 11 06/10/2023 0843   ALT 12 07/20/2017 1338   BILITOT 1.4 (H) 06/10/2023 0843   BILITOT 1.71 (H) 07/20/2017 1338       RADIOGRAPHIC STUDIES: CT Chest W Contrast  Result Date: 06/13/2023 CLINICAL DATA:  Non-small-cell lung cancer. Prior right upper lobectomy. * Tracking Code: BO * EXAM: CT CHEST WITH CONTRAST TECHNIQUE: Multidetector CT imaging of the chest was performed during intravenous contrast administration. RADIATION DOSE REDUCTION: This exam was performed according to the departmental dose-optimization program which includes automated exposure control, adjustment of the mA and/or kV according to patient size and/or use of iterative reconstruction technique. CONTRAST:  75mL OMNIPAQUE IOHEXOL 300 MG/ML  SOLN COMPARISON:  CT 06/11/2022 and older. FINDINGS: Cardiovascular: Mild atherosclerotic changes along the thoracic aorta. Heart is nonenlarged. No pericardial effusion.  Mediastinum/Nodes: Preserved thyroid gland. Slightly patulous thoracic esophagus. Small hiatal hernia. No specific abnormal lymph node enlargement seen in the axillary region. There are some small mediastinal nodes identified which are less than a cm in short axis and not pathologic by size criteria. These are unchanged from previous. More prominent hilar nodes are again seen. The node in the right hilum which had short axis previously 10 mm, today on series 2, image 67 measures 9 mm, similar. Small lymph node along the left hilum which previously had short axis dimension of 8 mm, today on image 62 of series 2, 9 mm, again not significantly changed. No new nodal enlargement identified. Lungs/Pleura: No consolidation, pneumothorax or effusion. Some basilar atelectasis. Surgical changes  from right-sided lobectomy with scarring, fibrosis and pleural thickening. Once again there are several small nodules. Similar solid, similar semi-solid. For example right lower lobe nodule which previously measured 4 mm on the prior, today is stable on series 8, image 82. This has also stable going back to a CT scan of November 2020. The right apical lesion which previously measured 5 mm is stable today on image 37 of series 8. This also has been present since November 2020. Additional foci include a punctate focus left upper lobe juxtapleural on series 8, image 48 demonstrating also long-term stability. Nodule in the anterior margin of the superior segment left lower lobe on image 8 image 64 is also stable measuring 4-5 mm. 3 mm focus left lower lobe series 8, image 84 is also stable. And lastly 5 mm left apical nodule is essentially stable today on series 8, image 28. Again almost 4 years stability. Additional areas elsewhere. Upper Abdomen: Fatty liver infiltration. Adrenal glands are preserved. Colonic diverticula identified of the transverse colon included in the imaging field. Few small upper abdominal lymph nodes identified. Musculoskeletal: Scattered degenerative changes of the spine. IMPRESSION: No significant interval change. Stable surgical changes right upper lung. Stable small prominent hilar and mediastinal lymph nodes. These has been present since at least November 2020 demonstrating long-term stability. Multiple small lung nodules identified. These are actually has been stable going back to as well to November 2020 demonstrating long-term stability. Aortic Atherosclerosis (ICD10-I70.0). Electronically Signed   By: Karen Kays M.D.   On: 06/13/2023 19:48     ASSESSMENT AND PLAN: This is a very pleasant 57 years old white female with stage IA non-small cell lung cancer, adenocarcinoma presented with right upper lobe pulmonary nodule is status post right upper lobectomy with lymph node dissection  in November 2017 under the care of Dr. Dorris Fetch. The patient has been on observation since that time and she is feeling fine. She had repeat CT scan of the chest performed recently.  I personally and independently reviewed the scan and discussed the result with the patient today.  Her scan showed no concerning findings for disease recurrence or metastasis.    Stage 1A Non-Small Cell Lung Cancer (NSCLC) Diagnosed in November 2017. Underwent right upper lobectomy by Dr. Dorris Fetch. On observation with annual follow-ups. No new symptoms. Recent chest scan shows no new findings. Prefers to continue annual follow-ups. - Continue annual follow-ups - Order lab work and chest scan one week before next appointment  Hyperglycemia Recent fasting blood sugar level of 157 mg/dL. Advised to follow up with family doctor for further evaluation, including HbA1c test. - Consult family doctor for further evaluation and HbA1c test  General Health Maintenance Attempting weight loss but finding it challenging. - Encourage continued efforts towards  weight loss - Reinforce the importance of regular follow-ups with family doctor for overall health maintenance.   The patient was advised to call immediately if she has any concerning symptoms in the interval. The patient voices understanding of current disease status and treatment options and is in agreement with the current care plan. All questions were answered. The patient knows to call the clinic with any problems, questions or concerns. We can certainly see the patient much sooner if necessary.  Disclaimer: This note was dictated with voice recognition software. Similar sounding words can inadvertently be transcribed and may not be corrected upon review.

## 2023-06-17 ENCOUNTER — Encounter: Payer: Self-pay | Admitting: Family Medicine

## 2023-06-17 ENCOUNTER — Ambulatory Visit: Payer: BC Managed Care – PPO | Admitting: Family Medicine

## 2023-06-17 VITALS — BP 118/84 | HR 80 | Temp 98.4°F | Ht 61.0 in | Wt 195.2 lb

## 2023-06-17 DIAGNOSIS — Z6836 Body mass index (BMI) 36.0-36.9, adult: Secondary | ICD-10-CM | POA: Diagnosis not present

## 2023-06-17 DIAGNOSIS — R739 Hyperglycemia, unspecified: Secondary | ICD-10-CM | POA: Insufficient documentation

## 2023-06-17 DIAGNOSIS — R7303 Prediabetes: Secondary | ICD-10-CM | POA: Insufficient documentation

## 2023-06-17 LAB — POCT GLYCOSYLATED HEMOGLOBIN (HGB A1C): Hemoglobin A1C: 5.5 % (ref 4.0–5.6)

## 2023-06-17 NOTE — Progress Notes (Signed)
Ph: [phone] Fax: [phone]   Patient ID: Monica [name], female    DOB: [birth year], 57 y.o.   MRN: [id number]  This visit was conducted in person.  BP 118/84   Pulse 80   Temp 98.4 F (36.9 C) (Oral)   Ht 5\' 1"  (1.549 m)   Wt 195 lb 4 oz (88.6 kg)   SpO2 98%   BMI 36.89 kg/m    CC: discuss sugar Subjective:   HPI: [name] is a 57 y.o. female presenting on 06/17/2023 for Results (Seen recently at cancer ctr and was told to f/u w/ PCP for elevated BS reading, 157. Suggested she have A1c checked. )   Recent fasting blood sugar 157 done by oncology department.  She was fasting for these labs, however she did some regular Gatorade prior to the blood work so not truly fasting.  Lab Results  Component Value Date   HGBA1C 5.5 06/17/2023   Obesity - trouble losing weight - not interested in injection medication.       Relevant past medical, surgical, family and social history reviewed and updated as indicated. Interim medical history since our last visit reviewed. Allergies and medications reviewed and updated. Outpatient Medications Prior to Visit  Medication Sig Dispense Refill   acetaminophen (TYLENOL) 325 MG tablet Take 650 mg by mouth every 6 (six) hours as needed.     albuterol (VENTOLIN HFA) 108 (90 Base) MCG/ACT inhaler Inhale 2 puffs into the lungs every 6 (six) hours as needed for wheezing or shortness of breath. 18 g 3   Cholecalciferol (VITAMIN D3) 50 MCG (2000 UT) capsule Take 2 capsules (4,000 Units total) by mouth daily.     fluticasone (FLONASE) 50 MCG/ACT nasal spray Place 1 spray into both nostrils daily as needed for allergies. 16 g 0   hydrochlorothiazide (HYDRODIURIL) 12.5 MG tablet Take 1 tablet (12.5 mg total) by mouth daily. 90 tablet 4   levothyroxine (SYNTHROID) 75 MCG tablet TAKE 1 TABLET(75 MCG) BY MOUTH DAILY BEFORE BREAKFAST 90 tablet 4   losartan (COZAAR) 50 MG tablet Take 1 tablet (50 mg total) by mouth daily. 90 tablet 4    Potassium 99 MG TABS Take by mouth daily.     Red Yeast Rice 600 MG CAPS Take 1 capsule (600 mg total) by mouth in the morning and at bedtime.     Spacer/Aero-Holding Rudean Curt Use as directed with albuterol inhaler 1 Units 0   valACYclovir (VALTREX) 1000 MG tablet Take 2 tablets (2,000 mg total) by mouth 2 (two) times daily. For 1 day as needed for fever blister 12 tablet 1   No facility-administered medications prior to visit.     Per HPI unless specifically indicated in ROS section below Review of Systems  Objective:  BP 118/84   Pulse 80   Temp 98.4 F (36.9 C) (Oral)   Ht 5\' 1"  (1.549 m)   Wt 195 lb 4 oz (88.6 kg)   SpO2 98%   BMI 36.89 kg/m   Wt Readings from Last 3 Encounters:  06/17/23 195 lb 4 oz (88.6 kg)  06/14/23 197 lb (89.4 kg)  11/30/22 199 lb 8 oz (90.5 kg)      Physical Exam Vitals and nursing note reviewed.  Constitutional:      Appearance: Normal appearance. She is not ill-appearing.  Neurological:     Mental Status: She is alert.  Psychiatric:        Mood and Affect: Mood normal.  Behavior: Behavior normal.       Results for orders placed or performed in visit on 06/17/23  POCT glycosylated hemoglobin (Hb A1C)  Result Value Ref Range   Hemoglobin A1C 5.5 4.0 - 5.6 %   HbA1c POC (<> result, manual entry)     HbA1c, POC (prediabetic range)     HbA1c, POC (controlled diabetic range)     Lab Results  Component Value Date   NA 138 06/10/2023   CL 102 06/10/2023   K 4.6 06/10/2023   CO2 30 06/10/2023   BUN 14 06/10/2023   CREATININE 0.78 06/10/2023   GFRNONAA >60 06/10/2023   CALCIUM 9.5 06/10/2023   ALBUMIN 4.0 06/10/2023   GLUCOSE 157 (H) 06/10/2023    Assessment & Plan:   Problem List Items Addressed This Visit     Severe obesity (BMI 35.0-39.9) with comorbidity (HCC)    Reviewed diet and lifestyle choices to affect sustainable weight loss. Discussed Noom mindset, [employer], handout provided.  She is  not interested in injectable weight loss medication.       Elevated blood sugar level - Primary    Recent glucose elevated to 157, not truly fasting as she had been drinking gatorade.  A1c reassuring today.       Relevant Orders   POCT glycosylated hemoglobin (Hb A1C) (Completed)     No orders of the defined types were placed in this encounter.   Orders Placed This Encounter  Procedures   POCT glycosylated hemoglobin (Hb A1C)    Patient Instructions  A1c was normal today  Consider weight watchers or Noom program - "the Noom mindset" book Consider [employer] (modified low carb diet) and mediterranean diet - handout provided.  Work on activity in routine.   Follow up plan: No follow-ups on file.  Eustaquio Boyden, MD

## 2023-06-17 NOTE — Assessment & Plan Note (Addendum)
Reviewed diet and lifestyle choices to affect sustainable weight loss. Discussed Noom mindset, [employer], handout provided.  She is not interested in injectable weight loss medication.

## 2023-06-17 NOTE — Patient Instructions (Addendum)
A1c was normal today  Consider weight watchers or Noom program - "the Noom mindset" book Consider [employer] (modified low carb diet) and mediterranean diet - handout provided.  Work on activity in routine.

## 2023-06-17 NOTE — Assessment & Plan Note (Signed)
Recent glucose elevated to 157, not truly fasting as she had been drinking gatorade.  A1c reassuring today.

## 2023-06-24 ENCOUNTER — Ambulatory Visit
Admission: RE | Admit: 2023-06-24 | Discharge: 2023-06-24 | Disposition: A | Discharge status: Home / Self Care | Payer: BC Managed Care – PPO | Source: Ambulatory Visit | Attending: Obstetrics and Gynecology | Admitting: Obstetrics and Gynecology

## 2023-06-24 DIAGNOSIS — Z1239 Encounter for other screening for malignant neoplasm of breast: Secondary | ICD-10-CM | POA: Diagnosis not present

## 2023-06-24 DIAGNOSIS — Z803 Family history of malignant neoplasm of breast: Secondary | ICD-10-CM

## 2023-06-24 MED ORDER — GADOPICLENOL 0.5 MMOL/ML IV SOLN
9.0000 mL | Freq: Once | INTRAVENOUS | Status: AC | PRN
  Administered 2023-06-24: 9 mL via INTRAVENOUS

## 2023-07-02 ENCOUNTER — Other Ambulatory Visit: Payer: Self-pay | Admitting: Family Medicine

## 2023-07-09 ENCOUNTER — Ambulatory Visit
Admission: EM | Admit: 2023-07-09 | Discharge: 2023-07-09 | Disposition: A | Discharge status: Home / Self Care | Payer: BC Managed Care – PPO | Attending: Emergency Medicine | Admitting: Emergency Medicine

## 2023-07-09 ENCOUNTER — Encounter: Payer: Self-pay | Admitting: Emergency Medicine

## 2023-07-09 DIAGNOSIS — J069 Acute upper respiratory infection, unspecified: Secondary | ICD-10-CM | POA: Diagnosis not present

## 2023-07-09 MED ORDER — LIDOCAINE VISCOUS HCL 2 % MT SOLN: 15.0000 mL | OROMUCOSAL | 0 refills | Status: DC | PRN

## 2023-07-09 NOTE — ED Provider Notes (Signed)
[name]    CSN: [id number] Arrival date & time: 07/09/23  7106      History   Chief Complaint Chief Complaint  Patient presents with   Sore Throat   Nasal Congestion   Otalgia   Cough    HPI Monica [name] is a 56 y.o. female.   Patient presents for evaluation of nasal congestion, rhinorrhea, scratchy throat, right-sided ear pain and intermittent generalized headaches beginning 4 days ago.  Cough has become productive today.  Tolerating food and liquids.  No known sick contacts.  Has attempted use of Alka-Seltzer cold and flu tea which was helpful yesterday evening.  Denies fever, shortness of breath or wheezing.  Past Medical History:  Diagnosis Date   Allergy    seasonal   Essential hypertension    History of chicken pox    Hyperbilirubinemia 01/19/2017   Hyperlipidemia    Hypothyroidism    Primary adenocarcinoma of upper lobe of right lung (HCC) 05/28/2016   RUL apex spiculated ~2cm nodule concerning by PET scan - referred to multidisciplinary thoracic oncology clinic S/p thoracoscopic RULobectomy Dorris Fetch) 06/2016   Sarcoidosis, lung (HCC) ~2004   treated with prendisone and stable since then    Patient Active Problem List   Diagnosis Date Noted   Elevated blood sugar level 06/17/2023   Cervical radiculopathy 08/21/2021   Vertigo 09/22/2020   Adenocarcinoma of right lung, stage 1 (HCC) 06/16/2020   RUQ abdominal pain 11/17/2018   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   HTN (hypertension) 12/30/2016   Hyperlipidemia 11/19/2016   Fever blister 11/19/2016   Chronic cough 08/17/2016   ETD (Eustachian tube dysfunction), right 08/17/2016   Globus sensation 07/09/2016   History of adenocarcinoma of lung 05/28/2016   Bilateral carpal tunnel syndrome 05/28/2016   Primary osteoarthritis of first carpometacarpal joint of right hand 05/28/2016   Trigger finger of right thumb 05/28/2016   Health maintenance examination 04/01/2015   Vitamin D deficiency  04/01/2015   Severe obesity (BMI 35.0-39.9) with comorbidity (HCC) 04/01/2015   History of sarcoidosis 10/10/2014   Hypothyroidism 10/10/2014    Past Surgical History:  Procedure Laterality Date   CARDIOVASCULAR STRESS TEST  12/2016   Nuclear CP stress test: hypertensive response to exercise, normal low risk study, EF 68%   COLONOSCOPY  09/2015   WNL Randa Evens)   COLONOSCOPY  11/2019   SSP, hemorrhoids, diverticulosis, rpt 7 yrs ([facility])   ENDOMETRIAL ABLATION W/ NOVASURE  01/2009   Hattie Perch 12/02/2010   ESOPHAGOGASTRODUODENOSCOPY  11/2019   LA Grade A reflux esophagitis, gastric ulcer, gastritis, HH ([facility])   LOBECTOMY Right 06/14/2016   RU Lobectomy; Surgeon: Loreli Slot, MD   LYMPH NODE DISSECTION Right 06/14/2016   RIGHT UPPER LOBE LYMPH NODE DISSECTION;  Surgeon: Loreli Slot, MD   REFRACTIVE SURGERY Bilateral 1998   TUBAL LIGATION  2001   VIDEO ASSISTED THORACOSCOPY (VATS)/WEDGE RESECTION Right 06/14/2016   Surgeon: Loreli Slot, MD    OB History   No obstetric history on file.      Home Medications    Prior to Admission medications   Medication Sig Start Date End Date Taking? Authorizing Provider  lidocaine (XYLOCAINE) 2 % solution Use as directed 15 mLs in the mouth or throat as needed. 07/09/23  Yes Roel Douthat, Elita Boone, NP  acetaminophen (TYLENOL) 325 MG tablet Take 650 mg by mouth every 6 (six) hours as needed.    [provider]  albuterol (VENTOLIN HFA) 108 (90 Base) MCG/ACT inhaler Inhale 2  puffs into the lungs every 6 (six) hours as needed for wheezing or shortness of breath. 06/08/19   Eustaquio Boyden, MD  Cholecalciferol (VITAMIN D3) 50 MCG (2000 UT) capsule Take 2 capsules (4,000 Units total) by mouth daily. 12/01/22   Eustaquio Boyden, MD  fluticasone [facility]) 50 MCG/ACT nasal spray Place 1 spray into both nostrils daily as needed for allergies. 12/31/16   Mikhail, Nita Sells, DO  hydrochlorothiazide (HYDRODIURIL) 12.5 MG tablet  Take 1 tablet (12.5 mg total) by mouth daily. 11/30/22   Eustaquio Boyden, MD  levothyroxine (SYNTHROID) 75 MCG tablet TAKE 1 TABLET(75 MCG) BY MOUTH DAILY BEFORE BREAKFAST 11/30/22   Eustaquio Boyden, MD  losartan (COZAAR) 50 MG tablet Take 1 tablet (50 mg total) by mouth daily. 11/30/22   Eustaquio Boyden, MD  Potassium 99 MG TABS Take by mouth daily.    [provider]  Red Yeast Rice 600 MG CAPS Take 1 capsule (600 mg total) by mouth in the morning and at bedtime. 08/21/21   Eustaquio Boyden, MD  Spacer/Aero-Holding Deretha [facility]ory DEVI Use as directed with albuterol inhaler 06/08/19   Eustaquio Boyden, MD  valACYclovir (VALTREX) 1000 MG tablet TAKE 2 TABLETS(2000 MG) BY MOUTH TWICE DAILY FOR 1 DAY AS NEEDED FOR FEVER BLISTER 07/05/23   Eustaquio Boyden, MD    Family History Family History  Problem Relation Age of Onset   Hypertension Mother    Breast cancer Mother    Prostate cancer Father 29   CAD Maternal Grandmother        MI   Stroke Maternal Grandmother    CAD Maternal Grandfather        MI   Stroke Paternal Grandmother    CAD Paternal Grandmother    Diabetes Paternal Grandfather    Colon cancer Neg Hx    Liver cancer Neg Hx    Colon polyps Neg Hx    Esophageal cancer Neg Hx    Rectal cancer Neg Hx    Stomach cancer Neg Hx     Social History Social History   Tobacco Use   Smoking status: Never   Smokeless tobacco: Never  Vaping Use   Vaping status: Never Used  Substance Use Topics   Alcohol use: No    Alcohol/week: 0.0 standard drinks of alcohol   Drug use: No     Allergies   Patient has no known allergies.   Review of Systems Review of Systems   Physical Exam Triage Vital Signs ED Triage Vitals  Encounter Vitals Group     BP 07/09/23 0916 113/76     Systolic BP Percentile --      Diastolic BP Percentile --      Pulse Rate 07/09/23 0916 83     Resp 07/09/23 0916 16     Temp 07/09/23 0916 98.2 F (36.8 C)     Temp Source 07/09/23 0916 Oral      SpO2 07/09/23 0916 97 %     Weight --      Height --      Head Circumference --      Peak Flow --      Pain Score 07/09/23 0918 0     Pain Loc --      Pain Education --      Exclude from Growth Chart --    No data found.  Updated Vital Signs BP 113/76 (BP Location: Left Arm)   Pulse 83   Temp 98.2 F (36.8 C) (Oral)   Resp 16  SpO2 97%   Visual Acuity Right Eye Distance:   Left Eye Distance:   Bilateral Distance:    Right Eye Near:   Left Eye Near:    Bilateral Near:     Physical Exam Constitutional:      Appearance: Normal appearance. She is well-developed.  HENT:     Right Ear: Tympanic membrane, ear canal and external ear normal.     Left Ear: Tympanic membrane, ear canal and external ear normal.     Nose: Congestion present. No rhinorrhea.     Mouth/Throat:     Pharynx: No oropharyngeal exudate or posterior oropharyngeal erythema.  Eyes:     Extraocular Movements: Extraocular movements intact.  Cardiovascular:     Rate and Rhythm: Normal rate and regular rhythm.     Pulses: Normal pulses.     Heart sounds: Normal heart sounds.  Pulmonary:     Effort: Pulmonary effort is normal.     Breath sounds: Normal breath sounds.  Skin:    General: Skin is warm and dry.  Neurological:     Mental Status: She is alert and oriented to person, place, and time. Mental status is at baseline.      UC Treatments / Results  Labs (all labs ordered are listed, but only abnormal results are displayed) Labs Reviewed - No data to display  EKG   Radiology No results found.  Procedures Procedures (including critical care time)  Medications Ordered in UC Medications - No data to display  Initial Impression / Assessment and Plan / UC Course  I have reviewed the triage vital signs and the nursing notes.  Pertinent labs & imaging results that were available during my care of the patient were reviewed by me and considered in my medical decision making (see chart  for details).  Viral URI with cough  Patient is in no signs of distress nor toxic appearing.  Vital signs are stable.  Low suspicion for pneumonia, pneumothorax or bronchitis and therefore will defer imaging.  Prescribed viscous lidocaine as sore throat is most worrisome symptom, low suspicion for strep, no erythema, tonsillar adenopathy or exudate noted on exam, discussed this with patient.May use additional over-the-counter medications as needed for supportive care.  May follow-up with urgent care as needed if symptoms persist or worsen.  Final Clinical Impressions(s) / UC Diagnoses   Final diagnoses:  Viral URI with cough     Discharge Instructions      Your symptoms today are most likely being caused by a virus and should steadily improve in time it can take up to 7 to 10 days before you truly start to see a turnaround however things will get better  There are no signs of bacterial infection to the ears or to the throat  May gargle and spit solution every 4 hours to provide temporary relief to your throat    You can take Tylenol and/or Ibuprofen as needed for fever reduction and pain relief.   For cough: honey 1/2 to 1 teaspoon (you can dilute the honey in water or another fluid).  You can also use guaifenesin and dextromethorphan for cough. You can use a humidifier for chest congestion and cough.  If you don't have a humidifier, you can sit in the bathroom with the hot shower running.      For sore throat: try warm salt water gargles, cepacol lozenges, throat spray, warm tea or water with lemon/honey, popsicles or ice, or OTC cold relief medicine for throat discomfort.  For congestion: take a daily anti-histamine like Zyrtec, Claritin, and a oral decongestant, such as pseudoephedrine.  You can also use Flonase 1-2 sprays in each nostril daily.   It is important to stay hydrated: drink plenty of fluids (water, gatorade/powerade/pedialyte, juices, or teas) to keep your throat  moisturized and help further relieve irritation/discomfort.    ED Prescriptions     Medication Sig Dispense Auth. Provider   lidocaine (XYLOCAINE) 2 % solution Use as directed 15 mLs in the mouth or throat as needed. 100 mL Valinda Hoar, NP      PDMP not reviewed this encounter.   Valinda Hoar, NP 07/09/23 1005

## 2023-07-09 NOTE — ED Triage Notes (Signed)
Pt c/o scratchy throat that comes and goes x 4 days. She also has drainage, a cough and right ear pain x 3 days.

## 2023-07-09 NOTE — Discharge Instructions (Signed)
Your symptoms today are most likely being caused by a virus and should steadily improve in time it can take up to 7 to 10 days before you truly start to see a turnaround however things will get better  There are no signs of bacterial infection to the ears or to the throat  May gargle and spit solution every 4 hours to provide temporary relief to your throat    You can take Tylenol and/or Ibuprofen as needed for fever reduction and pain relief.   For cough: honey 1/2 to 1 teaspoon (you can dilute the honey in water or another fluid).  You can also use guaifenesin and dextromethorphan for cough. You can use a humidifier for chest congestion and cough.  If you don't have a humidifier, you can sit in the bathroom with the hot shower running.      For sore throat: try warm salt water gargles, cepacol lozenges, throat spray, warm tea or water with lemon/honey, popsicles or ice, or OTC cold relief medicine for throat discomfort.   For congestion: take a daily anti-histamine like Zyrtec, Claritin, and a oral decongestant, such as pseudoephedrine.  You can also use Flonase 1-2 sprays in each nostril daily.   It is important to stay hydrated: drink plenty of fluids (water, gatorade/powerade/pedialyte, juices, or teas) to keep your throat moisturized and help further relieve irritation/discomfort.

## 2023-07-12 ENCOUNTER — Ambulatory Visit
Admission: EM | Admit: 2023-07-12 | Discharge: 2023-07-12 | Disposition: A | Discharge status: Home / Self Care | Payer: BC Managed Care – PPO | Attending: Emergency Medicine | Admitting: Emergency Medicine

## 2023-07-12 DIAGNOSIS — R051 Acute cough: Secondary | ICD-10-CM

## 2023-07-12 DIAGNOSIS — J0100 Acute maxillary sinusitis, unspecified: Secondary | ICD-10-CM | POA: Diagnosis not present

## 2023-07-12 MED ORDER — PROMETHAZINE-DM 6.25-15 MG/5ML PO SYRP: 5.0000 mL | ORAL_SOLUTION | Freq: Four times a day (QID) | ORAL | 0 refills | Status: DC | PRN

## 2023-07-12 MED ORDER — AMOXICILLIN 875 MG PO TABS: 875.0000 mg | ORAL_TABLET | Freq: Two times a day (BID) | ORAL | 0 refills | Status: AC

## 2023-07-12 NOTE — ED Triage Notes (Signed)
Patient to Urgent Care with complaints of sinus pain and pressure/ ear pain/ cough/ poor sleep/ drainage.   Reports symptoms started one week ago.  Using flonase/ lidocaine/ tylenol sinus/ alker-seltzer cold and flu/ nyquil with honey.

## 2023-07-12 NOTE — ED Provider Notes (Signed)
Monica [name]    CSN: [id number] Arrival date & time: 07/12/23  1758      History   Chief Complaint Chief Complaint  Patient presents with   Nasal Congestion    HPI Monica [name] is a 57 y.o. female.  Patient presents with 1 week history of sinus pressure, congestion, earache, postnasal drip, cough.  Cough is keeping her awake at night.  She has been treating her symptoms with OTC cold and sinus medications, Flonase nasal spray, viscous lidocaine without relief.  No fever, shortness of breath, or other symptoms.  Patient was seen at this urgent care on 07/09/2023; diagnosed with viral URI with cough; he did with viscous lidocaine.  The history is provided by the patient and medical records.    Past Medical History:  Diagnosis Date   Allergy    seasonal   Essential hypertension    History of chicken pox    Hyperbilirubinemia 01/19/2017   Hyperlipidemia    Hypothyroidism    Primary adenocarcinoma of upper lobe of right lung (HCC) 05/28/2016   RUL apex spiculated ~2cm nodule concerning by PET scan - referred to multidisciplinary thoracic oncology clinic S/p thoracoscopic RULobectomy Dorris Fetch) 06/2016   Sarcoidosis, lung (HCC) ~2004   treated with prendisone and stable since then    Patient Active Problem List   Diagnosis Date Noted   Elevated blood sugar level 06/17/2023   Cervical radiculopathy 08/21/2021   Vertigo 09/22/2020   Adenocarcinoma of right lung, stage 1 (HCC) 06/16/2020   RUQ abdominal pain 11/17/2018   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   HTN (hypertension) 12/30/2016   Hyperlipidemia 11/19/2016   Fever blister 11/19/2016   Chronic cough 08/17/2016   ETD (Eustachian tube dysfunction), right 08/17/2016   Globus sensation 07/09/2016   History of adenocarcinoma of lung 05/28/2016   Bilateral carpal tunnel syndrome 05/28/2016   Primary osteoarthritis of first carpometacarpal joint of right hand 05/28/2016   Trigger finger of right thumb 05/28/2016    Health maintenance examination 04/01/2015   Vitamin D deficiency 04/01/2015   Severe obesity (BMI 35.0-39.9) with comorbidity (HCC) 04/01/2015   History of sarcoidosis 10/10/2014   Hypothyroidism 10/10/2014    Past Surgical History:  Procedure Laterality Date   CARDIOVASCULAR STRESS TEST  12/2016   Nuclear CP stress test: hypertensive response to exercise, normal low risk study, EF 68%   COLONOSCOPY  09/2015   WNL Randa Evens)   COLONOSCOPY  11/2019   SSP, hemorrhoids, diverticulosis, rpt 7 yrs ([facility])   ENDOMETRIAL ABLATION W/ NOVASURE  01/2009   Hattie Perch 12/02/2010   ESOPHAGOGASTRODUODENOSCOPY  11/2019   LA Grade A reflux esophagitis, gastric ulcer, gastritis, HH ([facility])   LOBECTOMY Right 06/14/2016   RU Lobectomy; Surgeon: Loreli Slot, MD   LYMPH NODE DISSECTION Right 06/14/2016   RIGHT UPPER LOBE LYMPH NODE DISSECTION;  Surgeon: Loreli Slot, MD   REFRACTIVE SURGERY Bilateral 1998   TUBAL LIGATION  2001   VIDEO ASSISTED THORACOSCOPY (VATS)/WEDGE RESECTION Right 06/14/2016   Surgeon: Loreli Slot, MD    OB History   No obstetric history on file.      Home Medications    Prior to Admission medications   Medication Sig Start Date End Date Taking? Authorizing Provider  amoxicillin (AMOXIL) 875 MG tablet Take 1 tablet (875 mg total) by mouth 2 (two) times daily for 10 days. 07/12/23 07/22/23 Yes Mickie Bail, NP  promethazine-dextromethorphan (PROMETHAZINE-DM) 6.25-15 MG/5ML syrup Take 5 mLs by mouth 4 (four) times daily as needed.  07/12/23  Yes Mickie Bail, NP  acetaminophen (TYLENOL) 325 MG tablet Take 650 mg by mouth every 6 (six) hours as needed.    [provider]  albuterol (VENTOLIN HFA) 108 (90 Base) MCG/ACT inhaler Inhale 2 puffs into the lungs every 6 (six) hours as needed for wheezing or shortness of breath. 06/08/19   Eustaquio Boyden, MD  Cholecalciferol (VITAMIN D3) 50 MCG (2000 UT) capsule Take 2 capsules (4,000 Units  total) by mouth daily. 12/01/22   Eustaquio Boyden, MD  fluticasone [facility]) 50 MCG/ACT nasal spray Place 1 spray into both nostrils daily as needed for allergies. 12/31/16   Mikhail, Nita Sells, DO  hydrochlorothiazide (HYDRODIURIL) 12.5 MG tablet Take 1 tablet (12.5 mg total) by mouth daily. 11/30/22   Eustaquio Boyden, MD  levothyroxine (SYNTHROID) 75 MCG tablet TAKE 1 TABLET(75 MCG) BY MOUTH DAILY BEFORE BREAKFAST 11/30/22   Eustaquio Boyden, MD  lidocaine (XYLOCAINE) 2 % solution Use as directed 15 mLs in the mouth or throat as needed. 07/09/23   White, Elita Boone, NP  losartan (COZAAR) 50 MG tablet Take 1 tablet (50 mg total) by mouth daily. 11/30/22   Eustaquio Boyden, MD  Potassium 99 MG TABS Take by mouth daily.    [provider]  Red Yeast Rice 600 MG CAPS Take 1 capsule (600 mg total) by mouth in the morning and at bedtime. 08/21/21   Eustaquio Boyden, MD  Spacer/Aero-Holding Deretha [facility] DEVI Use as directed with albuterol inhaler 06/08/19   Eustaquio Boyden, MD  valACYclovir (VALTREX) 1000 MG tablet TAKE 2 TABLETS(2000 MG) BY MOUTH TWICE DAILY FOR 1 DAY AS NEEDED FOR FEVER BLISTER 07/05/23   Eustaquio Boyden, MD    Family History Family History  Problem Relation Age of Onset   Hypertension Mother    Breast cancer Mother    Prostate cancer Father 50   CAD Maternal Grandmother        MI   Stroke Maternal Grandmother    CAD Maternal Grandfather        MI   Stroke Paternal Grandmother    CAD Paternal Grandmother    Diabetes Paternal Grandfather    Colon cancer Neg Hx    Liver cancer Neg Hx    Colon polyps Neg Hx    Esophageal cancer Neg Hx    Rectal cancer Neg Hx    Stomach cancer Neg Hx     Social History Social History   Tobacco Use   Smoking status: Never   Smokeless tobacco: Never  Vaping Use   Vaping status: Never Used  Substance Use Topics   Alcohol use: No    Alcohol/week: 0.0 standard drinks of alcohol   Drug use: No     Allergies   Patient has no  known allergies.   Review of Systems Review of Systems  Constitutional:  Negative for chills and fever.  HENT:  Positive for congestion, ear pain, postnasal drip, rhinorrhea and sinus pressure. Negative for sore throat.   Respiratory:  Positive for cough. Negative for shortness of breath.   Cardiovascular:  Negative for chest pain and palpitations.     Physical Exam Triage Vital Signs ED Triage Vitals  Encounter Vitals Group     BP 07/12/23 1929 129/83     Systolic BP Percentile --      Diastolic BP Percentile --      Pulse Rate 07/12/23 1929 83     Resp 07/12/23 1929 18     Temp 07/12/23 1929 98.2 F (36.8 C)  Temp src --      SpO2 07/12/23 1929 98 %     Weight --      Height --      Head Circumference --      Peak Flow --      Pain Score 07/12/23 1928 4     Pain Loc --      Pain Education --      Exclude from Growth Chart --    No data found.  Updated Vital Signs BP 129/83   Pulse 83   Temp 98.2 F (36.8 C)   Resp 18   SpO2 98%   Visual Acuity Right Eye Distance:   Left Eye Distance:   Bilateral Distance:    Right Eye Near:   Left Eye Near:    Bilateral Near:     Physical Exam Constitutional:      General: She is not in acute distress. HENT:     Right Ear: Tympanic membrane normal.     Left Ear: Tympanic membrane normal.     Nose: Congestion and rhinorrhea present.     Mouth/Throat:     Mouth: Mucous membranes are moist.     Pharynx: Oropharynx is clear.  Cardiovascular:     Rate and Rhythm: Normal rate and regular rhythm.     Heart sounds: Normal heart sounds.  Pulmonary:     Effort: Pulmonary effort is normal. No respiratory distress.     Breath sounds: Normal breath sounds.  Skin:    General: Skin is warm and dry.  Neurological:     Mental Status: She is alert.      UC Treatments / Results  Labs (all labs ordered are listed, but only abnormal results are displayed) Labs Reviewed - No data to display  EKG   Radiology No  results found.  Procedures Procedures (including critical care time)  Medications Ordered in UC Medications - No data to display  Initial Impression / Assessment and Plan / UC Course  I have reviewed the triage vital signs and the nursing notes.  Pertinent labs & imaging results that were available during my care of the patient were reviewed by me and considered in my medical decision making (see chart for details).    Acute sinusitis, cough.  Afebrile and vital signs are stable.  Treating with amoxicillin.  Treating cough with Promethazine DM.  Precautions for drowsiness with promethazine discussed.  Education provided on sinus infection and cough.  Instructed patient to follow up with her PCP if her symptoms are not improving.  She agrees to plan of care.   Final Clinical Impressions(s) / UC Diagnoses   Final diagnoses:  Acute non-recurrent maxillary sinusitis  Acute cough     Discharge Instructions      Take the amoxicillin as directed.    Take the Promethazine DM as directed for cough.  Do not drive, operate machinery, drink alcohol, or perform dangerous activities while taking this medication as it may cause drowsiness.  Follow-up with your primary care provider if your symptoms are not improving.      ED Prescriptions     Medication Sig Dispense Auth. Provider   promethazine-dextromethorphan (PROMETHAZINE-DM) 6.25-15 MG/5ML syrup Take 5 mLs by mouth 4 (four) times daily as needed. 118 mL Mickie Bail, NP   amoxicillin (AMOXIL) 875 MG tablet Take 1 tablet (875 mg total) by mouth 2 (two) times daily for 10 days. 20 tablet Mickie Bail, NP      PDMP  not reviewed this encounter.   Mickie Bail, NP 07/12/23 2005

## 2023-07-12 NOTE — Discharge Instructions (Addendum)
Take the amoxicillin as directed.    Take the Promethazine DM as directed for cough.  Do not drive, operate machinery, drink alcohol, or perform dangerous activities while taking this medication as it may cause drowsiness.  Follow-up with your primary care provider if your symptoms are not improving.

## 2023-11-18 ENCOUNTER — Other Ambulatory Visit: Payer: BC Managed Care – PPO

## 2023-11-25 ENCOUNTER — Other Ambulatory Visit

## 2023-12-02 ENCOUNTER — Encounter: Payer: BC Managed Care – PPO | Admitting: Family Medicine

## 2023-12-14 ENCOUNTER — Ambulatory Visit: Payer: Self-pay

## 2023-12-14 NOTE — Telephone Encounter (Signed)
  Chief Complaint: Acid reflux/chest discomfort Symptoms: see notes Frequency: since Saturday Pertinent Negatives: Patient denies Chest pain, back pain, arm pain, jaw pain Disposition: [] ED /[] Urgent Care (no appt availability in office) / [x] Appointment(In office/virtual)/ []  [facility]/ [] Home Care/ [] Refused Recommended Disposition /[] [facility]/ []  Follow-up with PCP Additional Notes: Patient called in stating she has been mildly concern because she has had acid reflux/burning in chest intermittently since Saturday. Patient states that she normally only has acid reflux once a year. Patient states it has happened intermittently, and yesterday she had one episode of getting very sweaty and nervous. Patient states her BP has been running a little high on the diastolic numbers, mostly in the 90s. Patient educated on s/s to be aware of to call 911. Patient appt made for tomorrow.    Copied from CRM [id number]. Topic: Clinical - Red Word Triage >> Dec 14, 2023  4:42 PM Juleen Oakland F wrote: Red Word that prompted transfer to Nurse Triage: Patient having heart burn, blood pressure reading today 113/95 and 126/90 and she is concerned since she's never had heart burn before Reason for Disposition  [1] Chest pain lasts > 5 minutes AND [2] occurred > 3 days ago (72 hours) AND [3] NO chest pain or cardiac symptoms now  Answer Assessment - Initial Assessment Questions 1. LOCATION: "Where does it hurt?"       Ver 2. RADIATION: "Does the pain go anywhere else?" (e.g., into neck, jaw, arms, back)     No 3. ONSET: "When did the chest pain begin?" (Minutes, hours or days)      Saturday 5. DURATION: "How long does it last" (e.g., seconds, minutes, hours)     Pattern  6. SEVERITY: "How bad is the pain?"  (e.g., Scale 1-10; mild, moderate, or severe)    - MILD (1-3): doesn't interfere with normal activities     - MODERATE (4-7): interferes with normal activities or awakens from sleep    -  SEVERE (8-10): excruciating pain, unable to do any normal activities       See notes 7. CARDIAC RISK FACTORS: "Do you have any history of heart problems or risk factors for heart disease?" (e.g., angina, prior heart attack; diabetes, high blood pressure, high cholesterol, smoker, or strong family history of heart disease)     HTN 8. PULMONARY RISK FACTORS: "Do you have any history of lung disease?"  (e.g., blood clots in lung, asthma, emphysema, birth control pills)     Removal of adenocarcinoma of right lung 9. CAUSE: "What do you think is causing the chest pain?"     unsure 10. OTHER SYMPTOMS: "Do you have any other symptoms?" (e.g., dizziness, nausea, vomiting, sweating, fever, difficulty breathing, cough)       1 episode of sweating,  Protocols used: Chest Pain-A-AH

## 2023-12-15 ENCOUNTER — Ambulatory Visit: Admitting: Family Medicine

## 2023-12-15 ENCOUNTER — Encounter: Payer: Self-pay | Admitting: Family Medicine

## 2023-12-15 VITALS — BP 118/88 | HR 85 | Temp 98.3°F | Ht 61.0 in | Wt 192.0 lb

## 2023-12-15 DIAGNOSIS — I10 Essential (primary) hypertension: Secondary | ICD-10-CM

## 2023-12-15 DIAGNOSIS — R0789 Other chest pain: Secondary | ICD-10-CM | POA: Diagnosis not present

## 2023-12-15 MED ORDER — PANTOPRAZOLE SODIUM 40 MG PO TBEC: 40.0000 mg | DELAYED_RELEASE_TABLET | Freq: Every day | ORAL | 0 refills | Status: DC

## 2023-12-15 NOTE — Progress Notes (Signed)
 Patient ID: Monica [name], female    DOB: 1965/10/02, 58 y.o.   MRN: [id number]  This visit was conducted in person.  BP 118/88 (BP Location: Left Arm, Patient Position: Sitting, Cuff Size: Normal)   Pulse 85   Temp 98.3 F (36.8 C) (Oral)   Ht 5\' 1"  (1.549 m)   Wt 192 lb (87.1 kg)   SpO2 98%   BMI 36.28 kg/m    CC:  Chief Complaint  Patient presents with   Hypertension    Noticed her BP was up on Sunday and has been keeping on eye on it since; diastolic has been elevated. Did get like a hot flash and shakiness on Tuesday and BP was up some.    Gastroesophageal Reflux    On and off since Sunday along with the BP issues. Took protonix  in the past but hasn't taken in couple years.     Subjective:   HPI: Monica [name] is a 58 y.o. female patient of Dr. Mariam Shingles presenting on 12/15/2023 for Hypertension (Noticed her BP was up on Sunday and has been keeping on eye on it since; diastolic has been elevated. Did get like a hot flash and shakiness on Tuesday and BP was up some. ) and Gastroesophageal Reflux (On and off since Sunday along with the BP issues. Took protonix  in the past but hasn't taken in couple years. )   Has history of HTN on hydrochlorothiazide  12.5 mg daily , losartan  50 mg daily BP Readings from Last 3 Encounters:  12/15/23 118/88  07/12/23 129/83  07/09/23 113/76    She has noted BP has been up in last 5 days... 119/93, 120/90. 128/89 Yesterday 125/93, 114/86 She has recently been using claritin and tylenol ... felt hot and hands shaky.. felt better after eating.    She hs had reflux at the same time in last several days.... feel burning in throat and in chest centrally, non exertional... intermittent,  no SOB. She has had [place] and pizza prior to issue.  In past used pantoprazole  but has not used in years.  No change after eating.  Has history of GERD, PUD       Relevant past medical, surgical, family and social history reviewed and updated as  indicated. Interim medical history since our last visit reviewed. Allergies and medications reviewed and updated. Outpatient Medications Prior to Visit  Medication Sig Dispense Refill   acetaminophen  (TYLENOL ) 325 MG tablet Take 650 mg by mouth every 6 (six) hours as needed.     albuterol  (VENTOLIN  HFA) 108 (90 Base) MCG/ACT inhaler Inhale 2 puffs into the lungs every 6 (six) hours as needed for wheezing or shortness of breath. 18 g 3   Cholecalciferol  (VITAMIN D3) 50 MCG (2000 UT) capsule Take 2 capsules (4,000 Units total) by mouth daily.     fluticasone  (FLONASE ) 50 MCG/ACT nasal spray Place 1 spray into both nostrils daily as needed for allergies. 16 g 0   hydrochlorothiazide  (HYDRODIURIL ) 12.5 MG tablet Take 1 tablet (12.5 mg total) by mouth daily. 90 tablet 4   levothyroxine  (SYNTHROID ) 75 MCG tablet TAKE 1 TABLET(75 MCG) BY MOUTH DAILY BEFORE BREAKFAST 90 tablet 4   lidocaine  (XYLOCAINE ) 2 % solution Use as directed 15 mLs in the mouth or throat as needed. 100 mL 0   losartan  (COZAAR ) 50 MG tablet Take 1 tablet (50 mg total) by mouth daily. 90 tablet 4   Potassium 99 MG TABS Take by mouth daily.  Red Yeast Rice 600 MG CAPS Take 1 capsule (600 mg total) by mouth in the morning and at bedtime.     Spacer/Aero-Holding Ismael Maria Use as directed with albuterol  inhaler 1 Units 0   valACYclovir  (VALTREX ) 1000 MG tablet TAKE 2 TABLETS(2000 MG) BY MOUTH TWICE DAILY FOR 1 DAY AS NEEDED FOR FEVER BLISTER 12 tablet 1   promethazine -dextromethorphan (PROMETHAZINE -DM) 6.25-15 MG/5ML syrup Take 5 mLs by mouth 4 (four) times daily as needed. (Patient not taking: Reported on 12/15/2023) 118 mL 0   No facility-administered medications prior to visit.     Per HPI unless specifically indicated in ROS section below Review of Systems  Constitutional:  Negative for fatigue and fever.  HENT:  Negative for congestion.   Eyes:  Negative for pain.  Respiratory:  Negative for cough and shortness of  breath.   Cardiovascular:  Positive for chest pain. Negative for palpitations and leg swelling.  Gastrointestinal:  Negative for abdominal pain.  Genitourinary:  Negative for dysuria and vaginal bleeding.  Musculoskeletal:  Negative for back pain.  Neurological:  Negative for syncope, light-headedness and headaches.  Psychiatric/Behavioral:  Negative for dysphoric mood.    Objective:  BP 118/88 (BP Location: Left Arm, Patient Position: Sitting, Cuff Size: Normal)   Pulse 85   Temp 98.3 F (36.8 C) (Oral)   Ht 5\' 1"  (1.549 m)   Wt 192 lb (87.1 kg)   SpO2 98%   BMI 36.28 kg/m   Wt Readings from Last 3 Encounters:  12/15/23 192 lb (87.1 kg)  06/17/23 195 lb 4 oz (88.6 kg)  06/14/23 197 lb (89.4 kg)      Physical Exam Constitutional:      General: She is not in acute distress.    Appearance: Normal appearance. She is well-developed. She is not ill-appearing or toxic-appearing.  HENT:     Head: Normocephalic.     Right Ear: Hearing, tympanic membrane, ear canal and external ear normal. Tympanic membrane is not erythematous, retracted or bulging.     Left Ear: Hearing, tympanic membrane, ear canal and external ear normal. Tympanic membrane is not erythematous, retracted or bulging.     Nose: No mucosal edema or rhinorrhea.     Right Sinus: No maxillary sinus tenderness or frontal sinus tenderness.     Left Sinus: No maxillary sinus tenderness or frontal sinus tenderness.     Mouth/Throat:     Pharynx: Uvula midline.  Eyes:     General: Lids are normal. Lids are everted, no foreign bodies appreciated.     Conjunctiva/sclera: Conjunctivae normal.     Pupils: Pupils are equal, round, and reactive to light.  Neck:     Thyroid : No thyroid  mass or thyromegaly.     Vascular: No carotid bruit.     Trachea: Trachea normal.  Cardiovascular:     Rate and Rhythm: Normal rate and regular rhythm.     Pulses: Normal pulses.     Heart sounds: Normal heart sounds, S1 normal and S2 normal. No  murmur heard.    No friction rub. No gallop.  Pulmonary:     Effort: Pulmonary effort is normal. No tachypnea or respiratory distress.     Breath sounds: Normal breath sounds. No decreased breath sounds, wheezing, rhonchi or rales.  Chest:     Chest wall: No tenderness or edema. There is no dullness to percussion.  Abdominal:     General: Bowel sounds are normal.     Palpations: Abdomen is soft.  Tenderness: There is no abdominal tenderness.  Musculoskeletal:     Cervical back: Normal range of motion and neck supple.  Lymphadenopathy:     Upper Body:     Right upper body: No supraclavicular adenopathy.     Left upper body: No supraclavicular adenopathy.  Skin:    General: Skin is warm and dry.     Findings: No rash.  Neurological:     Mental Status: She is alert.  Psychiatric:        Mood and Affect: Mood is not anxious or depressed.        Speech: Speech normal.        Behavior: Behavior normal. Behavior is cooperative.        Thought Content: Thought content normal.        Judgment: Judgment normal.       Results for orders placed or performed in visit on 06/17/23  POCT glycosylated hemoglobin (Hb A1C)   Collection Time: 06/17/23 12:01 PM  Result Value Ref Range   Hemoglobin A1C 5.5 4.0 - 5.6 %   HbA1c POC (<> result, manual entry)     HbA1c, POC (prediabetic range)     HbA1c, POC (controlled diabetic range)      Assessment and Plan  Chest pressure Assessment & Plan: Acute, symptoms most consistent with gastroesophageal reflux versus gastritis versus esophageal spasm.  There is no exertional component but given associated elevated blood pressure and patient's comorbidities will evaluate with EKG.  EKG: Normal sinus rhythm with no specific T wave changes, new Q waves, no LVH, stable from previous  Symptoms most likely from reflux so we will have patient avoid acid triggers and she will restart pantoprazole  40 mg daily.  If this is effective she will wean off of it  after 4 to 6 weeks.  If not improving she will follow-up with her PCP.  Return and ER precautions provided  Orders: -     EKG 12-Lead  Primary hypertension Assessment & Plan: Acute, mild diastolic blood pressure elevation likely due to increased stress, anxiety and possible increased salt intake.  If blood pressure remaining elevated even after chest pressure reflux symptoms resolved, consider increasing HCTZ to 25 mg daily.   Return and ER precautions provided   Other orders -     Pantoprazole  Sodium; Take 1 tablet (40 mg total) by mouth daily.  Dispense: 30 tablet; Refill: 0    No follow-ups on file.   Herby Lolling, MD

## 2023-12-15 NOTE — Telephone Encounter (Signed)
 Noted. Looks like pt is seeing Dr Cherlyn Cornet today at 12:00.

## 2023-12-15 NOTE — Assessment & Plan Note (Signed)
 Acute, symptoms most consistent with gastroesophageal reflux versus gastritis versus esophageal spasm.  There is no exertional component but given associated elevated blood pressure and patient's comorbidities will evaluate with EKG.  EKG: Normal sinus rhythm with no specific T wave changes, new Q waves, no LVH, stable from previous  Symptoms most likely from reflux so we will have patient avoid acid triggers and she will restart pantoprazole  40 mg daily.  If this is effective she will wean off of it after 4 to 6 weeks.  If not improving she will follow-up with her PCP.  Return and ER precautions provided

## 2023-12-15 NOTE — Patient Instructions (Addendum)
 Avoid acid triggers.  Start pantoprazole  for 4-6 weeks then wean off.  Follow BP at home 1-2 times a week.  If persistently elevated, > 140/90, increase hydrochlorothiazide   to 25 mg daily.

## 2023-12-15 NOTE — Assessment & Plan Note (Signed)
 Acute, mild diastolic blood pressure elevation likely due to increased stress, anxiety and possible increased salt intake.  If blood pressure remaining elevated even after chest pressure reflux symptoms resolved, consider increasing HCTZ to 25 mg daily.   Return and ER precautions provided

## 2023-12-28 ENCOUNTER — Other Ambulatory Visit: Payer: Self-pay | Admitting: Family Medicine

## 2023-12-28 DIAGNOSIS — I10 Essential (primary) hypertension: Secondary | ICD-10-CM

## 2024-01-07 ENCOUNTER — Other Ambulatory Visit: Payer: Self-pay | Admitting: Family Medicine

## 2024-01-07 DIAGNOSIS — E039 Hypothyroidism, unspecified: Secondary | ICD-10-CM

## 2024-02-26 ENCOUNTER — Other Ambulatory Visit: Payer: Self-pay | Admitting: Family Medicine

## 2024-02-26 DIAGNOSIS — E039 Hypothyroidism, unspecified: Secondary | ICD-10-CM

## 2024-02-26 DIAGNOSIS — R739 Hyperglycemia, unspecified: Secondary | ICD-10-CM

## 2024-02-26 DIAGNOSIS — E559 Vitamin D deficiency, unspecified: Secondary | ICD-10-CM

## 2024-02-26 DIAGNOSIS — E7800 Pure hypercholesterolemia, unspecified: Secondary | ICD-10-CM

## 2024-03-02 ENCOUNTER — Other Ambulatory Visit (INDEPENDENT_AMBULATORY_CARE_PROVIDER_SITE_OTHER)

## 2024-03-02 DIAGNOSIS — E039 Hypothyroidism, unspecified: Secondary | ICD-10-CM

## 2024-03-02 DIAGNOSIS — E7800 Pure hypercholesterolemia, unspecified: Secondary | ICD-10-CM | POA: Diagnosis not present

## 2024-03-02 DIAGNOSIS — E559 Vitamin D deficiency, unspecified: Secondary | ICD-10-CM | POA: Diagnosis not present

## 2024-03-02 DIAGNOSIS — R739 Hyperglycemia, unspecified: Secondary | ICD-10-CM

## 2024-03-02 LAB — COMPREHENSIVE METABOLIC PANEL WITH GFR
ALT: 12 U/L (ref 0–35)
AST: 13 U/L (ref 0–37)
Albumin: 4.4 g/dL (ref 3.5–5.2)
Alkaline Phosphatase: 74 U/L (ref 39–117)
BUN: 17 mg/dL (ref 6–23)
CO2: 31 meq/L (ref 19–32)
Calcium: 9.6 mg/dL (ref 8.4–10.5)
Chloride: 101 meq/L (ref 96–112)
Creatinine, Ser: 0.78 mg/dL (ref 0.40–1.20)
GFR: 83.07 mL/min (ref >60.00)
Glucose, Bld: 106 mg/dL — ABNORMAL HIGH (ref 70–99)
Potassium: 5 meq/L (ref 3.5–5.1)
Sodium: 140 meq/L (ref 135–145)
Total Bilirubin: 1.5 mg/dL — ABNORMAL HIGH (ref 0.2–1.2)
Total Protein: 7 g/dL (ref 6.0–8.3)

## 2024-03-02 LAB — VITAMIN D 25 HYDROXY (VIT D DEFICIENCY, FRACTURES): VITD: 48.36 ng/mL (ref 30.00–100.00)

## 2024-03-02 LAB — TSH: TSH: 2.99 u[IU]/mL (ref 0.35–5.50)

## 2024-03-02 LAB — LIPID PANEL
Cholesterol: 235 mg/dL — ABNORMAL HIGH (ref 0–200)
HDL: 63.3 mg/dL (ref >39.00)
LDL Cholesterol: 155 mg/dL — ABNORMAL HIGH (ref 0–99)
NonHDL: 171.64
Total CHOL/HDL Ratio: 4
Triglycerides: 85 mg/dL (ref 0.0–149.0)
VLDL: 17 mg/dL (ref 0.0–40.0)

## 2024-03-02 LAB — HEMOGLOBIN A1C: Hgb A1c MFr Bld: 5.7 % (ref 4.6–6.5)

## 2024-03-03 ENCOUNTER — Ambulatory Visit: Payer: Self-pay | Admitting: Family Medicine

## 2024-03-09 ENCOUNTER — Encounter: Payer: Self-pay | Admitting: Family Medicine

## 2024-03-09 ENCOUNTER — Ambulatory Visit (INDEPENDENT_AMBULATORY_CARE_PROVIDER_SITE_OTHER): Admitting: Family Medicine

## 2024-03-09 VITALS — BP 124/72 | HR 64 | Temp 97.9°F | Ht 61.0 in | Wt 193.1 lb

## 2024-03-09 DIAGNOSIS — E7800 Pure hypercholesterolemia, unspecified: Secondary | ICD-10-CM | POA: Diagnosis not present

## 2024-03-09 DIAGNOSIS — Z0000 Encounter for general adult medical examination without abnormal findings: Secondary | ICD-10-CM | POA: Diagnosis not present

## 2024-03-09 DIAGNOSIS — Z23 Encounter for immunization: Secondary | ICD-10-CM

## 2024-03-09 DIAGNOSIS — E039 Hypothyroidism, unspecified: Secondary | ICD-10-CM | POA: Diagnosis not present

## 2024-03-09 DIAGNOSIS — Z85118 Personal history of other malignant neoplasm of bronchus and lung: Secondary | ICD-10-CM

## 2024-03-09 DIAGNOSIS — E559 Vitamin D deficiency, unspecified: Secondary | ICD-10-CM

## 2024-03-09 DIAGNOSIS — R7303 Prediabetes: Secondary | ICD-10-CM

## 2024-03-09 DIAGNOSIS — I10 Essential (primary) hypertension: Secondary | ICD-10-CM

## 2024-03-09 MED ORDER — PANTOPRAZOLE SODIUM 40 MG PO TBEC: 40.0000 mg | DELAYED_RELEASE_TABLET | Freq: Every day | ORAL | 3 refills | Status: DC | PRN

## 2024-03-09 MED ORDER — LEVOTHYROXINE SODIUM 75 MCG PO TABS: ORAL_TABLET | ORAL | 0 refills | Status: DC

## 2024-03-09 MED ORDER — VALACYCLOVIR HCL 1 G PO TABS: 2000.0000 mg | ORAL_TABLET | Freq: Two times a day (BID) | ORAL | 1 refills | Status: AC

## 2024-03-09 MED ORDER — LOSARTAN POTASSIUM-HCTZ 50-12.5 MG PO TABS: 1.0000 | ORAL_TABLET | Freq: Every day | ORAL | 3 refills | Status: AC

## 2024-03-09 NOTE — Assessment & Plan Note (Signed)
 Chronic, stable. Continue current regimen.

## 2024-03-09 NOTE — Assessment & Plan Note (Signed)
 Preventative protocols reviewed and updated unless pt declined. Discussed healthy diet and lifestyle.

## 2024-03-09 NOTE — Assessment & Plan Note (Signed)
 Chronic, stable. Continue current regimen. Change to combo pill hyzaar. Stop extra potassium supplement 99mg  given high normal K levels

## 2024-03-09 NOTE — Patient Instructions (Addendum)
 Prevnar-20 today May start combo pill losartan  hydrochlorothiazide  - sent to pharmacy.  Good to see you today  Return as needed or in 1 year for next physical.   Local [facility] may offer a Diabetes Prevention Program - if interested, you may go to below website to learn more or sign up for the program: [email or web address]

## 2024-03-09 NOTE — Progress Notes (Signed)
 Ph: [phone] Fax: [phone]   Patient ID: Monica [name], female    DOB: 11-02-1965, 58 y.o.   MRN: [id number]  This visit was conducted in person.  BP 124/72   Pulse 64   Temp 97.9 F (36.6 C) (Oral)   Ht 5' 1 (1.549 m)   Wt 193 lb 2 oz (87.6 kg)   SpO2 100%   BMI 36.49 kg/m    CC: CPE Subjective:   HPI: Monica [name] is a 58 y.o. female presenting on 03/09/2024 for Annual Exam   Stage IA lung adenocarcinoma s/p thoracoscopic RU lobectomy 06/2016 - incidentally found on CXR for sarcoidosis. Adjuvant chemo was not recommended. Followed regularly by onc [facility]).  She started biotin OTC supplement a few months ago.   Notes cramping to toes or arch of foot at night, occ at calf.   Preventative: EGD 11/2019 - LA grade A reflux esophagitis, gastritis, gastric ulcer, rec PPI BID x 8 wks ([facility]) Colonoscopy 11/2019 - SSP, hemorrhoids, diverticulosis, rpt 7 yrs ([facility]) Well woman yearly with GYN Dr Leva, upcoming appt 03/2024.  Mammo yearly Q6 mo alternating with breast MRI through OBGYN - latest breast MRI Birads1 06/2023. High breast cancer lifetime risk >20%.  LMP 2010 s/p endometrial ablation, h/o polyps  BRCA gene negative Lung cancer screen - not eligible  Prevnar-20 today  Flu shot yearly  COVID vaccine Moderna 08/2019, 09/2019, booster 07/2020, 01/2021, bivalent booster 07/2021 Tdap 10/2016  Shingrix - 03/2020, 06/2020  Seat belt use discussed  Sunscreen use discussed. No changing moles on skin. Sees derm regularly.  Non smoker  Alcohol - none  Eye exam - yearly Dentist - Q6 months  Bowel - no constipation    Lives with husband (disabled) and son, 1 cat and dog Occupation: Designer, industrial/product with [employer]  Activity: has started walking 1 mile daily  Diet: good water, fruits/vegetables daily, limiting sweets/sweetened beverages     Relevant past medical, surgical, family and social history reviewed and updated as indicated. Interim  medical history since our last visit reviewed. Allergies and medications reviewed and updated. Outpatient Medications Prior to Visit  Medication Sig Dispense Refill   acetaminophen  (TYLENOL ) 325 MG tablet Take 650 mg by mouth every 6 (six) hours as needed.     albuterol  (VENTOLIN  HFA) 108 (90 Base) MCG/ACT inhaler Inhale 2 puffs into the lungs every 6 (six) hours as needed for wheezing or shortness of breath. 18 g 3   Cholecalciferol  (VITAMIN D3) 50 MCG (2000 UT) capsule Take 2 capsules (4,000 Units total) by mouth daily.     fluticasone  (FLONASE ) 50 MCG/ACT nasal spray Place 1 spray into both nostrils daily as needed for allergies. 16 g 0   loratadine (CLARITIN) 10 MG tablet 1 tablet Orally as needed     Red Yeast Rice 600 MG CAPS Take 1 capsule (600 mg total) by mouth in the morning and at bedtime.     Spacer/Aero-Holding Raguel FRENCH Use as directed with albuterol  inhaler 1 Units 0   Cholecalciferol  1.25 MG (50000 UT) TABS as directed Orally Once a day; Duration: 30 days     hydrochlorothiazide  (HYDRODIURIL ) 12.5 MG tablet TAKE 1 TABLET(12.5 MG) BY MOUTH DAILY 90 tablet 0   levothyroxine  (SYNTHROID ) 75 MCG tablet TAKE 1 TABLET(75 MCG) BY MOUTH DAILY BEFORE BREAKFAST 90 tablet 0   losartan  (COZAAR ) 50 MG tablet TAKE 1 TABLET(50 MG) BY MOUTH DAILY 90 tablet 0   pantoprazole  (PROTONIX ) 40 MG tablet Take 1 tablet (  40 mg total) by mouth daily. 30 tablet 0   Potassium 99 MG TABS Take by mouth daily.     valACYclovir  (VALTREX ) 1000 MG tablet TAKE 2 TABLETS(2000 MG) BY MOUTH TWICE DAILY FOR 1 DAY AS NEEDED FOR FEVER BLISTER 12 tablet 1   lidocaine  (XYLOCAINE ) 2 % solution Use as directed 15 mLs in the mouth or throat as needed. 100 mL 0   No facility-administered medications prior to visit.     Per HPI unless specifically indicated in ROS section below Review of Systems  Constitutional:  Negative for activity change, appetite change, chills, fatigue, fever and unexpected weight change.  HENT:   Negative for hearing loss.   Eyes:  Negative for visual disturbance.  Respiratory:  Negative for cough, chest tightness, shortness of breath and wheezing.   Cardiovascular:  Negative for chest pain, palpitations and leg swelling.  Gastrointestinal:  Negative for abdominal distention, abdominal pain, blood in stool, constipation, diarrhea, nausea and vomiting.  Genitourinary:  Negative for difficulty urinating and hematuria.  Musculoskeletal:  Negative for arthralgias, myalgias and neck pain.  Skin:  Negative for rash.  Neurological:  Negative for dizziness, seizures, syncope and headaches.  Hematological:  Negative for adenopathy. Does not bruise/bleed easily.  Psychiatric/Behavioral:  Negative for dysphoric mood. The patient is not nervous/anxious.     Objective:  BP 124/72   Pulse 64   Temp 97.9 F (36.6 C) (Oral)   Ht 5' 1 (1.549 m)   Wt 193 lb 2 oz (87.6 kg)   SpO2 100%   BMI 36.49 kg/m   Wt Readings from Last 3 Encounters:  03/09/24 193 lb 2 oz (87.6 kg)  12/15/23 192 lb (87.1 kg)  06/17/23 195 lb 4 oz (88.6 kg)      Physical Exam Vitals and nursing note reviewed.  Constitutional:      Appearance: Normal appearance. She is not ill-appearing.  HENT:     Head: Normocephalic and atraumatic.     Right Ear: Tympanic membrane, ear canal and external ear normal. There is no impacted cerumen.     Left Ear: Tympanic membrane, ear canal and external ear normal. There is no impacted cerumen.     Mouth/Throat:     Mouth: Mucous membranes are moist.     Pharynx: Oropharynx is clear. No oropharyngeal exudate or posterior oropharyngeal erythema.  Eyes:     General:        Right eye: No discharge.        Left eye: No discharge.     Extraocular Movements: Extraocular movements intact.     Conjunctiva/sclera: Conjunctivae normal.     Pupils: Pupils are equal, round, and reactive to light.  Neck:     Thyroid : No thyroid  mass or thyromegaly.  Cardiovascular:     Rate and Rhythm:  Normal rate and regular rhythm.     Pulses: Normal pulses.     Heart sounds: Normal heart sounds. No murmur heard. Pulmonary:     Effort: Pulmonary effort is normal. No respiratory distress.     Breath sounds: Normal breath sounds. No wheezing, rhonchi or rales.  Abdominal:     General: Bowel sounds are normal. There is no distension.     Palpations: Abdomen is soft. There is no mass.     Tenderness: There is no abdominal tenderness. There is no guarding or rebound.     Hernia: No hernia is present.  Musculoskeletal:     Cervical back: Normal range of motion and neck supple.  No rigidity.     Right lower leg: No edema.     Left lower leg: No edema.  Lymphadenopathy:     Cervical: No cervical adenopathy.  Skin:    General: Skin is warm and dry.     Findings: No rash.  Neurological:     General: No focal deficit present.     Mental Status: She is alert. Mental status is at baseline.  Psychiatric:        Mood and Affect: Mood normal.        Behavior: Behavior normal.       Results for orders placed or performed in visit on 03/02/24  Hemoglobin A1c   Collection Time: 03/02/24  8:13 AM  Result Value Ref Range   Hgb A1c MFr Bld 5.7 4.6 - 6.5 %  TSH   Collection Time: 03/02/24  8:13 AM  Result Value Ref Range   TSH 2.99 0.35 - 5.50 uIU/mL  VITAMIN D  25 Hydroxy (Vit-D Deficiency, Fractures)   Collection Time: 03/02/24  8:13 AM  Result Value Ref Range   VITD 48.36 30.00 - 100.00 ng/mL  Comprehensive metabolic panel with GFR   Collection Time: 03/02/24  8:13 AM  Result Value Ref Range   Sodium 140 135 - 145 mEq/L   Potassium 5.0 3.5 - 5.1 mEq/L   Chloride 101 96 - 112 mEq/L   CO2 31 19 - 32 mEq/L   Glucose, Bld 106 (H) 70 - 99 mg/dL   BUN 17 6 - 23 mg/dL   Creatinine, Ser 9.21 0.40 - 1.20 mg/dL   Total Bilirubin 1.5 (H) 0.2 - 1.2 mg/dL   Alkaline Phosphatase 74 39 - 117 U/L   AST 13 0 - 37 U/L   ALT 12 0 - 35 U/L   Total Protein 7.0 6.0 - 8.3 g/dL   Albumin 4.4 3.5 - 5.2  g/dL   GFR 16.92 >39.99 mL/min   Calcium  9.6 8.4 - 10.5 mg/dL  Lipid panel   Collection Time: 03/02/24  8:13 AM  Result Value Ref Range   Cholesterol 235 (H) 0 - 200 mg/dL   Triglycerides 14.9 0.0 - 149.0 mg/dL   HDL 36.69 >60.99 mg/dL   VLDL 82.9 0.0 - 59.9 mg/dL   LDL Cholesterol 844 (H) 0 - 99 mg/dL   Total CHOL/HDL Ratio 4    NonHDL 171.64     Assessment & Plan:   Problem List Items Addressed This Visit     Health maintenance examination - Primary (Chronic)   Preventative protocols reviewed and updated unless pt declined. Discussed healthy diet and lifestyle.       Hypothyroidism   Chronic, stable. Continue current regimen.       Relevant Medications   levothyroxine  (SYNTHROID ) 75 MCG tablet   Vitamin D  deficiency   Great control on vit D 4000 units daily - continue this.       Severe obesity (BMI 35.0-39.9) with comorbidity (HCC)   Continue to encourage healthy diet and lifestyle choices to affect sustainable weight loss.       History of adenocarcinoma of lung   Continue yearly oncology f/u. Appreciate their care.       Hyperlipidemia   Chronic off statin, but continues RYR. Reviewed ASCVD risk.  Encouraged ongoing efforts for cholesterol control.  The 10-year ASCVD risk score (Arnett DK, et al., 2019) is: 3.9%   Values used to calculate the score:     Age: 34 years     Clincally relevant sex:  Female     Is Non-Hispanic African American: No     Diabetic: No     Tobacco smoker: No     Systolic Blood Pressure: 124 mmHg     Is BP treated: Yes     HDL Cholesterol: 63.3 mg/dL     Total Cholesterol: 235 mg/dL       Relevant Medications   losartan -hydrochlorothiazide  (HYZAAR) 50-12.5 MG tablet   HTN (hypertension)   Chronic, stable. Continue current regimen. Change to combo pill hyzaar. Stop extra potassium supplement 99mg  given high normal K levels      Relevant Medications   losartan -hydrochlorothiazide  (HYZAAR) 50-12.5 MG tablet   Prediabetes    Reviewed limiting added sugar in diet.  Provided with DPP info through local [facility]      Other Visit Diagnoses       Need for vaccination against Streptococcus pneumoniae       Relevant Orders   Pneumococcal conjugate vaccine 20-valent (Prevnar 20) (Completed)        Meds ordered this encounter  Medications   levothyroxine  (SYNTHROID ) 75 MCG tablet    Sig: TAKE 1 TABLET(75 MCG) BY MOUTH DAILY BEFORE BREAKFAST    Dispense:  90 tablet    Refill:  0   pantoprazole  (PROTONIX ) 40 MG tablet    Sig: Take 1 tablet (40 mg total) by mouth daily as needed (reflux).    Dispense:  30 tablet    Refill:  3   valACYclovir  (VALTREX ) 1000 MG tablet    Sig: Take 2 tablets (2,000 mg total) by mouth in the morning and at bedtime. For 1 day per flare    Dispense:  12 tablet    Refill:  1   losartan -hydrochlorothiazide  (HYZAAR) 50-12.5 MG tablet    Sig: Take 1 tablet by mouth daily.    Dispense:  90 tablet    Refill:  3    Orders Placed This Encounter  Procedures   Pneumococcal conjugate vaccine 20-valent (Prevnar 20)    Patient Instructions  Prevnar-20 today May start combo pill losartan  hydrochlorothiazide  - sent to pharmacy.  Good to see you today  Return as needed or in 1 year for next physical.   Local [facility] may offer a Diabetes Prevention Program - if interested, you may go to below website to learn more or sign up for the program: [email or web address]    Follow up plan: Return in about 1 year (around 03/09/2025) for annual exam, prior fasting for blood work.  Anton Blas, MD

## 2024-03-09 NOTE — Assessment & Plan Note (Addendum)
 Reviewed limiting added sugar in diet.  Provided with DPP info through local [facility] [facility]

## 2024-03-09 NOTE — Assessment & Plan Note (Signed)
 Continue to encourage healthy diet and lifestyle choices to affect sustainable weight loss.

## 2024-03-09 NOTE — Assessment & Plan Note (Signed)
 Continue yearly oncology f/u. Appreciate their care.

## 2024-03-09 NOTE — Assessment & Plan Note (Addendum)
 Great control on vit D 4000 units daily - continue this.

## 2024-03-09 NOTE — Assessment & Plan Note (Signed)
 Chronic off statin, but continues RYR. Reviewed ASCVD risk.  Encouraged ongoing efforts for cholesterol control.  The 10-year ASCVD risk score (Arnett DK, et al., 2019) is: 3.9%   Values used to calculate the score:     Age: 58 years     Clincally relevant sex: Female     Is Non-Hispanic African American: No     Diabetic: No     Tobacco smoker: No     Systolic Blood Pressure: 124 mmHg     Is BP treated: Yes     HDL Cholesterol: 63.3 mg/dL     Total Cholesterol: 235 mg/dL

## 2024-03-28 ENCOUNTER — Other Ambulatory Visit: Payer: Self-pay | Admitting: Obstetrics and Gynecology

## 2024-03-28 DIAGNOSIS — Z803 Family history of malignant neoplasm of breast: Secondary | ICD-10-CM

## 2024-06-08 ENCOUNTER — Ambulatory Visit (HOSPITAL_COMMUNITY)
Admission: RE | Admit: 2024-06-08 | Discharge: 2024-06-08 | Disposition: A | Discharge status: Home / Self Care | Source: Ambulatory Visit | Attending: Internal Medicine | Admitting: Internal Medicine

## 2024-06-08 ENCOUNTER — Inpatient Hospital Stay: Payer: BC Managed Care – PPO | Attending: Internal Medicine

## 2024-06-08 DIAGNOSIS — Z85118 Personal history of other malignant neoplasm of bronchus and lung: Secondary | ICD-10-CM | POA: Insufficient documentation

## 2024-06-08 DIAGNOSIS — C3490 Malignant neoplasm of unspecified part of unspecified bronchus or lung: Secondary | ICD-10-CM | POA: Insufficient documentation

## 2024-06-08 DIAGNOSIS — Z902 Acquired absence of lung [part of]: Secondary | ICD-10-CM | POA: Insufficient documentation

## 2024-06-08 DIAGNOSIS — R918 Other nonspecific abnormal finding of lung field: Secondary | ICD-10-CM | POA: Insufficient documentation

## 2024-06-08 DIAGNOSIS — Z79899 Other long term (current) drug therapy: Secondary | ICD-10-CM | POA: Insufficient documentation

## 2024-06-08 LAB — CBC WITH DIFFERENTIAL (CANCER CENTER ONLY)
Abs Immature Granulocytes: 0.02 K/uL (ref 0.00–0.07)
Basophils Absolute: 0 K/uL (ref 0.0–0.1)
Basophils Relative: 1 %
Eosinophils Absolute: 0.1 K/uL (ref 0.0–0.5)
Eosinophils Relative: 2 %
HCT: 40.4 % (ref 36.0–46.0)
Hemoglobin: 13.7 g/dL (ref 12.0–15.0)
Immature Granulocytes: 0 %
Lymphocytes Relative: 22 %
Lymphs Abs: 1.8 K/uL (ref 0.7–4.0)
MCH: 27.3 pg (ref 26.0–34.0)
MCHC: 33.9 g/dL (ref 30.0–36.0)
MCV: 80.5 fL (ref 80.0–100.0)
Monocytes Absolute: 0.5 K/uL (ref 0.1–1.0)
Monocytes Relative: 7 %
Neutro Abs: 5.7 K/uL (ref 1.7–7.7)
Neutrophils Relative %: 68 %
Platelet Count: 302 K/uL (ref 150–400)
RBC: 5.02 MIL/uL (ref 3.87–5.11)
RDW: 13.3 % (ref 11.5–15.5)
WBC Count: 8.2 K/uL (ref 4.0–10.5)
nRBC: 0 % (ref 0.0–0.2)

## 2024-06-08 LAB — CMP (CANCER CENTER ONLY)
ALT: 10 U/L (ref 0–44)
AST: 14 U/L — ABNORMAL LOW (ref 15–41)
Albumin: 4.2 g/dL (ref 3.5–5.0)
Alkaline Phosphatase: 72 U/L (ref 38–126)
Anion gap: 7 (ref 5–15)
BUN: 16 mg/dL (ref 6–20)
CO2: 28 mmol/L (ref 22–32)
Calcium: 9.7 mg/dL (ref 8.9–10.3)
Chloride: 103 mmol/L (ref 98–111)
Creatinine: 0.79 mg/dL (ref 0.44–1.00)
GFR, Estimated: >60 mL/min (ref >60)
Glucose, Bld: 96 mg/dL (ref 70–99)
Potassium: 4.1 mmol/L (ref 3.5–5.1)
Sodium: 138 mmol/L (ref 135–145)
Total Bilirubin: 1.5 mg/dL — ABNORMAL HIGH (ref 0.0–1.2)
Total Protein: 7.1 g/dL (ref 6.5–8.1)

## 2024-06-08 MED ORDER — IOHEXOL 300 MG/ML  SOLN
75.0000 mL | Freq: Once | INTRAMUSCULAR | Status: AC | PRN
  Administered 2024-06-08: 75 mL via INTRAVENOUS

## 2024-06-14 ENCOUNTER — Inpatient Hospital Stay: Payer: BC Managed Care – PPO | Admitting: Internal Medicine

## 2024-06-14 VITALS — BP 137/79 | HR 80 | Temp 97.7°F | Resp 17 | Ht 61.0 in | Wt 192.0 lb

## 2024-06-14 DIAGNOSIS — C3490 Malignant neoplasm of unspecified part of unspecified bronchus or lung: Secondary | ICD-10-CM

## 2024-06-14 NOTE — Progress Notes (Signed)
 [facility] [phone]   [phone]  OFFICE PROGRESS NOTE  Rilla Baller, MD [address] [place] [address]  DIAGNOSIS: Stage IA (T1a, N0, M0) non-small cell lung cancer, adenocarcinoma presented with right upper lobe pulmonary nodule diagnosed in November 2017.  PRIOR THERAPY:status post right upper lobectomy with lymph node dissection in November 2017 under the care of Dr. Kerrin.  CURRENT THERAPY: Observation.  INTERVAL HISTORY: Monica [name] 58 y.o. female returns to the clinic today for annual follow-up visit.Discussed the use of AI scribe software for clinical note transcription with the patient, who gave verbal consent to proceed.  History of Present Illness Monica [name] is a 58 year old female with stage 1a nonsmall cell lung cancer who presents for evaluation and repeat CT scan of the chest.  She has a history of stage 1a nonsmall cell lung cancer, adenocarcinoma, diagnosed in November 2017. She underwent a right upper lobectomy with lymph node dissection and is currently in an observation period. No new changes in her health have been noted since her last visit a year ago. She experiences a cough that 'comes and goes', especially after being sick, but it eventually resolves. Pulmonary nodules have been noted on prior imaging and are being monitored.  She inquired about atherosclerosis noted in her previous scans and mentioned that she is not diabetic, although she was previously borderline. She has noticed an improvement in her blood sugar levels after changing her diet and increasing her exercise. She has been exercising daily for nine weeks at a local [employer] facility.  Her sister, who is two years older, tested positive for carrying the lung cancer gene. Her sister has undergone her first CT scan and will be followed up accordingly.  No new chest pain or breathing difficulties.    MEDICAL HISTORY: Past  Medical History:  Diagnosis Date   Allergy    seasonal   Essential hypertension    History of chicken pox    Hyperbilirubinemia 01/19/2017   Hyperlipidemia    Hypothyroidism    Primary adenocarcinoma of upper lobe of right lung (HCC) 05/28/2016   RUL apex spiculated ~2cm nodule concerning by PET scan - referred to multidisciplinary thoracic oncology clinic S/p thoracoscopic RULobectomy Arnaldo) 06/2016   Sarcoidosis, lung ~2004   treated with prendisone and stable since then    ALLERGIES:  has no known allergies.  MEDICATIONS:  Current Outpatient Medications  Medication Sig Dispense Refill   acetaminophen  (TYLENOL ) 325 MG tablet Take 650 mg by mouth every 6 (six) hours as needed.     albuterol  (VENTOLIN  HFA) 108 (90 Base) MCG/ACT inhaler Inhale 2 puffs into the lungs every 6 (six) hours as needed for wheezing or shortness of breath. 18 g 3   Cholecalciferol  (VITAMIN D3) 50 MCG (2000 UT) capsule Take 2 capsules (4,000 Units total) by mouth daily.     fluticasone  (FLONASE ) 50 MCG/ACT nasal spray Place 1 spray into both nostrils daily as needed for allergies. 16 g 0   levothyroxine  (SYNTHROID ) 75 MCG tablet TAKE 1 TABLET(75 MCG) BY MOUTH DAILY BEFORE BREAKFAST 90 tablet 0   loratadine (CLARITIN) 10 MG tablet 1 tablet Orally as needed     losartan -hydrochlorothiazide  (HYZAAR) 50-12.5 MG tablet Take 1 tablet by mouth daily. 90 tablet 3   pantoprazole  (PROTONIX ) 40 MG tablet Take 1 tablet (40 mg total) by mouth daily as needed (reflux). 30 tablet 3   Red Yeast Rice 600 MG CAPS  Take 1 capsule (600 mg total) by mouth in the morning and at bedtime.     Spacer/Aero-Holding Raguel FRENCH Use as directed with albuterol  inhaler 1 Units 0   valACYclovir  (VALTREX ) 1000 MG tablet Take 2 tablets (2,000 mg total) by mouth in the morning and at bedtime. For 1 day per flare 12 tablet 1   No current facility-administered medications for this visit.    SURGICAL HISTORY:  Past Surgical History:   Procedure Laterality Date   CARDIOVASCULAR STRESS TEST  12/2016   Nuclear CP stress test: hypertensive response to exercise, normal low risk study, EF 68%   COLONOSCOPY  09/2015   WNL Robertha)   COLONOSCOPY  11/2019   SSP, hemorrhoids, diverticulosis, rpt 7 yrs ([place])   ENDOMETRIAL ABLATION W/ NOVASURE  01/2009   [facility] 12/02/2010   ESOPHAGOGASTRODUODENOSCOPY  11/2019   LA Grade A reflux esophagitis, gastric ulcer, gastritis, HH ([place])   LOBECTOMY Right 06/14/2016   RU Lobectomy; Surgeon: Elspeth JAYSON Millers, MD   LYMPH NODE DISSECTION Right 06/14/2016   RIGHT UPPER LOBE LYMPH NODE DISSECTION;  Surgeon: Elspeth JAYSON Millers, MD   REFRACTIVE SURGERY Bilateral 1998   TUBAL LIGATION  2001   VIDEO ASSISTED THORACOSCOPY (VATS)/WEDGE RESECTION Right 06/14/2016   Surgeon: Elspeth JAYSON Millers, MD    REVIEW OF SYSTEMS:  A comprehensive review of systems was negative except for: Respiratory: positive for cough   PHYSICAL EXAMINATION: General appearance: alert, cooperative, and no distress Head: Normocephalic, without obvious abnormality, atraumatic Neck: no adenopathy, no JVD, supple, symmetrical, trachea midline, and thyroid  not enlarged, symmetric, no tenderness/mass/nodules Lymph nodes: Cervical, supraclavicular, and axillary nodes normal. Resp: clear to auscultation bilaterally Back: symmetric, no curvature. ROM normal. No CVA tenderness. Cardio: regular rate and rhythm, S1, S2 normal, no murmur, click, rub or gallop GI: soft, non-tender; bowel sounds normal; no masses,  no organomegaly Extremities: extremities normal, atraumatic, no cyanosis or edema  ECOG PERFORMANCE STATUS: 0 - Asymptomatic  Blood pressure 137/79, pulse 80, temperature 97.7 F (36.5 C), temperature source Temporal, resp. rate 17, height 5' 1 (1.549 m), weight 192 lb (87.1 kg), SpO2 100%.  LABORATORY DATA: Lab Results  Component Value Date   WBC 8.2 06/08/2024   HGB 13.7 06/08/2024   HCT 40.4  06/08/2024   MCV 80.5 06/08/2024   PLT 302 06/08/2024      Chemistry      Component Value Date/Time   NA 138 06/08/2024 1035   NA 140 07/20/2017 1338   K 4.1 06/08/2024 1035   K 4.1 07/20/2017 1338   CL 103 06/08/2024 1035   CO2 28 06/08/2024 1035   CO2 26 07/20/2017 1338   BUN 16 06/08/2024 1035   BUN 13.4 07/20/2017 1338   CREATININE 0.79 06/08/2024 1035   CREATININE 0.8 07/20/2017 1338      Component Value Date/Time   CALCIUM  9.7 06/08/2024 1035   CALCIUM  9.7 07/20/2017 1338   ALKPHOS 72 06/08/2024 1035   ALKPHOS 77 07/20/2017 1338   AST 14 (L) 06/08/2024 1035   AST 15 07/20/2017 1338   ALT 10 06/08/2024 1035   ALT 12 07/20/2017 1338   BILITOT 1.5 (H) 06/08/2024 1035   BILITOT 1.71 (H) 07/20/2017 1338       RADIOGRAPHIC STUDIES: CT Chest W Contrast Result Date: 06/11/2024 CLINICAL DATA:  Non-small-cell lung cancer restaging, status post right upper lobectomy * Tracking Code: BO * EXAM: CT CHEST WITH CONTRAST TECHNIQUE: Multidetector CT imaging of the chest was performed during intravenous contrast administration.  RADIATION DOSE REDUCTION: This exam was performed according to the departmental dose-optimization program which includes automated exposure control, adjustment of the mA and/or kV according to patient size and/or use of iterative reconstruction technique. CONTRAST:  75mL OMNIPAQUE  IOHEXOL  300 MG/ML  SOLN COMPARISON:  06/10/2023 FINDINGS: Cardiovascular: No significant vascular findings. Normal heart size. Scattered left coronary artery calcifications. No pericardial effusion. Mediastinum/Nodes: Multiple unchanged prominent prevascular and hilar lymph nodes (series 2, image 53, 70) no overtly enlarged mediastinal, hilar, or axillary lymph nodes. Small hiatal hernia. Thyroid  gland, trachea, and esophagus demonstrate no significant findings. Lungs/Pleura: Unchanged postoperative appearance of the chest status post right upper lobectomy. Numerous small bilateral  ground-glass nodules are unchanged, for example a 0.4 cm nodule in the superior segment left lower lobe (series 8, image 67) and a 0.4 cm nodule in the dependent right lower lobe (series 8, image 88). No pleural effusion or pneumothorax. Upper Abdomen: No acute abnormality. Musculoskeletal: No chest wall abnormality. No acute osseous findings. IMPRESSION: 1. Unchanged postoperative appearance of the chest status post right upper lobectomy. 2. Numerous small bilateral ground-glass nodules are unchanged, nonspecific although stable on multiple prior examinations and presumably benign. Continued attention on follow-up. 3. Multiple unchanged prominent prevascular and bilateral hilar lymph nodes. No overt lymphadenopathy. Continued attention on follow-up. 4. Coronary artery disease. Electronically Signed   By: Marolyn JONETTA Jaksch M.D.   On: 06/11/2024 15:46     ASSESSMENT AND PLAN: This is a very pleasant 58 years old white female with stage IA non-small cell lung cancer, adenocarcinoma presented with right upper lobe pulmonary nodule is status post right upper lobectomy with lymph node dissection in November 2017 under the care of Dr. Kerrin. The patient has been on observation since that time and she is feeling fine. She had repeat CT scan of the chest performed recently.  I personally independently reviewed the scan and discussed the result with the patient today.  Her scan showed no concerning findings for disease progression. Assessment and Plan Assessment & Plan History of stage 1A right upper lobe lung adenocarcinoma, post-lobectomy, under observation Stage 1A nonsmall cell lung cancer, adenocarcinoma, diagnosed in November 2017. Status post right upper lobectomy with lymph node dissection. Currently under observation with no new symptoms or changes in health. Recent CT scan shows no concerning issues. - Continue observation with annual CT scans.  Pulmonary nodules under surveillance Pulmonary nodules  are stable with no changes on recent imaging. Continued surveillance is necessary due to history of lung cancer. - Continue surveillance of pulmonary nodules with regular imaging. She was advised to call immediately if she has any concerning symptoms in the interval.  The patient voices understanding of current disease status and treatment options and is in agreement with the current care plan. All questions were answered. The patient knows to call the clinic with any problems, questions or concerns. We can certainly see the patient much sooner if necessary.  Disclaimer: This note was dictated with voice recognition software. Similar sounding words can inadvertently be transcribed and may not be corrected upon review.

## 2024-07-04 ENCOUNTER — Other Ambulatory Visit: Payer: Self-pay | Admitting: Family Medicine

## 2024-07-04 ENCOUNTER — Other Ambulatory Visit: Payer: Self-pay

## 2024-07-04 ENCOUNTER — Inpatient Hospital Stay
Admission: RE | Admit: 2024-07-04 | Discharge: 2024-07-04 | Attending: Obstetrics and Gynecology | Admitting: Obstetrics and Gynecology

## 2024-07-04 DIAGNOSIS — E039 Hypothyroidism, unspecified: Secondary | ICD-10-CM

## 2024-07-04 DIAGNOSIS — Z803 Family history of malignant neoplasm of breast: Secondary | ICD-10-CM

## 2024-07-04 MED ORDER — GADOPICLENOL 0.5 MMOL/ML IV SOLN
9.0000 mL | Freq: Once | INTRAVENOUS | Status: AC | PRN
  Administered 2024-07-04: 9 mL via INTRAVENOUS

## 2024-07-05 ENCOUNTER — Other Ambulatory Visit: Payer: Self-pay

## 2024-08-04 ENCOUNTER — Other Ambulatory Visit: Payer: Self-pay | Admitting: Family Medicine

## 2025-03-08 ENCOUNTER — Other Ambulatory Visit

## 2025-03-15 ENCOUNTER — Encounter: Admitting: Family Medicine

## 2025-06-14 ENCOUNTER — Inpatient Hospital Stay

## 2025-06-20 ENCOUNTER — Inpatient Hospital Stay: Admitting: Internal Medicine
